# Patient Record
Sex: Female | Born: 1975
Health system: Southern US, Community
[De-identification: ages and names within clinical notes are randomized; demographics above are authoritative.]

## PROBLEM LIST (undated history)

## (undated) DIAGNOSIS — I10 Essential (primary) hypertension: Secondary | ICD-10-CM

## (undated) DIAGNOSIS — R7303 Prediabetes: Secondary | ICD-10-CM

## (undated) HISTORY — DX: Essential (primary) hypertension: I10

## (undated) HISTORY — PX: TUBAL LIGATION: SHX77

---

## 1998-02-16 ENCOUNTER — Other Ambulatory Visit: Admission: RE | Admit: 1998-02-16 | Discharge: 1998-02-16 | Payer: Self-pay | Admitting: Obstetrics and Gynecology

## 1998-04-10 ENCOUNTER — Other Ambulatory Visit: Admission: RE | Admit: 1998-04-10 | Discharge: 1998-04-10 | Payer: Self-pay | Admitting: Obstetrics and Gynecology

## 1998-10-19 ENCOUNTER — Other Ambulatory Visit: Admission: RE | Admit: 1998-10-19 | Discharge: 1998-10-19 | Payer: Self-pay | Admitting: Obstetrics and Gynecology

## 1999-03-06 ENCOUNTER — Inpatient Hospital Stay (HOSPITAL_COMMUNITY): Admission: AD | Admit: 1999-03-06 | Discharge: 1999-03-06 | Payer: Self-pay | Admitting: Obstetrics and Gynecology

## 1999-05-02 ENCOUNTER — Inpatient Hospital Stay (HOSPITAL_COMMUNITY): Admission: AD | Admit: 1999-05-02 | Discharge: 1999-05-02 | Payer: Self-pay | Admitting: Obstetrics and Gynecology

## 1999-05-17 ENCOUNTER — Encounter (INDEPENDENT_AMBULATORY_CARE_PROVIDER_SITE_OTHER): Payer: Self-pay | Admitting: Specialist

## 1999-05-17 ENCOUNTER — Inpatient Hospital Stay (HOSPITAL_COMMUNITY): Admission: AD | Admit: 1999-05-17 | Discharge: 1999-05-19 | Payer: Self-pay | Admitting: Obstetrics and Gynecology

## 1999-05-21 ENCOUNTER — Encounter (HOSPITAL_COMMUNITY): Admission: RE | Admit: 1999-05-21 | Discharge: 1999-08-19 | Payer: Self-pay | Admitting: Obstetrics & Gynecology

## 1999-06-21 ENCOUNTER — Other Ambulatory Visit: Admission: RE | Admit: 1999-06-21 | Discharge: 1999-06-21 | Payer: Self-pay | Admitting: Obstetrics and Gynecology

## 2000-07-21 ENCOUNTER — Other Ambulatory Visit: Admission: RE | Admit: 2000-07-21 | Discharge: 2000-07-21 | Payer: Self-pay | Admitting: Obstetrics and Gynecology

## 2001-03-22 ENCOUNTER — Other Ambulatory Visit: Admission: RE | Admit: 2001-03-22 | Discharge: 2001-03-22 | Payer: Self-pay | Admitting: Obstetrics and Gynecology

## 2001-07-22 ENCOUNTER — Ambulatory Visit (HOSPITAL_COMMUNITY): Admission: RE | Admit: 2001-07-22 | Discharge: 2001-07-22 | Payer: Self-pay | Admitting: Obstetrics and Gynecology

## 2001-10-05 ENCOUNTER — Inpatient Hospital Stay (HOSPITAL_COMMUNITY): Admission: AD | Admit: 2001-10-05 | Discharge: 2001-10-08 | Payer: Self-pay | Admitting: Obstetrics and Gynecology

## 2001-10-05 ENCOUNTER — Encounter (INDEPENDENT_AMBULATORY_CARE_PROVIDER_SITE_OTHER): Payer: Self-pay | Admitting: Specialist

## 2001-12-07 ENCOUNTER — Other Ambulatory Visit: Admission: RE | Admit: 2001-12-07 | Discharge: 2001-12-07 | Payer: Self-pay | Admitting: Obstetrics and Gynecology

## 2002-05-26 ENCOUNTER — Encounter: Admission: RE | Admit: 2002-05-26 | Discharge: 2002-05-26 | Payer: Self-pay | Admitting: Occupational Medicine

## 2002-05-26 ENCOUNTER — Encounter: Payer: Self-pay | Admitting: Occupational Medicine

## 2002-07-11 ENCOUNTER — Encounter: Admission: RE | Admit: 2002-07-11 | Discharge: 2002-09-05 | Payer: Self-pay | Admitting: Orthopedic Surgery

## 2002-12-20 ENCOUNTER — Other Ambulatory Visit: Admission: RE | Admit: 2002-12-20 | Discharge: 2002-12-20 | Payer: Self-pay | Admitting: Obstetrics and Gynecology

## 2004-01-10 ENCOUNTER — Other Ambulatory Visit: Admission: RE | Admit: 2004-01-10 | Discharge: 2004-01-10 | Payer: Self-pay | Admitting: Obstetrics and Gynecology

## 2004-10-16 ENCOUNTER — Other Ambulatory Visit: Admission: RE | Admit: 2004-10-16 | Discharge: 2004-10-16 | Payer: Self-pay | Admitting: Obstetrics and Gynecology

## 2004-11-12 ENCOUNTER — Ambulatory Visit: Payer: Self-pay | Admitting: Internal Medicine

## 2005-04-15 ENCOUNTER — Other Ambulatory Visit: Admission: RE | Admit: 2005-04-15 | Discharge: 2005-04-15 | Payer: Self-pay | Admitting: Obstetrics and Gynecology

## 2005-07-01 ENCOUNTER — Ambulatory Visit: Payer: Self-pay | Admitting: Internal Medicine

## 2005-09-10 ENCOUNTER — Ambulatory Visit: Payer: Self-pay | Admitting: Internal Medicine

## 2005-11-03 ENCOUNTER — Ambulatory Visit: Payer: Self-pay | Admitting: Internal Medicine

## 2005-12-10 ENCOUNTER — Other Ambulatory Visit: Admission: RE | Admit: 2005-12-10 | Discharge: 2005-12-10 | Payer: Self-pay | Admitting: Obstetrics and Gynecology

## 2006-07-26 ENCOUNTER — Emergency Department (HOSPITAL_COMMUNITY): Admission: EM | Admit: 2006-07-26 | Discharge: 2006-07-26 | Payer: Self-pay | Admitting: Emergency Medicine

## 2006-07-30 ENCOUNTER — Ambulatory Visit (HOSPITAL_COMMUNITY): Admission: RE | Admit: 2006-07-30 | Discharge: 2006-07-30 | Payer: Self-pay | Admitting: Urology

## 2006-07-30 ENCOUNTER — Emergency Department (HOSPITAL_COMMUNITY): Admission: EM | Admit: 2006-07-30 | Discharge: 2006-07-31 | Payer: Self-pay | Admitting: Emergency Medicine

## 2006-08-07 ENCOUNTER — Ambulatory Visit (HOSPITAL_COMMUNITY): Admission: RE | Admit: 2006-08-07 | Discharge: 2006-08-07 | Payer: Self-pay | Admitting: Urology

## 2007-10-30 ENCOUNTER — Emergency Department (HOSPITAL_COMMUNITY): Admission: EM | Admit: 2007-10-30 | Discharge: 2007-10-30 | Payer: Self-pay | Admitting: Emergency Medicine

## 2009-11-06 ENCOUNTER — Emergency Department (HOSPITAL_COMMUNITY): Admission: EM | Admit: 2009-11-06 | Discharge: 2009-11-06 | Payer: Self-pay | Admitting: Emergency Medicine

## 2009-12-05 ENCOUNTER — Emergency Department (HOSPITAL_COMMUNITY): Admission: EM | Admit: 2009-12-05 | Discharge: 2009-12-05 | Payer: Self-pay | Admitting: Emergency Medicine

## 2010-07-02 LAB — HM PAP SMEAR: HM Pap smear: NORMAL

## 2010-11-18 ENCOUNTER — Emergency Department (HOSPITAL_COMMUNITY): Payer: Self-pay

## 2010-11-18 ENCOUNTER — Emergency Department (HOSPITAL_COMMUNITY)
Admission: EM | Admit: 2010-11-18 | Discharge: 2010-11-18 | Disposition: A | Discharge status: Home / Self Care | Payer: Self-pay | Attending: Emergency Medicine | Admitting: Emergency Medicine

## 2010-11-18 DIAGNOSIS — S93409A Sprain of unspecified ligament of unspecified ankle, initial encounter: Secondary | ICD-10-CM | POA: Insufficient documentation

## 2010-11-18 DIAGNOSIS — X500XXA Overexertion from strenuous movement or load, initial encounter: Secondary | ICD-10-CM | POA: Insufficient documentation

## 2010-11-18 DIAGNOSIS — I10 Essential (primary) hypertension: Secondary | ICD-10-CM | POA: Insufficient documentation

## 2010-11-18 DIAGNOSIS — M25579 Pain in unspecified ankle and joints of unspecified foot: Secondary | ICD-10-CM | POA: Insufficient documentation

## 2010-11-18 DIAGNOSIS — Y92009 Unspecified place in unspecified non-institutional (private) residence as the place of occurrence of the external cause: Secondary | ICD-10-CM | POA: Insufficient documentation

## 2011-02-07 NOTE — Op Note (Signed)
Jenny Parsons, Jenny Parsons          ACCOUNT NO.:  [id number]   MEDICAL RECORD NO.:  [id number]          PATIENT TYPE:  AMB   LOCATION:  DAY                          FACILITY:  [facility]   PHYSICIAN:  Lindaann Slough, M.D.  DATE OF BIRTH:  11/29/1975   DATE OF PROCEDURE:  08/07/2006  DATE OF DISCHARGE:                               OPERATIVE REPORT   PREOPERATIVE DIAGNOSIS:  Right ureteral stones.   POSTOP DIAGNOSIS:  Right ureteral stones.   PROCEDURE:  Cystoscopy, ureteroscopy, Holmium laser right ureteral  stone, stone extraction, retrograde pyelogram, and insertion of double-J  catheter.   SURGEON:  Dr. Brunilda Payor.   ANESTHESIA:  General.   DATE OF PROCEDURE:  August 07, 2006.   INDICATIONS:  The patient is 66 year of female who had ESL of a right  UPJ stone on 26-Aug-2006.  She has passed several small stone  fragments, however, she has been complaining of right flank and right  lower quadrant pain.  She has been taking analgesics on a daily basis  and cannot go back to work.  KUB shows a stone fragment at the right UVJ  and another stone fragment at the UPJ.  KUB done this morning showed a  stone fragment at the UVJ and the stone at the UPJ has migrated down to  the level of the upper cecum.  She is scheduled for cystoscopy and stone  manipulation.   Under general anesthesia the patient was prepped and draped and placed  in the dorsal lithotomy position.  A #22 WAPPLER cystoscope was inserted  in the bladder.  There is some edema at the right UVJ.  There is no  stone or tumor in the bladder.  An open-ended catheter was passed  through the cystoscope and a glide wire was passed through the open-  ended catheter into the ureter.  The cystoscope was then removed.  The  glide wire was used as a safety wire. A number 6.5 French rigid  ureteroscope was then inserted in the bladder, but could not be passed  through the ureteral orifice.  The ureteroscope was then removed.  The  intramural ureter was then dilated with the inner sheath of the  ureteroscope access sheath.  The ureteroscope access sheath was then  removed.  The ureteroscope was then reinserted in the bladder and passed  without difficulty in the ureter.  There is a stone in the distal  ureter. The stone was removed with the nitinol stone basket.  The  ureteroscope was then passed up to the mid ureter and the proximal stone  was visualized and then fragmented in multiple stone fragments.  Several  stone fragments were removed with the nitinol basket.   RETROGRADE PYELOGRAM:  Contrast was then injected through the  ureteroscope and the calices and proximal ureter are mildly dilated.  There is no evidence of extravasation of contrast.  There are some small  filling defects in the mid ureter that represents some small stone  fragments.  With the nitinol basket several minute fragments were  removed.   The ureteroscope was then removed.  During the removal of  the  ureteroscope the glide wire came out of the ureter.  The cystoscope was  then inserted in the bladder.  The open-ended catheter was passed  through the cystoscope and into the right ureteral orifice and then the  Glidewire was passed through the open-ended catheter up into the renal  pelvis.  The open-ended catheter was advanced over the Glidewire into  the renal pelvis and the Glidewire was removed and replaced with a guide  wire.  The open-ended catheter was then removed.  A #6-French - 24  double-J catheter was then passed over the guidewire.  The proximal curl  of the double-J catheter is in the renal pelvis.  The distal curl is in  the bladder.  The bladder was then emptied and the cystoscope and guide  wire were removed.   The patient tolerated the procedure well and left the OR in satisfactory  condition to the post anesthesia care unit.      Lindaann Slough, M.D.  Electronically Signed     MN/MEDQ  D:  08/07/2006  T:   08/07/2006  Job:  [id number]

## 2011-02-07 NOTE — Discharge Summary (Signed)
[facility]  Patient:    Jenny Parsons, Jenny Parsons Visit Number: [id number] MRN: [id number]          Service Type: OBS Location: 9300 [id number] Attending Physician:  Frederich Balding Dictated by:   Danie Chandler, R.N. Admit Date:  10/05/2001 Discharge Date: 10/08/2001                             Discharge Summary  ADMISSION DIAGNOSES:          1. Intrauterine pregnancy at [redacted] weeks                                  gestation.                               2. Desires primary cesarean section.                               3. Past history of infant with Group B Strep                                  sepsis and meconium aspiration.                               4. Multiparity, desires sterility.  DISCHARGE DIAGNOSES:          1. Intrauterine pregnancy at [redacted] weeks                                  gestation.                               2. Desires primary cesarean section.                               3. Past history of infant with Group B Strep                                  sepsis and meconium aspiration.                               4. Multiparity, desires sterility.  PROCEDURE October 05, 2001:   1. Primary low transverse cesarean section.                               2. Bilateral tubal ligation.  REASON FOR ADMISSION:         Please see dictated H&P.  HOSPITAL COURSE:             The patient was taken to the operating room and underwent the above-named procedure without complications.  This was productive of a viable female infant with Apgars of 9 at one minute and 9 at five minute and an arterial cord pH of 7.34.  Postoperatively, on day #1, the patient was without complaints.  She had good pain control and good return of bowel function.  Her hemoglobin was 10.1, hematocrit 28.6 and white blood cell count 8.9.  On postoperative day #2, she was ambulating well without difficulty and tolerating a regular diet and she was discharged home  on postoperative day #3.  CONDITION ON DISCHARGE:       Good.  DIET:                         Regular as tolerated.  ACTIVITY:                     No heavy lifting, no driving, no vaginal entry.  FOLLOW-UP:                    She is to follow up in the office in 1-2 weeks for incision check and she is to call for temperature greater than 100 degrees, persistent nausea or vomiting, heavy vaginal bleeding and/or redness or drainage from incision site.  DISCHARGE MEDICATIONS:        1. Prenatal vitamin, one p.o. q.d.                               2. Pain medications as directed by M.D.Dictated by:   Danie Chandler, R.N. Attending Physician:  Frederich Balding DD:  10/08/01 TD:  10/10/01 Job: [id number]

## 2011-02-07 NOTE — H&P (Signed)
[facility]  Patient:    Jenny Parsons, Jenny Parsons Visit Number: [id number] MRN: [id number]          Service Type: Attending:  Juluis Mire, M.D. Dictated by:   Juluis Mire, M.D. Adm. Date:  10/05/01                           History and Physical  CHIEF COMPLAINT:              This is a 35 year old gravida 2 para 1 married white female, last menstrual period January 04, 2001, giving her an estimated date of confinement of October 11, 2001.  This gives her an estimated gestational age of [redacted] weeks.  This is consistent with early initial examination as well as early ultrasound.  She presents now for primary cesarean section.  HISTORY OF PRESENT ILLNESS:   In relation to the present admission, the last pregnancy was complicated by the fact that the baby had group B sepsis and meconium aspiration.  The infant was transferred to [facility] and did require ECMO.  The infant has done well since.  Because of this problem she desires primary cesarean section.  We offered the option of trial of labor, which she adamantly declines.  She is also desirous of permanent sterilization.  Alternative forms of birth control have been discussed.  The potential irreversibility of sterilization was explained.  A failure rate of 1:200 was quoted.  Failures can be in the form of ectopic pregnancy requiring further surgical management.  ALLERGIES:                    No known drug allergies.  MEDICATIONS:                  Prenatal vitamins.  PAST MEDICAL HISTORY/FAMILY HISTORY/SOCIAL HISTORY:       Please see prenatal records.  REVIEW OF SYSTEMS:            Noncontributory.  PHYSICAL EXAMINATION:  VITAL SIGNS:                  The patient is afebrile with stable vital signs.  HEENT:                        Normocephalic.  PERRLA.  EOMI.  Sclerae and conjunctivae clear.  Oropharynx clear.  NECK:                         Without  thyromegaly.  BREAST:                       No discrete masses but glandular.  LUNGS:                        Clear.  CARDIOVASCULAR:               Regular rate with grade 2/6 systolic ejection murmur.  No clicks of gallops.  ABDOMEN:                      Gravid uterus consistent with dates.  Fetal heart tones audible.  PELVIC:                       Cervix is long and closed.  EXTREMITIES:  Trace edema.  NEUROLOGIC:                   Grossly within normal limits.  IMPRESSION:                   1. Intrauterine pregnancy at 39+ weeks.  The                                  patient desires primary cesarean section.                               2. History with prior pregnancy of group B                                  streptococci sepsis and meconium aspiration.                               3. Multiparity.  Desires sterility.  PLAN:                         The patient is to undergo primary cesarean section at her request.  The risks have been discussed including the risks of anesthesia, the risks of infection, the risk of hemorrhage that could require transfusion with the risk of AIDS or hepatitis, the risk of injury to adjacent organs including bladder, bowel or ureters that could require further exploratory surgery, risk of deep vein thrombosis and pulmonary embolus.  The patient expressed understanding of the indications and risks and continues to be desirous of permanent sterilization. Dictated by:   Juluis Mire, M.D. Attending:  Juluis Mire, M.D. DD:  10/05/01 TD:  10/05/01 Job: [id number]

## 2011-02-07 NOTE — Op Note (Signed)
[facility]  Patient:    Jenny Parsons, Jenny Parsons Visit Number: [id number] MRN: [id number]          Service Type: OBS Location: 9300 [id number] Attending Physician:  Frederich Balding Dictated by:   Juluis Mire, M.D. Proc. Date: 10/05/01 Admit Date:  10/05/2001                             Operative Report  PREOPERATIVE DIAGNOSES:       1. Intrauterine pregnancy at 39 weeks.                               2. Desires primary cesarean section.                               3. Past history of infant with group B strep                                  sepsis and meconium aspiration.                               4. Multiparity, desires sterility.  POSTOPERATIVE DIAGNOSES:      1. Intrauterine pregnancy at 39 weeks.                               2. Desires primary cesarean section.                               3. Past history of infant with group B strep                                  sepsis and meconium aspiration.                               4. Multiparity, desires sterility.  OPERATIVE PROCEDURES:         1. Primary low transverse cesarean section.                               2. Bilateral tubal ligation.  SURGEON:                      Juluis Mire, M.D.  ANESTHESIA:                   Spinal.  ESTIMATED BLOOD LOSS:         800 cc.  PACKS AND DRAINS:             None.  INTRAOPERATIVE BLOOD REPLACED:               None.  COMPLICATIONS:                None.  INDICATIONS:                  Dictated in the history and physical.  DESCRIPTION OF PROCEDURE:  The patient was taken to the OR and placed in the supine position with a left lateral tilt.  After a satisfactory level of spinal anesthesia was obtained, the abdomen was prepped out with Betadine and draped as a sterile field.  A low transverse skin incision was made with a knife and carried through the subcutaneous tissue.  The anterior rectus fascia was entered sharply and the  incision in the fascia extended laterally.  The fascia was taken off of the muscle superiorly and inferiorly.  The rectus muscles were separated in the midline.  The peritoneum was entered sharply. The incision in the peritoneum was extended both superiorly and inferiorly.  A low transverse bladder flap was developed.  A low transverse uterine incision was begun with the knife and extended laterally using manual traction.  There was very light-stained meconium fluid.  It was very thin.  The infant was delivered with elevation of the had and fundal pressure.  The pediatric team was in attendance.  The infant was  viable female who weighed 6 lb 11 oz. Apgars were 9/9 and pH was 7.34.  The placenta was delivered manually and sent for pathologic review.  The uterus was exteriorized for closure.  The uterus was closed with interlocking suture of 0 chromic using a two-layer closure technique.  We had fair hemostasis.  Some bleeding was brought under control with figure-of-eight of 0 chromic.  At this point in time, we proceeded with the tubal.  Both tubes were identified.  A midsegment was elevated.  A hole was made in an avascular area of the mesosalpinx. Individual ligatures of 0 plain catgut were used to ligate off a segment of tube.  The intervening segment of tube was excised.  The cut ends of the tubes were cauterized using the Bovie.  We had good hemostasis bilaterally.  The ovaries were unremarkable.  The uterus was returned to the abdominal cavity. The pelvic cavity was thoroughly irrigated.  Hemostasis was excellent.  At this point in time, the peritoneum and muscles were closed with running suture of 3-0 Vicryl.  The fascia was closed with a running suture of 0 PDS.  The skin was closed with staples and Steri-Strips.  Sponge, needle and instrument counts were reported as correct by the circulating nurse x 2.  The Foley catheter was clear at the time of closure.  The patient tolerated  the procedure well and was returned to the recovery room in good condition. Dictated by:   Juluis Mire, M.D. Attending Physician:  Frederich Balding DD:  10/05/01 TD:  10/05/01 Job: [id number]

## 2011-05-01 DIAGNOSIS — I10 Essential (primary) hypertension: Secondary | ICD-10-CM | POA: Insufficient documentation

## 2011-11-19 ENCOUNTER — Ambulatory Visit: Payer: Self-pay | Admitting: Internal Medicine

## 2011-12-22 ENCOUNTER — Ambulatory Visit (INDEPENDENT_AMBULATORY_CARE_PROVIDER_SITE_OTHER)
Admission: RE | Admit: 2011-12-22 | Discharge: 2011-12-22 | Disposition: A | Discharge status: Home / Self Care | Payer: 59 | Source: Ambulatory Visit | Attending: Internal Medicine | Admitting: Internal Medicine

## 2011-12-22 ENCOUNTER — Ambulatory Visit (INDEPENDENT_AMBULATORY_CARE_PROVIDER_SITE_OTHER): Payer: 59 | Admitting: Internal Medicine

## 2011-12-22 ENCOUNTER — Other Ambulatory Visit (INDEPENDENT_AMBULATORY_CARE_PROVIDER_SITE_OTHER): Payer: 59

## 2011-12-22 ENCOUNTER — Encounter: Payer: Self-pay | Admitting: Internal Medicine

## 2011-12-22 VITALS — BP 132/96 | HR 74 | Temp 98.4°F | Resp 16 | Ht 60.0 in | Wt 220.0 lb

## 2011-12-22 DIAGNOSIS — M25569 Pain in unspecified knee: Secondary | ICD-10-CM

## 2011-12-22 DIAGNOSIS — Z0000 Encounter for general adult medical examination without abnormal findings: Secondary | ICD-10-CM

## 2011-12-22 DIAGNOSIS — M25561 Pain in right knee: Secondary | ICD-10-CM

## 2011-12-22 DIAGNOSIS — I10 Essential (primary) hypertension: Secondary | ICD-10-CM

## 2011-12-22 DIAGNOSIS — J019 Acute sinusitis, unspecified: Secondary | ICD-10-CM

## 2011-12-22 LAB — LIPID PANEL
Cholesterol: 184 mg/dL (ref 0–200)
HDL: 55.2 mg/dL (ref >39.00)
LDL Cholesterol: 108 mg/dL — ABNORMAL HIGH (ref 0–99)
Total CHOL/HDL Ratio: 3
Triglycerides: 104 mg/dL (ref 0.0–149.0)

## 2011-12-22 LAB — CBC WITH DIFFERENTIAL/PLATELET
Basophils Absolute: 0 10*3/uL (ref 0.0–0.1)
Basophils Relative: 0.5 % (ref 0.0–3.0)
HCT: 38.3 % (ref 36.0–46.0)
Hemoglobin: 12.9 g/dL (ref 12.0–15.0)
Lymphocytes Relative: 27.4 % (ref 12.0–46.0)
Lymphs Abs: 2.2 10*3/uL (ref 0.7–4.0)
MCHC: 33.8 g/dL (ref 30.0–36.0)
Monocytes Relative: 5.5 % (ref 3.0–12.0)
Neutro Abs: 5.2 10*3/uL (ref 1.4–7.7)
RBC: 4.37 Mil/uL (ref 3.87–5.11)
RDW: 12.9 % (ref 11.5–14.6)

## 2011-12-22 LAB — URINALYSIS, ROUTINE W REFLEX MICROSCOPIC
Bilirubin Urine: NEGATIVE
Hgb urine dipstick: NEGATIVE
Leukocytes, UA: NEGATIVE
Nitrite: NEGATIVE
Urobilinogen, UA: 0.2 (ref 0.0–1.0)

## 2011-12-22 LAB — COMPREHENSIVE METABOLIC PANEL
ALT: 16 U/L (ref 0–35)
AST: 19 U/L (ref 0–37)
BUN: 15 mg/dL (ref 6–23)
Calcium: 8.9 mg/dL (ref 8.4–10.5)
Creatinine, Ser: 0.6 mg/dL (ref 0.4–1.2)
GFR: 119.59 mL/min (ref >60.00)
Total Bilirubin: 0.3 mg/dL (ref 0.3–1.2)

## 2011-12-22 MED ORDER — AMOXICILLIN 875 MG PO TABS: 875.0000 mg | ORAL_TABLET | Freq: Two times a day (BID) | ORAL | Status: AC

## 2011-12-22 MED ORDER — OLMESARTAN MEDOXOMIL 40 MG PO TABS: 40.0000 mg | ORAL_TABLET | Freq: Every day | ORAL | Status: DC

## 2011-12-22 NOTE — Assessment & Plan Note (Signed)
She will continue to treat any discomfort with OTC meds, today I will check a plain xray to look for occult fracture, spurring, djd, etc. She will consider starting PT.

## 2011-12-22 NOTE — Patient Instructions (Signed)
Hypertension As your heart beats, it forces blood through your arteries. This force is your blood pressure. If the pressure is too high, it is called hypertension (HTN) or high blood pressure. HTN is dangerous because you may have it and not know it. High blood pressure may mean that your heart has to work harder to pump blood. Your arteries may be narrow or stiff. The extra work puts you at risk for heart disease, stroke, and other problems.  Blood pressure consists of two numbers, a higher number over a lower, 110/72, for example. It is stated as "110 over 72." The ideal is below 120 for the top number (systolic) and under 80 for the bottom (diastolic). Write down your blood pressure today. You should pay close attention to your blood pressure if you have certain conditions such as:  Heart failure.   Prior heart attack.   Diabetes   Chronic kidney disease.   Prior stroke.   Multiple risk factors for heart disease.  To see if you have HTN, your blood pressure should be measured while you are seated with your arm held at the level of the heart. It should be measured at least twice. A one-time elevated blood pressure reading (especially in the Emergency Department) does not mean that you need treatment. There may be conditions in which the blood pressure is different between your right and left arms. It is important to see your caregiver soon for a recheck. Most people have essential hypertension which means that there is not a specific cause. This type of high blood pressure may be lowered by changing lifestyle factors such as:  Stress.   Smoking.   Lack of exercise.   Excessive weight.   Drug/tobacco/alcohol use.   Eating less salt.  Most people do not have symptoms from high blood pressure until it has caused damage to the body. Effective treatment can often prevent, delay or reduce that damage. TREATMENT  When a cause has been identified, treatment for high blood pressure is  directed at the cause. There are a large number of medications to treat HTN. These fall into several categories, and your caregiver will help you select the medicines that are best for you. Medications may have side effects. You should review side effects with your caregiver. If your blood pressure stays high after you have made lifestyle changes or started on medicines,   Your medication(s) may need to be changed.   Other problems may need to be addressed.   Be certain you understand your prescriptions, and know how and when to take your medicine.   Be sure to follow up with your caregiver within the time frame advised (usually within two weeks) to have your blood pressure rechecked and to review your medications.   If you are taking more than one medicine to lower your blood pressure, make sure you know how and at what times they should be taken. Taking two medicines at the same time can result in blood pressure that is too low.  SEEK IMMEDIATE MEDICAL CARE IF:  You develop a severe headache, blurred or changing vision, or confusion.   You have unusual weakness or numbness, or a faint feeling.   You have severe chest or abdominal pain, vomiting, or breathing problems.  MAKE SURE YOU:   Understand these instructions.   Will watch your condition.   Will get help right away if you are not doing well or get worse.  Document Released: 09/08/2005 Document Revised: 08/28/2011 Document Reviewed:   04/28/2008 [employer] Patient Information 2012 [employer].Preventive Care for Adults, Female A healthy lifestyle and preventive care can promote health and wellness. Preventive health guidelines for women include the following key practices.  A routine yearly physical is a good way to check with your caregiver about your health and preventive screening. It is a chance to share any concerns and updates on your health, and to receive a thorough exam.   Visit your dentist for a routine exam and  preventive care every 6 months. Brush your teeth twice a day and floss once a day. Good oral hygiene prevents tooth decay and gum disease.   The frequency of eye exams is based on your age, health, family medical history, use of contact lenses, and other factors. Follow your caregiver's recommendations for frequency of eye exams.   Eat a healthy diet. Foods like vegetables, fruits, whole grains, low-fat dairy products, and lean protein foods contain the nutrients you need without too many calories. Decrease your intake of foods high in solid fats, added sugars, and salt. Eat the right amount of calories for you.Get information about a proper diet from your caregiver, if necessary.   Regular physical exercise is one of the most important things you can do for your health. Most adults should get at least 150 minutes of moderate-intensity exercise (any activity that increases your heart rate and causes you to sweat) each week. In addition, most adults need muscle-strengthening exercises on 2 or more days a week.   Maintain a healthy weight. The body mass index (BMI) is a screening tool to identify possible weight problems. It provides an estimate of body fat based on height and weight. Your caregiver can help determine your BMI, and can help you achieve or maintain a healthy weight.For adults 20 years and older:   A BMI below 18.5 is considered underweight.   A BMI of 18.5 to 24.9 is normal.   A BMI of 25 to 29.9 is considered overweight.   A BMI of 30 and above is considered obese.   Maintain normal blood lipids and cholesterol levels by exercising and minimizing your intake of saturated fat. Eat a balanced diet with plenty of fruit and vegetables. Blood tests for lipids and cholesterol should begin at age 20 and be repeated every 5 years. If your lipid or cholesterol levels are high, you are over 50, or you are at high risk for heart disease, you may need your cholesterol levels checked more  frequently.Ongoing high lipid and cholesterol levels should be treated with medicines if diet and exercise are not effective.   If you smoke, find out from your caregiver how to quit. If you do not use tobacco, do not start.   If you are pregnant, do not drink alcohol. If you are breastfeeding, be very cautious about drinking alcohol. If you are not pregnant and choose to drink alcohol, do not exceed 1 drink per day. One drink is considered to be 12 ounces (355 mL) of beer, 5 ounces (148 mL) of wine, or 1.5 ounces (44 mL) of liquor.   Avoid use of street drugs. Do not share needles with anyone. Ask for help if you need support or instructions about stopping the use of drugs.   High blood pressure causes heart disease and increases the risk of stroke. Your blood pressure should be checked at least every 1 to 2 years. Ongoing high blood pressure should be treated with medicines if weight loss and exercise are not effective.     If you are 55 to 34 years old, ask your caregiver if you should take aspirin to prevent strokes.   Diabetes screening involves taking a blood sample to check your fasting blood sugar level. This should be done once every 3 years, after age 45, if you are within normal weight and without risk factors for diabetes. Testing should be considered at a younger age or be carried out more frequently if you are overweight and have at least 1 risk factor for diabetes.   Breast cancer screening is essential preventive care for women. You should practice "breast self-awareness." This means understanding the normal appearance and feel of your breasts and may include breast self-examination. Any changes detected, no matter how small, should be reported to a caregiver. Women in their 20s and 30s should have a clinical breast exam (CBE) by a caregiver as part of a regular health exam every 1 to 3 years. After age 40, women should have a CBE every year. Starting at age 40, women should consider  having a mammography (breast X-ray test) every year. Women who have a family history of breast cancer should talk to their caregiver about genetic screening. Women at a high risk of breast cancer should talk to their caregivers about having magnetic resonance imaging (MRI) and a mammography every year.   The Pap test is a screening test for cervical cancer. A Pap test can show cell changes on the cervix that might become cervical cancer if left untreated. A Pap test is a procedure in which cells are obtained and examined from the lower end of the uterus (cervix).   Women should have a Pap test starting at age 21.   Between ages 21 and 29, Pap tests should be repeated every 2 years.   Beginning at age 30, you should have a Pap test every 3 years as long as the past 3 Pap tests have been normal.   Some women have medical problems that increase the chance of getting cervical cancer. Talk to your caregiver about these problems. It is especially important to talk to your caregiver if a new problem develops soon after your last Pap test. In these cases, your caregiver may recommend more frequent screening and Pap tests.   The above recommendations are the same for women who have or have not gotten the vaccine for human papillomavirus (HPV).   If you had a hysterectomy for a problem that was not cancer or a condition that could lead to cancer, then you no longer need Pap tests. Even if you no longer need a Pap test, a regular exam is a good idea to make sure no other problems are starting.   If you are between ages 65 and 70, and you have had normal Pap tests going back 10 years, you no longer need Pap tests. Even if you no longer need a Pap test, a regular exam is a good idea to make sure no other problems are starting.   If you have had past treatment for cervical cancer or a condition that could lead to cancer, you need Pap tests and screening for cancer for at least 20 years after your treatment.    If Pap tests have been discontinued, risk factors (such as a new sexual partner) need to be reassessed to determine if screening should be resumed.   The HPV test is an additional test that may be used for cervical cancer screening. The HPV test looks for the virus that can cause   the cell changes on the cervix. The cells collected during the Pap test can be tested for HPV. The HPV test could be used to screen women aged 30 years and older, and should be used in women of any age who have unclear Pap test results. After the age of 30, women should have HPV testing at the same frequency as a Pap test.   Colorectal cancer can be detected and often prevented. Most routine colorectal cancer screening begins at the age of 50 and continues through age 75. However, your caregiver may recommend screening at an earlier age if you have risk factors for colon cancer. On a yearly basis, your caregiver may provide home test kits to check for hidden blood in the stool. Use of a small camera at the end of a tube, to directly examine the colon (sigmoidoscopy or colonoscopy), can detect the earliest forms of colorectal cancer. Talk to your caregiver about this at age 50, when routine screening begins. Direct examination of the colon should be repeated every 5 to 10 years through age 75, unless early forms of pre-cancerous polyps or small growths are found.   Hepatitis C blood testing is recommended for all people born from 1945 through 1965 and any individual with known risks for hepatitis C.   Practice safe sex. Use condoms and avoid high-risk sexual practices to reduce the spread of sexually transmitted infections (STIs). STIs include gonorrhea, chlamydia, syphilis, trichomonas, herpes, HPV, and human immunodeficiency virus (HIV). Herpes, HIV, and HPV are viral illnesses that have no cure. They can result in disability, cancer, and death. Sexually active women aged 25 and younger should be checked for chlamydia. Older  women with new or multiple partners should also be tested for chlamydia. Testing for other STIs is recommended if you are sexually active and at increased risk.   Osteoporosis is a disease in which the bones lose minerals and strength with aging. This can result in serious bone fractures. The risk of osteoporosis can be identified using a bone density scan. Women ages 65 and over and women at risk for fractures or osteoporosis should discuss screening with their caregivers. Ask your caregiver whether you should take a calcium supplement or vitamin D to reduce the rate of osteoporosis.   Menopause can be associated with physical symptoms and risks. Hormone replacement therapy is available to decrease symptoms and risks. You should talk to your caregiver about whether hormone replacement therapy is right for you.   Use sunscreen with sun protection factor (SPF) of 30 or more. Apply sunscreen liberally and repeatedly throughout the day. You should seek shade when your shadow is shorter than you. Protect yourself by wearing long sleeves, pants, a wide-brimmed hat, and sunglasses year round, whenever you are outdoors.   Once a month, do a whole body skin exam, using a mirror to look at the skin on your back. Notify your caregiver of new moles, moles that have irregular borders, moles that are larger than a pencil eraser, or moles that have changed in shape or color.   Stay current with required immunizations.   Influenza. You need a dose every fall (or winter). The composition of the flu vaccine changes each year, so being vaccinated once is not enough.   Pneumococcal polysaccharide. You need 1 to 2 doses if you smoke cigarettes or if you have certain chronic medical conditions. You need 1 dose at age 65 (or older) if you have never been vaccinated.   Tetanus, diphtheria, pertussis (Tdap,   Td). Get 1 dose of Tdap vaccine if you are younger than age 65, are over 65 and have contact with an infant, are a  healthcare worker, are pregnant, or simply want to be protected from whooping cough. After that, you need a Td booster dose every 10 years. Consult your caregiver if you have not had at least 3 tetanus and diphtheria-containing shots sometime in your life or have a deep or dirty wound.   HPV. You need this vaccine if you are a woman age 26 or younger. The vaccine is given in 3 doses over 6 months.   Measles, mumps, rubella (MMR). You need at least 1 dose of MMR if you were born in 1957 or later. You may also need a second dose.   Meningococcal. If you are age 19 to 21 and a first-year college student living in a residence hall, or have one of several medical conditions, you need to get vaccinated against meningococcal disease. You may also need additional booster doses.   Zoster (shingles). If you are age 60 or older, you should get this vaccine.   Varicella (chickenpox). If you have never had chickenpox or you were vaccinated but received only 1 dose, talk to your caregiver to find out if you need this vaccine.   Hepatitis A. You need this vaccine if you have a specific risk factor for hepatitis A virus infection or you simply wish to be protected from this disease. The vaccine is usually given as 2 doses, 6 to 18 months apart.   Hepatitis B. You need this vaccine if you have a specific risk factor for hepatitis B virus infection or you simply wish to be protected from this disease. The vaccine is given in 3 doses, usually over 6 months.  Preventive Services / Frequency Ages 19 to 39  Blood pressure check.** / Every 1 to 2 years.   Lipid and cholesterol check.** / Every 5 years beginning at age 20.   Clinical breast exam.** / Every 3 years for women in their 20s and 30s.   Pap test.** / Every 2 years from ages 21 through 29. Every 3 years starting at age 30 through age 65 or 70 with a history of 3 consecutive normal Pap tests.   HPV screening.** / Every 3 years from ages 30 through ages 65  to 70 with a history of 3 consecutive normal Pap tests.   Hepatitis C blood test.** / For any individual with known risks for hepatitis C.   Skin self-exam. / Monthly.   Influenza immunization.** / Every year.   Pneumococcal polysaccharide immunization.** / 1 to 2 doses if you smoke cigarettes or if you have certain chronic medical conditions.   Tetanus, diphtheria, pertussis (Tdap, Td) immunization. / A one-time dose of Tdap vaccine. After that, you need a Td booster dose every 10 years.   HPV immunization. / 3 doses over 6 months, if you are 26 and younger.   Measles, mumps, rubella (MMR) immunization. / You need at least 1 dose of MMR if you were born in 1957 or later. You may also need a second dose.   Meningococcal immunization. / 1 dose if you are age 19 to 21 and a first-year college student living in a residence hall, or have one of several medical conditions, you need to get vaccinated against meningococcal disease. You may also need additional booster doses.   Varicella immunization.** / Consult your caregiver.   Hepatitis A immunization.** / Consult your caregiver.   2 doses, 6 to 18 months apart.   Hepatitis B immunization.** / Consult your caregiver. 3 doses usually over 6 months.  Ages 40 to 64  Blood pressure check.** / Every 1 to 2 years.   Lipid and cholesterol check.** / Every 5 years beginning at age 20.   Clinical breast exam.** / Every year after age 40.   Mammogram.** / Every year beginning at age 40 and continuing for as long as you are in good health. Consult with your caregiver.   Pap test.** / Every 3 years starting at age 30 through age 65 or 70 with a history of 3 consecutive normal Pap tests.   HPV screening.** / Every 3 years from ages 30 through ages 65 to 70 with a history of 3 consecutive normal Pap tests.   Fecal occult blood test (FOBT) of stool. / Every year beginning at age 50 and continuing until age 75. You may not need to do this test if you  get a colonoscopy every 10 years.   Flexible sigmoidoscopy or colonoscopy.** / Every 5 years for a flexible sigmoidoscopy or every 10 years for a colonoscopy beginning at age 50 and continuing until age 75.   Hepatitis C blood test.** / For all people born from 1945 through 1965 and any individual with known risks for hepatitis C.   Skin self-exam. / Monthly.   Influenza immunization.** / Every year.   Pneumococcal polysaccharide immunization.** / 1 to 2 doses if you smoke cigarettes or if you have certain chronic medical conditions.   Tetanus, diphtheria, pertussis (Tdap, Td) immunization.** / A one-time dose of Tdap vaccine. After that, you need a Td booster dose every 10 years.   Measles, mumps, rubella (MMR) immunization. / You need at least 1 dose of MMR if you were born in 1957 or later. You may also need a second dose.   Varicella immunization.** / Consult your caregiver.   Meningococcal immunization.** / Consult your caregiver.   Hepatitis A immunization.** / Consult your caregiver. 2 doses, 6 to 18 months apart.   Hepatitis B immunization.** / Consult your caregiver. 3 doses, usually over 6 months.  Ages 65 and over  Blood pressure check.** / Every 1 to 2 years.   Lipid and cholesterol check.** / Every 5 years beginning at age 20.   Clinical breast exam.** / Every year after age 40.   Mammogram.** / Every year beginning at age 40 and continuing for as long as you are in good health. Consult with your caregiver.   Pap test.** / Every 3 years starting at age 30 through age 65 or 70 with a 3 consecutive normal Pap tests. Testing can be stopped between 65 and 70 with 3 consecutive normal Pap tests and no abnormal Pap or HPV tests in the past 10 years.   HPV screening.** / Every 3 years from ages 30 through ages 65 or 70 with a history of 3 consecutive normal Pap tests. Testing can be stopped between 65 and 70 with 3 consecutive normal Pap tests and no abnormal Pap or HPV tests  in the past 10 years.   Fecal occult blood test (FOBT) of stool. / Every year beginning at age 50 and continuing until age 75. You may not need to do this test if you get a colonoscopy every 10 years.   Flexible sigmoidoscopy or colonoscopy.** / Every 5 years for a flexible sigmoidoscopy or every 10 years for a colonoscopy beginning at age 50 and continuing   until age 75.   Hepatitis C blood test.** / For all people born from 1945 through 1965 and any individual with known risks for hepatitis C.   Osteoporosis screening.** / A one-time screening for women ages 65 and over and women at risk for fractures or osteoporosis.   Skin self-exam. / Monthly.   Influenza immunization.** / Every year.   Pneumococcal polysaccharide immunization.** / 1 dose at age 65 (or older) if you have never been vaccinated.   Tetanus, diphtheria, pertussis (Tdap, Td) immunization. / A one-time dose of Tdap vaccine if you are over 65 and have contact with an infant, are a healthcare worker, or simply want to be protected from whooping cough. After that, you need a Td booster dose every 10 years.   Varicella immunization.** / Consult your caregiver.   Meningococcal immunization.** / Consult your caregiver.   Hepatitis A immunization.** / Consult your caregiver. 2 doses, 6 to 18 months apart.   Hepatitis B immunization.** / Check with your caregiver. 3 doses, usually over 6 months.  ** Family history and personal history of risk and conditions may change your caregiver's recommendations. Document Released: 11/04/2001 Document Revised: 08/28/2011 Document Reviewed: 02/03/2011 [employer] Patient Information 2012 [employer]. 

## 2011-12-22 NOTE — Progress Notes (Signed)
Subjective:    Patient ID: Jenny Parsons, female    DOB: 11-21-1975, 36 y.o.   MRN: [id number]  Hypertension This is a chronic problem. The current episode started more than 1 year ago. The problem is unchanged. The problem is uncontrolled. Pertinent negatives include no anxiety, blurred vision, chest pain, headaches, malaise/fatigue, neck pain, orthopnea, palpitations, peripheral edema, PND, shortness of breath or sweats. Past treatments include nothing. Compliance problems include exercise and diet.   Sinusitis This is a new problem. The current episode started 1 to 4 weeks ago. The problem has been gradually worsening since onset. There has been no fever. She is experiencing no pain. Associated symptoms include ear pain, sinus pressure, sneezing and a sore throat. Pertinent negatives include no chills, congestion, coughing, diaphoresis, headaches, hoarse voice, neck pain, shortness of breath or swollen glands. Past treatments include nothing.  Arthritis Presents for initial visit. The disease course has been worsening. Onset time: hit her right knee 2 years ago on a door frame. She complains of pain and stiffness. She reports no joint swelling or joint warmth. Affected locations include the right knee. Her pain is at a severity of 2/10. Pertinent negatives include no diarrhea, dry eyes, dry mouth, dysuria, fatigue, fever, pain at night, pain while resting, rash, Raynaud's syndrome, uveitis or weight loss. Past treatments include acetaminophen and NSAIDs. The treatment provided moderate relief. Factors aggravating her arthritis include activity.      Review of Systems  Constitutional: Negative for fever, chills, weight loss, malaise/fatigue, diaphoresis, activity change, appetite change, fatigue and unexpected weight change.  HENT: Positive for ear pain, sore throat, sneezing and sinus pressure. Negative for hearing loss, nosebleeds, congestion, hoarse voice, facial swelling, rhinorrhea,  drooling, mouth sores, trouble swallowing, neck pain, neck stiffness, dental problem, voice change, postnasal drip, tinnitus and ear discharge.   Eyes: Negative.  Negative for blurred vision.  Respiratory: Negative for cough, choking, chest tightness, shortness of breath and stridor.   Cardiovascular: Negative for chest pain, palpitations, orthopnea, leg swelling and PND.  Gastrointestinal: Negative for nausea, abdominal pain, diarrhea, constipation, blood in stool, abdominal distention, anal bleeding and rectal pain.  Genitourinary: Negative.  Negative for dysuria.  Musculoskeletal: Positive for arthralgias (right knee), arthritis and stiffness. Negative for myalgias, back pain, joint swelling and gait problem.  Skin: Negative for color change, pallor, rash and wound.  Neurological: Negative for dizziness, tremors, seizures, syncope, facial asymmetry, speech difficulty, weakness, light-headedness, numbness and headaches.  Hematological: Negative for adenopathy. Does not bruise/bleed easily.  Psychiatric/Behavioral: Negative.        Objective:   Physical Exam  Vitals reviewed. Constitutional: She is oriented to person, place, and time. She appears well-developed and well-nourished. No distress.  HENT:  Head: Normocephalic and atraumatic. No trismus in the jaw.  Right Ear: Hearing, tympanic membrane, external ear and ear canal normal.  Left Ear: Hearing, external ear and ear canal normal.  Nose: Mucosal edema and rhinorrhea present. No nose lacerations, sinus tenderness, nasal deformity, septal deviation or nasal septal hematoma. No epistaxis.  No foreign bodies. Right sinus exhibits maxillary sinus tenderness. Right sinus exhibits no frontal sinus tenderness. Left sinus exhibits maxillary sinus tenderness. Left sinus exhibits no frontal sinus tenderness.  Mouth/Throat: Oropharynx is clear and moist and mucous membranes are normal. Mucous membranes are not pale, not dry and not cyanotic. No  uvula swelling. No oropharyngeal exudate, posterior oropharyngeal edema, posterior oropharyngeal erythema or tonsillar abscesses.  Eyes: Conjunctivae are normal. Right eye exhibits no discharge. Left eye  exhibits no discharge. No scleral icterus.  Neck: Normal range of motion. Neck supple. No JVD present. No tracheal deviation present. No thyromegaly present.  Cardiovascular: Normal rate, regular rhythm, normal heart sounds and intact distal pulses.  Exam reveals no gallop and no friction rub.   No murmur heard. Pulmonary/Chest: Effort normal and breath sounds normal. No stridor. No respiratory distress. She has no wheezes. She has no rales. She exhibits no tenderness.  Abdominal: Soft. Bowel sounds are normal. She exhibits no distension and no mass. There is no tenderness. There is no rebound and no guarding.  Musculoskeletal: Normal range of motion. She exhibits no edema and no tenderness.       Right knee: Normal. She exhibits normal range of motion, no swelling, no effusion, no ecchymosis, no deformity, no laceration, no erythema, normal alignment, no LCL laxity, normal patellar mobility and no bony tenderness. no tenderness found.  Lymphadenopathy:    She has no cervical adenopathy.  Neurological: She is oriented to person, place, and time.  Skin: Skin is warm and dry. No rash noted. She is not diaphoretic. No erythema. No pallor.  Psychiatric: She has a normal mood and affect. Her behavior is normal. Judgment and thought content normal.          Assessment & Plan:

## 2011-12-22 NOTE — Assessment & Plan Note (Signed)
Exam done, labs ordered, pt ed material was given 

## 2011-12-22 NOTE — Assessment & Plan Note (Signed)
She will start Benicar to control the BP, today I will check her labs to look for secondary causes of HTN and to look for end organ damage

## 2011-12-22 NOTE — Assessment & Plan Note (Signed)
Start amoxil for the infection 

## 2011-12-24 ENCOUNTER — Telehealth: Payer: Self-pay

## 2011-12-24 NOTE — Telephone Encounter (Signed)
Returned call//[facility]mov[facility] adving

## 2011-12-24 NOTE — Telephone Encounter (Signed)
normal

## 2011-12-24 NOTE — Telephone Encounter (Signed)
Patient called [facility] requesting xray results, please advise Thanks

## 2011-12-25 ENCOUNTER — Ambulatory Visit: Payer: Self-pay | Admitting: Internal Medicine

## 2011-12-26 ENCOUNTER — Telehealth: Payer: Self-pay

## 2011-12-26 NOTE — Telephone Encounter (Signed)
Probably not caused by benicar

## 2011-12-26 NOTE — Telephone Encounter (Signed)
Patient called stating that she was given benicar 40mg  on Monday. She c/o bilateral hand and foot swelling everyday this week. Please advise

## 2011-12-29 NOTE — Telephone Encounter (Signed)
Patient notified/[facility] advising per MD. Also stated need to call for appt if continued swelling

## 2012-01-08 ENCOUNTER — Ambulatory Visit (INDEPENDENT_AMBULATORY_CARE_PROVIDER_SITE_OTHER): Payer: 59 | Admitting: Internal Medicine

## 2012-01-08 ENCOUNTER — Encounter: Payer: Self-pay | Admitting: Internal Medicine

## 2012-01-08 VITALS — BP 124/80 | HR 76 | Temp 97.9°F | Resp 16 | Ht 61.0 in | Wt 219.0 lb

## 2012-01-08 DIAGNOSIS — I10 Essential (primary) hypertension: Secondary | ICD-10-CM

## 2012-01-08 MED ORDER — HYDROCHLOROTHIAZIDE 12.5 MG PO CAPS: 12.5000 mg | ORAL_CAPSULE | Freq: Every day | ORAL | Status: DC

## 2012-01-08 NOTE — Patient Instructions (Signed)
Edema Edema is an abnormal build-up of fluids in tissues. Because this is partly dependent on gravity (water flows to the lowest place), it is more common in the leg sand thighs (lower extremities). It is also common in the looser tissues, like around the eyes. Painless swelling of the feet and ankles is common and increases as a person ages. It may affect both legs and may include the calves or even thighs. When squeezed, the fluid may move out of the affected area and may leave a dent for a few moments. CAUSES   Prolonged standing or sitting in one place for extended periods of time. Movement helps pump tissue fluid into the veins, and absence of movement prevents this, resulting in edema.   Varicose veins. The valves in the veins do not work as well as they should. This causes fluid to leak into the tissues.   Fluid and salt overload.   Injury, burn, or surgery to the leg, ankle, or foot, may damage veins and allow fluid to leak out.   Sunburn damages vessels. Leaky vessels allow fluid to go out into the sunburned tissues.   Allergies (from insect bites or stings, medications or chemicals) cause swelling by allowing vessels to become leaky.   Protein in the blood helps keep fluid in your vessels. Low protein, as in malnutrition, allows fluid to leak out.   Hormonal changes, including pregnancy and menstruation, cause fluid retention. This fluid may leak out of vessels and cause edema.   Medications that cause fluid retention. Examples are sex hormones, blood pressure medications, steroid treatment, or anti-depressants.   Some illnesses cause edema, especially heart failure, kidney disease, or liver disease.   Surgery that cuts veins or lymph nodes, such as surgery done for the heart or for breast cancer, may result in edema.  DIAGNOSIS  Your caregiver is usually easily able to determine what is causing your swelling (edema) by simply asking what is wrong (getting a history) and examining  you (doing a physical). Sometimes x-rays, EKG (electrocardiogram or heart tracing), and blood work may be done to evaluate for underlying medical illness. TREATMENT  General treatment includes:  Leg elevation (or elevation of the affected body part).   Restriction of fluid intake.   Prevention of fluid overload.   Compression of the affected body part. Compression with elastic bandages or support stockings squeezes the tissues, preventing fluid from entering and forcing it back into the blood vessels.   Diuretics (also called water pills or fluid pills) pull fluid out of your body in the form of increased urination. These are effective in reducing the swelling, but can have side effects and must be used only under your caregiver's supervision. Diuretics are appropriate only for some types of edema.  The specific treatment can be directed at any underlying causes discovered. Heart, liver, or kidney disease should be treated appropriately. HOME CARE INSTRUCTIONS   Elevate the legs (or affected body part) above the level of the heart, while lying down.   Avoid sitting or standing still for prolonged periods of time.   Avoid putting anything directly under the knees when lying down, and do not wear constricting clothing or garters on the upper legs.   Exercising the legs causes the fluid to work back into the veins and lymphatic channels. This may help the swelling go down.   The pressure applied by elastic bandages or support stockings can help reduce ankle swelling.   A low-salt diet may help reduce fluid   retention and decrease the ankle swelling.   Take any medications exactly as prescribed.  SEEK MEDICAL CARE IF:  Your edema is not responding to recommended treatments. SEEK IMMEDIATE MEDICAL CARE IF:   You develop shortness of breath or chest pain.   You cannot breathe when you lay down; or if, while lying down, you have to get up and go to the window to get your breath.   You  are having increasing swelling without relief from treatment.   You develop a fever over 102 F (38.9 C).   You develop pain or redness in the areas that are swollen.   Tell your caregiver right away if you have gained 3 lb/1.4 kg in 1 day or 5 lb/2.3 kg in a week.  MAKE SURE YOU:   Understand these instructions.   Will watch your condition.   Will get help right away if you are not doing well or get worse.  Document Released: 09/08/2005 Document Revised: 08/28/2011 Document Reviewed: 04/26/2008 [employer] Patient Information 2012 [employer]. 

## 2012-01-11 NOTE — Progress Notes (Signed)
  Subjective:    Patient ID: Jenny Parsons, female    DOB: 08/26/1976, 36 y.o.   MRN: [id number]  Hypertension This is a chronic problem. The current episode started more than 1 year ago. The problem is unchanged. The problem is controlled. Associated symptoms include peripheral edema. Pertinent negatives include no anxiety, blurred vision, chest pain, headaches, neck pain, orthopnea, palpitations, PND, shortness of breath or sweats. Past treatments include angiotensin blockers. The current treatment provides significant improvement. Compliance problems include exercise and diet.       Review of Systems  Constitutional: Negative.   HENT: Negative.  Negative for neck pain.   Eyes: Negative for blurred vision.  Respiratory: Negative for cough, chest tightness, shortness of breath, wheezing and stridor.   Cardiovascular: Positive for leg swelling (and swelling in her arms and hands). Negative for chest pain, palpitations, orthopnea and PND.  Gastrointestinal: Negative.   Genitourinary: Negative.   Musculoskeletal: Negative.   Skin: Negative.   Neurological: Negative.  Negative for headaches.  Hematological: Negative.   Psychiatric/Behavioral: Negative.        Objective:   Physical Exam  Vitals reviewed. Constitutional: She is oriented to person, place, and time. She appears well-developed and well-nourished. No distress.  HENT:  Head: Normocephalic and atraumatic.  Mouth/Throat: Oropharynx is clear and moist. No oropharyngeal exudate.  Eyes: Conjunctivae are normal. Right eye exhibits no discharge. Left eye exhibits no discharge. No scleral icterus.  Neck: Normal range of motion. Neck supple. No JVD present. No tracheal deviation present. No thyromegaly present.  Cardiovascular: Normal rate, regular rhythm, normal heart sounds and intact distal pulses.  Exam reveals no gallop and no friction rub.   No murmur heard. Pulmonary/Chest: Effort normal and breath sounds normal. No  stridor. No respiratory distress. She has no wheezes. She has no rales. She exhibits no tenderness.  Abdominal: Soft. Bowel sounds are normal. She exhibits no distension. There is no tenderness. There is no rebound and no guarding.  Musculoskeletal: Normal range of motion. She exhibits edema (she has mild, diffuse non-pitting edema). She exhibits no tenderness.  Lymphadenopathy:    She has no cervical adenopathy.  Neurological: She is oriented to person, place, and time.  Skin: Skin is warm and dry. No rash noted. She is not diaphoretic. No erythema. No pallor.  Psychiatric: She has a normal mood and affect. Her behavior is normal. Judgment and thought content normal.      Lab Results  Component Value Date   WBC 8.1 12/22/2011   HGB 12.9 12/22/2011   HCT 38.3 12/22/2011   PLT 201.0 12/22/2011   GLUCOSE 103* 12/22/2011   CHOL 184 12/22/2011   TRIG 104.0 12/22/2011   HDL 55.20 12/22/2011   LDLCALC 108* 12/22/2011   ALT 16 12/22/2011   AST 19 12/22/2011   NA 140 12/22/2011   K 3.8 12/22/2011   CL 106 12/22/2011   CREATININE 0.6 12/22/2011   BUN 15 12/22/2011   CO2 28 12/22/2011   TSH 2.12 12/22/2011      Assessment & Plan:

## 2012-01-11 NOTE — Assessment & Plan Note (Signed)
Will stop benicar and start HCTZ

## 2012-01-12 ENCOUNTER — Ambulatory Visit: Payer: 59 | Admitting: Internal Medicine

## 2012-02-13 ENCOUNTER — Ambulatory Visit: Payer: 59 | Admitting: Internal Medicine

## 2012-02-24 ENCOUNTER — Ambulatory Visit: Payer: 59 | Admitting: Internal Medicine

## 2012-03-10 ENCOUNTER — Ambulatory Visit: Payer: 59 | Admitting: Internal Medicine

## 2012-03-31 ENCOUNTER — Other Ambulatory Visit (HOSPITAL_COMMUNITY): Payer: Self-pay | Admitting: Orthopedic Surgery

## 2012-03-31 DIAGNOSIS — M25569 Pain in unspecified knee: Secondary | ICD-10-CM

## 2012-04-02 ENCOUNTER — Ambulatory Visit (HOSPITAL_COMMUNITY)
Admission: RE | Admit: 2012-04-02 | Discharge: 2012-04-02 | Disposition: A | Discharge status: Home / Self Care | Payer: 59 | Source: Ambulatory Visit | Attending: Orthopedic Surgery | Admitting: Orthopedic Surgery

## 2012-04-02 DIAGNOSIS — M25569 Pain in unspecified knee: Secondary | ICD-10-CM | POA: Insufficient documentation

## 2012-04-02 DIAGNOSIS — Z9181 History of falling: Secondary | ICD-10-CM | POA: Insufficient documentation

## 2012-05-11 ENCOUNTER — Ambulatory Visit (INDEPENDENT_AMBULATORY_CARE_PROVIDER_SITE_OTHER): Payer: 59 | Admitting: Endocrinology

## 2012-05-11 ENCOUNTER — Encounter: Payer: Self-pay | Admitting: Endocrinology

## 2012-05-11 VITALS — BP 120/82 | HR 71 | Temp 98.1°F

## 2012-05-11 DIAGNOSIS — IMO0002 Reserved for concepts with insufficient information to code with codable children: Secondary | ICD-10-CM

## 2012-05-11 MED ORDER — LEVOFLOXACIN 500 MG PO TABS: 500.0000 mg | ORAL_TABLET | Freq: Every day | ORAL | Status: AC

## 2012-05-11 NOTE — Progress Notes (Signed)
  Subjective:    Patient ID: Jenny Parsons, female    DOB: 04-May-1976, 36 y.o.   MRN: [id number]  HPI Pt states few days of moderate pain at the left great toe, and assoc drainage.  It started in the context of a minor injury Past Medical History  Diagnosis Date  . Hypertension     Past Surgical History  Procedure Date  . Tubal ligation     History   Social History  . Marital Status: Married    Spouse Name: N/A    Number of Children: N/A  . Years of Education: N/A   Occupational History  . Not on file.   Social History Main Topics  . Smoking status: Never Smoker   . Smokeless tobacco: Never Used  . Alcohol Use: No  . Drug Use: No  . Sexually Active: Yes    Birth Control/ Protection: Surgical   Other Topics Concern  . Not on file   Social History Narrative  . No narrative on file    Current Outpatient Prescriptions on File Prior to Visit  Medication Sig Dispense Refill  . hydrochlorothiazide (MICROZIDE) 12.5 MG capsule Take 1 capsule (12.5 mg total) by mouth daily.  90 capsule  1   No Known Allergies  Family History  Problem Relation Age of Onset  . Hypertension Mother   . Cancer Neg Hx   . Diabetes Neg Hx   . Stroke Neg Hx    BP 120/82  Pulse 71  Temp 98.1 F (36.7 C) (Oral)  SpO2 98%  Review of Systems Denies fever and numbness    Objective:   Physical Exam VITAL SIGNS:  See vs page GENERAL: no distress Left great toe: at the paronychial area, there is swelling/tenderness/erythema.  No drainage at the time of today's visit.       Assessment & Plan:  Paronychial infection, new

## 2012-05-11 NOTE — Patient Instructions (Addendum)
i have sent a prescription to your pharmacy, for an antibiotic pill. You will get better much faster if you elevate your foot above the rest of your body. I hope you feel better soon.  If you don't feel better by next week, please call back.  Please call sooner if you get worse.

## 2012-05-12 ENCOUNTER — Ambulatory Visit: Payer: 59 | Admitting: Internal Medicine

## 2012-09-01 ENCOUNTER — Ambulatory Visit (INDEPENDENT_AMBULATORY_CARE_PROVIDER_SITE_OTHER): Payer: 59 | Admitting: Internal Medicine

## 2012-09-01 ENCOUNTER — Encounter: Payer: Self-pay | Admitting: Internal Medicine

## 2012-09-01 ENCOUNTER — Ambulatory Visit: Payer: 59 | Admitting: Internal Medicine

## 2012-09-01 VITALS — BP 124/80 | HR 63 | Temp 98.2°F | Resp 16 | Ht 60.0 in | Wt 224.2 lb

## 2012-09-01 DIAGNOSIS — J209 Acute bronchitis, unspecified: Secondary | ICD-10-CM

## 2012-09-01 MED ORDER — HYDROCOD POLST-CPM POLST ER 10-8 MG PO CP12: 1.0000 | ORAL_CAPSULE | Freq: Two times a day (BID) | ORAL | Status: DC | PRN

## 2012-09-01 MED ORDER — AZITHROMYCIN 500 MG PO TABS: 500.0000 mg | ORAL_TABLET | Freq: Every day | ORAL | Status: DC

## 2012-09-01 NOTE — Progress Notes (Signed)
  Subjective:    Patient ID: Jenny Parsons, female    DOB: May 13, 1976, 36 y.o.   MRN: [id number]  URI  This is a new problem. The current episode started in the past 7 days. The problem has been unchanged. There has been no fever. The fever has been present for less than 1 day. Associated symptoms include congestion, coughing (productive of yellow phlegm), rhinorrhea, sinus pain, sneezing and a sore throat. Pertinent negatives include no abdominal pain, chest pain, diarrhea, dysuria, headaches, joint pain, joint swelling, nausea, neck pain, plugged ear sensation, rash, swollen glands, vomiting or wheezing. She has tried nothing for the symptoms.      Review of Systems  Constitutional: Positive for chills and fatigue. Negative for fever, diaphoresis, activity change, appetite change and unexpected weight change.  HENT: Positive for congestion, sore throat, rhinorrhea, sneezing and postnasal drip. Negative for facial swelling, trouble swallowing, neck pain, voice change and ear discharge.   Eyes: Negative.   Respiratory: Positive for cough (productive of yellow phlegm). Negative for wheezing.   Cardiovascular: Negative for chest pain and leg swelling.  Gastrointestinal: Negative for nausea, vomiting, abdominal pain, diarrhea and constipation.  Genitourinary: Negative.  Negative for dysuria.  Musculoskeletal: Negative.  Negative for joint pain.  Skin: Negative.  Negative for rash.  Neurological: Negative.  Negative for headaches.  Hematological: Negative for adenopathy. Does not bruise/bleed easily.  Psychiatric/Behavioral: Negative.        Objective:   Physical Exam  Vitals reviewed. Constitutional: She is oriented to person, place, and time. She appears well-developed and well-nourished.  Non-toxic appearance. She does not have a sickly appearance. She does not appear ill. No distress.  HENT:  Head: Normocephalic and atraumatic. No trismus in the jaw.  Right Ear: Hearing, tympanic  membrane, external ear and ear canal normal.  Left Ear: Hearing, external ear and ear canal normal.  Nose: Mucosal edema and rhinorrhea present. No sinus tenderness.  No foreign bodies. Right sinus exhibits maxillary sinus tenderness. Right sinus exhibits no frontal sinus tenderness. Left sinus exhibits maxillary sinus tenderness. Left sinus exhibits no frontal sinus tenderness.  Mouth/Throat: Oropharynx is clear and moist and mucous membranes are normal. Mucous membranes are not pale, not dry and not cyanotic. No oral lesions. No uvula swelling. No oropharyngeal exudate, posterior oropharyngeal edema, posterior oropharyngeal erythema or tonsillar abscesses.  Eyes: Conjunctivae normal are normal. Right eye exhibits no discharge. Left eye exhibits no discharge. No scleral icterus.  Neck: Normal range of motion. Neck supple. No JVD present. No tracheal deviation present. No thyromegaly present.  Cardiovascular: Normal rate, regular rhythm, normal heart sounds and intact distal pulses.  Exam reveals no gallop and no friction rub.   No murmur heard. Pulmonary/Chest: Effort normal and breath sounds normal. No stridor. No respiratory distress. She has no wheezes. She has no rales. She exhibits no tenderness.  Abdominal: Soft. Bowel sounds are normal. She exhibits no distension and no mass. There is no tenderness. There is no rebound and no guarding.  Musculoskeletal: Normal range of motion. She exhibits no edema and no tenderness.  Lymphadenopathy:    She has no cervical adenopathy.  Neurological: She is oriented to person, place, and time.  Skin: Skin is warm and dry. No rash noted. She is not diaphoretic. No erythema. No pallor.  Psychiatric: She has a normal mood and affect. Her behavior is normal. Judgment and thought content normal.          Assessment & Plan:

## 2012-09-01 NOTE — Patient Instructions (Signed)

## 2012-09-01 NOTE — Assessment & Plan Note (Signed)
Start zpak for the infection and a cough suppressant 

## 2012-11-09 ENCOUNTER — Telehealth: Payer: Self-pay | Admitting: Internal Medicine

## 2012-11-09 NOTE — Telephone Encounter (Signed)
zpac stays in the system x 10 d and keeps working OV if not well Thx

## 2012-11-09 NOTE — Telephone Encounter (Signed)
Patient Information:  Caller Name: Belva  Phone: [phone]  Patient: Jenny Parsons, Jenny Parsons  Gender: Female  DOB: 1976/05/13  Age: 37 Years  PCP: Sanda Linger (Adults only)  Pregnant: No  Office Follow Up:  Does the office need to follow up with this patient?: No  Instructions For The Office: N/A   Symptoms  Reason For Call & Symptoms: Sore throat, stuffy head, mild cough.,no fever.  Sore throat rated at 8 of 10.  Yellow/ green sputum reported.  Reviewed Health History In EMR: Yes  Reviewed Medications In EMR: Yes  Reviewed Allergies In EMR: Yes  Reviewed Surgeries / Procedures: Yes  Date of Onset of Symptoms: 11/02/2012  Treatments Tried: Mucinex x 4 days; drainage down throat continues.  Treatments Tried Worked: Yes OB / GYN:  LMP: Unknown  Guideline(s) Used:  Sore Throat  Disposition Per Guideline:   See Today in Office  Reason For Disposition Reached:   Severe sore throat pain  Advice Given:  For Relief of Sore Throat Pain:  Sip warm chicken broth or apple juice.  Suck on hard candy or a throat lozenge (over-the-counter).  Gargle warm salt water 3 times daily (1 teaspoon of salt in 8 oz or 240 ml of warm water).  Avoid cigarette smoke.  Pain Medicines:  For pain relief, you can take either acetaminophen, ibuprofen, or naproxen.  They are over-the-counter (OTC) pain drugs. You can buy them at the drugstore.  Acetaminophen (e.g., Tylenol):  Regular Strength Tylenol: Take 650 mg (two 325 mg pills) by mouth every 4-6 hours as needed. Each Regular Strength Tylenol pill has 325 mg of acetaminophen.  Extra Strength Tylenol: Take 1,000 mg (two 500 mg pills) every 8 hours as needed. Each Extra Strength Tylenol pill has 500 mg of acetaminophen.  The most you should take each day is 3,000 mg (10 Regular Strength or 6 Extra Strength pills a day).  Soft Diet:   Cold drinks and milk shakes are especially good (Reason: swollen tonsils can make some foods hard to  swallow).  Liquids:  Adequate liquid intake is important to prevent dehydration. Drink 6-8 glasses of water per day.  Contagiousness:   You can return to work or school after the fever is gone and you feel well enough to participate in normal activities. If your doctor determines that you have Strep throat, then you will need to take an antibiotic for 24 hours before you can return.  Call Back If:  You become worse.  Patient Refused Recommendation:  Patient Will Follow Up With Office Later  States she started new job and cannot leave; plans to follow up with office on 2/19.  She aalso requested Rx.

## 2012-11-09 NOTE — Telephone Encounter (Signed)
Please advise in Dr Jones absence 

## 2013-01-12 ENCOUNTER — Other Ambulatory Visit: Payer: Self-pay

## 2013-01-12 DIAGNOSIS — I10 Essential (primary) hypertension: Secondary | ICD-10-CM

## 2013-01-12 MED ORDER — HYDROCHLOROTHIAZIDE 12.5 MG PO CAPS: 12.5000 mg | ORAL_CAPSULE | Freq: Every day | ORAL | Status: DC

## 2013-03-07 LAB — HM PAP SMEAR

## 2013-03-10 ENCOUNTER — Ambulatory Visit (INDEPENDENT_AMBULATORY_CARE_PROVIDER_SITE_OTHER): Payer: 59 | Admitting: Internal Medicine

## 2013-03-10 ENCOUNTER — Encounter: Payer: Self-pay | Admitting: Internal Medicine

## 2013-03-10 VITALS — BP 132/92 | HR 58 | Temp 97.0°F | Ht 60.0 in | Wt 277.0 lb

## 2013-03-10 DIAGNOSIS — J069 Acute upper respiratory infection, unspecified: Secondary | ICD-10-CM

## 2013-03-10 MED ORDER — AZITHROMYCIN 250 MG PO TABS: ORAL_TABLET | ORAL | Status: DC

## 2013-03-10 NOTE — Progress Notes (Signed)
HPI  Pt presents to the clinic today with c/o cold symptoms x 2 weeks. The worst part is the sore throat and cough. She has been producing green sputum. She has taken Mucinex, Sudafed and Nyquil but her symptoms seems to be getting worse. She has not had sick contacts that she is aware of. She has no history of allergies or asthma. She is not currently smoking.  Review of Systems      Past Medical History  Diagnosis Date  . Hypertension     Family History  Problem Relation Age of Onset  . Hypertension Mother   . Cancer Neg Hx   . Diabetes Neg Hx   . Stroke Neg Hx     History   Social History  . Marital Status: Married    Spouse Name: N/A    Number of Children: N/A  . Years of Education: N/A   Occupational History  . Not on file.   Social History Main Topics  . Smoking status: Never Smoker   . Smokeless tobacco: Never Used  . Alcohol Use: No  . Drug Use: No  . Sexually Active: Yes    Birth Control/ Protection: Surgical   Other Topics Concern  . Not on file   Social History Narrative  . No narrative on file    No Known Allergies   Constitutional: Positive headache, fatigue. Denies fever, abrupt weight changes.  HEENT:  Positive sore throat. Denies eye redness, eye pain, pressure behind the eyes, facial pain, nasal congestion, ear pain, ringing in the ears, wax buildup, runny nose or bloody nose. Respiratory: Positive cough. Denies difficulty breathing or shortness of breath.  Cardiovascular: Denies chest pain, chest tightness, palpitations or swelling in the hands or feet.   No other specific complaints in a complete review of systems (except as listed in HPI above).  Objective:   BP 132/92  Pulse 58  Temp(Src) 97 F (36.1 C) (Oral)  Ht 5' (1.524 m)  Wt 277 lb (125.646 kg)  BMI 54.1 kg/m2  SpO2 97% Wt Readings from Last 3 Encounters:  03/10/13 277 lb (125.646 kg)  09/01/12 224 lb 4 oz (101.719 kg)  01/08/12 219 lb (99.338 kg)     General:  Appears her stated age, well developed, well nourished in NAD. HEENT: Head: normal shape and size; Eyes: sclera white, no icterus, conjunctiva pink, PERRLA and EOMs intact; Ears: Tm's gray and intact, normal light reflex; Nose: mucosa pink and moist, septum midline; Throat/Mouth: + PND. Teeth present, mucosa erythematous and moist, no exudate noted, no lesions or ulcerations noted.  Neck: Mild cervical lymphadenopathy. Neck supple, trachea midline. No massses, lumps or thyromegaly present.  Cardiovascular: Normal rate and rhythm. S1,S2 noted.  No murmur, rubs or gallops noted. No JVD or BLE edema. No carotid bruits noted. Pulmonary/Chest: Normal effort and positive vesicular breath sounds. No respiratory distress. No wheezes, rales or ronchi noted.      Assessment & Plan:   Upper Respiratory Infection, new onset, > 2 weeks:  Get some rest and drink plenty of water Do salt water gargles for the sore throat eRx for Azithromax x 5 days Continue Mucinex and Nyquil as needed  RTC as needed or if symptoms persist.

## 2013-03-10 NOTE — Patient Instructions (Signed)

## 2013-03-14 ENCOUNTER — Telehealth: Payer: Self-pay | Admitting: Internal Medicine

## 2013-03-14 NOTE — Telephone Encounter (Signed)
Pt informed of NP's advisement regarding antihistamine via VM and to callback office with any questions/concerns.

## 2013-03-14 NOTE — Telephone Encounter (Signed)
Pt also called and left VM on Friday requesting something for to dry u/clear up mucus and congestion-please advise.

## 2013-03-14 NOTE — Telephone Encounter (Signed)
Pt has been taking Mucinex OTC since before OV and continues to take and it's not working. Please advise alternative medication.

## 2013-03-14 NOTE — Telephone Encounter (Signed)
Pt is still on the antibiotic.  Last dose is today.  Still has sinus drainage.  Is there something to dry it up?

## 2013-03-14 NOTE — Telephone Encounter (Signed)
Try Mucinex OTC. 

## 2013-03-14 NOTE — Telephone Encounter (Signed)
Only other to take is OTC antihistamine like Zyrtec if not currently taking it already

## 2013-03-14 NOTE — Telephone Encounter (Signed)
Please send this to Jenny Parsons 

## 2013-08-31 ENCOUNTER — Encounter: Payer: Self-pay | Admitting: Internal Medicine

## 2013-08-31 ENCOUNTER — Ambulatory Visit (INDEPENDENT_AMBULATORY_CARE_PROVIDER_SITE_OTHER): Payer: 59 | Admitting: Internal Medicine

## 2013-08-31 VITALS — BP 110/82 | HR 65 | Temp 98.7°F | Wt 211.8 lb

## 2013-08-31 DIAGNOSIS — J019 Acute sinusitis, unspecified: Secondary | ICD-10-CM

## 2013-08-31 MED ORDER — LEVOFLOXACIN 500 MG PO TABS: 500.0000 mg | ORAL_TABLET | Freq: Every day | ORAL | Status: DC

## 2013-08-31 NOTE — Patient Instructions (Addendum)
It was good to see you today.  Levaquin antibiotics daily for next 7 days - Your prescription(s) have been submitted to your pharmacy. Please take as directed and contact our office if you believe you are having problem(s) with the medication(s).  Continue using Sudafed as needed for decongestion, and NyQuil as needed for nighttime symptoms and Mucinex as needed  Please call if symptoms unimproved in next 2 weeks, sooner if worse  Sinusitis Sinusitis is redness, soreness, and puffiness (inflammation) of the air pockets in the bones of your face (sinuses). The redness, soreness, and puffiness can cause air and mucus to get trapped in your sinuses. This can allow germs to grow and cause an infection.  HOME CARE   Drink enough fluids to keep your pee (urine) clear or pale yellow.  Use a humidifier in your home.  Run a hot shower to create steam in the bathroom. Sit in the bathroom with the door closed. Breathe in the steam 3 4 times a day.  Put a warm, moist washcloth on your face 3 4 times a day, or as told by your doctor.  Use salt water sprays (saline sprays) to wet the thick fluid in your nose. This can help the sinuses drain.  Only take medicine as told by your doctor. GET HELP RIGHT AWAY IF:   Your pain gets worse.  You have very bad headaches.  You are sick to your stomach (nauseous).  You throw up (vomit).  You are very sleepy (drowsy) all the time.  Your face is puffy (swollen).  Your vision changes.  You have a stiff neck.  You have trouble breathing. MAKE SURE YOU:   Understand these instructions.  Will watch your condition.  Will get help right away if you are not doing well or get worse. Document Released: 02/25/2008 Document Revised: 06/02/2012 Document Reviewed: 04/13/2012 [facility] Patient Information 2014 [place], [place].

## 2013-08-31 NOTE — Progress Notes (Signed)
Pre-visit discussion using our clinic review tool. No additional management support is needed unless otherwise documented below in the visit note.  

## 2013-08-31 NOTE — Progress Notes (Signed)
   Subjective:    Patient ID: Jenny Parsons, female    DOB: 1976-03-06, 37 y.o.   MRN: [id number]  Cough This is a recurrent problem. The current episode started 1 to 4 weeks ago. The problem has been waxing and waning. The problem occurs hourly. The cough is non-productive. Associated symptoms include chills, ear congestion, headaches, nasal congestion, postnasal drip and a sore throat. Pertinent negatives include no chest pain, ear pain, fever, heartburn, hemoptysis, myalgias, rash, shortness of breath, weight loss or wheezing. The symptoms are aggravated by lying down. Risk factors: none - no travel, smoking or pulmonary disease. She has tried OTC cough suppressant (NyQuil, Sudafed, Mucinex) for the symptoms. The treatment provided mild relief.    Past Medical History  Diagnosis Date  . Hypertension     Review of Systems  Constitutional: Positive for chills. Negative for fever and weight loss.  HENT: Positive for postnasal drip and sore throat. Negative for ear pain.   Respiratory: Positive for cough. Negative for hemoptysis, shortness of breath and wheezing.   Cardiovascular: Negative for chest pain.  Gastrointestinal: Negative for heartburn.  Musculoskeletal: Negative for myalgias.  Skin: Negative for rash.  Neurological: Positive for headaches.       Objective:   Physical Exam BP 110/82  Pulse 65  Temp(Src) 98.7 F (37.1 C) (Oral)  Wt 211 lb 12.8 oz (96.072 kg)  SpO2 98% Wt Readings from Last 3 Encounters:  08/31/13 211 lb 12.8 oz (96.072 kg)  03/10/13 277 lb (125.646 kg)  09/01/12 224 lb 4 oz (101.719 kg)   Constitutional: She appears well-developed and well-nourished. No distress. tired but non toxic HENT: Head: Normocephalic and atraumatic. Mild sinus tenderness frontal greater than maxillary bilateral. Ears: B TMs hazy but no erythema or effusion; Nose: right greater than left turbinates swelling, thick discharge evident. Mouth/Throat: Oropharynx is clear and  moist. No oropharyngeal exudate.  Eyes: Conjunctivae and EOM are normal. Pupils are equal, round, and reactive to light. No scleral icterus.  Neck: Normal range of motion. Neck supple. No JVD present. No thyromegaly present.  Cardiovascular: Normal rate, regular rhythm and normal heart sounds.  No murmur heard. No BLE edema. Pulmonary/Chest: Effort normal and breath sounds normal. No respiratory distress. She has no wheezes.   Psychiatric: She has a normal mood and affect. Her behavior is normal. Judgment and thought content normal.   Lab Results  Component Value Date   WBC 8.1 12/22/2011   HGB 12.9 12/22/2011   HCT 38.3 12/22/2011   PLT 201.0 12/22/2011   GLUCOSE 103* 12/22/2011   CHOL 184 12/22/2011   TRIG 104.0 12/22/2011   HDL 55.20 12/22/2011   LDLCALC 108* 12/22/2011   ALT 16 12/22/2011   AST 19 12/22/2011   NA 140 12/22/2011   K 3.8 12/22/2011   CL 106 12/22/2011   CREATININE 0.6 12/22/2011   BUN 15 12/22/2011   CO2 28 12/22/2011   TSH 2.12 12/22/2011        Assessment & Plan:   Acute sinusitis  Treat with Levaquin Continue over-the-counter symptomatic care with decongestants, antihistamines and cough suppressant  Consider nasal steroid if symptoms slow to improve  Patient agrees to call if symptoms unimproved in next 2 weeks, sooner if worse

## 2013-09-07 ENCOUNTER — Other Ambulatory Visit: Payer: Self-pay | Admitting: Obstetrics and Gynecology

## 2013-09-07 DIAGNOSIS — N644 Mastodynia: Secondary | ICD-10-CM

## 2013-09-14 ENCOUNTER — Ambulatory Visit
Admission: RE | Admit: 2013-09-14 | Discharge: 2013-09-14 | Disposition: A | Discharge status: Home / Self Care | Payer: 59 | Source: Ambulatory Visit | Attending: Obstetrics and Gynecology | Admitting: Obstetrics and Gynecology

## 2013-09-14 DIAGNOSIS — N644 Mastodynia: Secondary | ICD-10-CM

## 2013-11-30 ENCOUNTER — Encounter: Payer: Self-pay | Admitting: Internal Medicine

## 2013-11-30 ENCOUNTER — Ambulatory Visit (INDEPENDENT_AMBULATORY_CARE_PROVIDER_SITE_OTHER): Payer: 59 | Admitting: Internal Medicine

## 2013-11-30 VITALS — BP 128/90 | HR 74 | Temp 97.8°F | Resp 14 | Wt 215.0 lb

## 2013-11-30 DIAGNOSIS — R609 Edema, unspecified: Secondary | ICD-10-CM | POA: Insufficient documentation

## 2013-11-30 DIAGNOSIS — I10 Essential (primary) hypertension: Secondary | ICD-10-CM

## 2013-11-30 MED ORDER — RAMIPRIL 2.5 MG PO CAPS: 2.5000 mg | ORAL_CAPSULE | Freq: Every day | ORAL | Status: DC

## 2013-11-30 NOTE — Progress Notes (Signed)
Pre visit review using our clinic review tool, if applicable. No additional management support is needed unless otherwise documented below in the visit note. 

## 2013-11-30 NOTE — Patient Instructions (Signed)

## 2013-11-30 NOTE — Progress Notes (Signed)
   Subjective:    Patient ID: Jenny Parsons, female    DOB: 07/23/1976, 38 y.o.   MRN: [id number]  HPI   She was concerned about her blood pressure as she has had some headaches upon awakening X 1 week. These respond to Tylenol or NSAIDS.She's also noted swelling of her hands and feet.  She does not monitor her blood pressure at home. She has been compliant with the diuretic.  She denies excess salt intake , stress or other triggers.. Exercise encompasses 20 minutes 4  times per week as  Walking or bike without symptoms.  Family history is strongly  for HTN /CVA    Review of Systems  Significant epistaxis, chest pain, palpitations, exertional dyspnea, claudication, paroxysmal nocturnal dyspnea, or edema absent.     Objective:   Physical Exam Appears healthy and well-nourished; weight excess; in no acute distress  No carotid bruits are present.No neck pain distention present at 10 - 15 degrees. Thyroid normal to palpation  Heart rhythm and rate are normal with no gallop or murmur. S4  Chest is clear with no increased work of breathing  There is no evidence of aortic aneurysm or renal artery bruits  Abdomen soft with no organomegaly or masses. No HJR  No clubbing, cyanosis . Trace edema present.  Pedal pulses are intact   No ischemic skin changes are present . Fingernails healthy   Alert and oriented. Strength, tone, DTRs reflexes normal          Assessment & Plan:  #1 edema  #2 HTN See orders

## 2013-12-01 ENCOUNTER — Other Ambulatory Visit (INDEPENDENT_AMBULATORY_CARE_PROVIDER_SITE_OTHER): Payer: 59

## 2013-12-01 ENCOUNTER — Ambulatory Visit: Payer: 59 | Admitting: Internal Medicine

## 2013-12-01 DIAGNOSIS — R609 Edema, unspecified: Secondary | ICD-10-CM

## 2013-12-01 LAB — BASIC METABOLIC PANEL
BUN: 16 mg/dL (ref 6–23)
CALCIUM: 9.3 mg/dL (ref 8.4–10.5)
CO2: 31 mEq/L (ref 19–32)
Chloride: 103 mEq/L (ref 96–112)
Creatinine, Ser: 0.9 mg/dL (ref 0.4–1.2)
GFR: 74.52 mL/min (ref >60.00)
Glucose, Bld: 84 mg/dL (ref 70–99)
Potassium: 3.5 mEq/L (ref 3.5–5.1)
SODIUM: 140 meq/L (ref 135–145)

## 2014-01-04 ENCOUNTER — Ambulatory Visit (INDEPENDENT_AMBULATORY_CARE_PROVIDER_SITE_OTHER): Payer: 59 | Admitting: Internal Medicine

## 2014-01-04 ENCOUNTER — Telehealth: Payer: Self-pay | Admitting: Internal Medicine

## 2014-01-04 ENCOUNTER — Encounter: Payer: Self-pay | Admitting: Internal Medicine

## 2014-01-04 VITALS — HR 71 | Temp 98.4°F

## 2014-01-04 DIAGNOSIS — I10 Essential (primary) hypertension: Secondary | ICD-10-CM

## 2014-01-04 DIAGNOSIS — H6091 Unspecified otitis externa, right ear: Secondary | ICD-10-CM

## 2014-01-04 DIAGNOSIS — H60399 Other infective otitis externa, unspecified ear: Secondary | ICD-10-CM

## 2014-01-04 MED ORDER — SULFAMETHOXAZOLE-TRIMETHOPRIM 800-160 MG PO TABS: 1.0000 | ORAL_TABLET | Freq: Two times a day (BID) | ORAL | Status: DC

## 2014-01-04 MED ORDER — ANTIPYRINE-BENZOCAINE 5.4-1.4 % OT SOLN: 3.0000 [drp] | OTIC | Status: DC | PRN

## 2014-01-04 NOTE — Progress Notes (Signed)
Subjective:    Patient ID: Jenny Parsons, female    DOB: 15-Mar-1976, 38 y.o.   MRN: [id number]  Otalgia  Pertinent negatives include no coughing, ear discharge or hearing loss.    Patient is here for r ear pain Ongoing > 1 week No prior eval for same issue associated with pain on palpation/direct contact Taking OTC antihistamine for AR symptoms but not changing ear pain Not associated with swelling   Also reviewed chronic medical issues and interval medical events  Past Medical History  Diagnosis Date  . Hypertension     Review of Systems  Constitutional: Negative for fever and fatigue.  HENT: Positive for ear pain. Negative for ear discharge, facial swelling, hearing loss and tinnitus.   Respiratory: Negative for cough and shortness of breath.        Objective:   Physical Exam  Pulse 71  Temp(Src) 98.4 F (36.9 C) (Oral)  SpO2 98% Wt Readings from Last 3 Encounters:  11/30/13 215 lb (97.523 kg)  08/31/13 211 lb 12.8 oz (96.072 kg)  03/10/13 277 lb (125.646 kg)   Constitutional: She is overweight, but appears well-developed and well-nourished. No distress.  HENT: R ear/pinna appears slightly edematous, erythematous "pimple" on inner trag - mild canal erythema, TM clear  - tender to palpation over tragus - L TM/canal normal Neck: Normal range of motion. Neck supple. No JVD present. No thyromegaly present.  Cardiovascular: Normal rate, regular rhythm and normal heart sounds.  No murmur heard. No BLE edema. Pulmonary/Chest: Effort normal and breath sounds normal. No respiratory distress. She has no wheezes.  Psychiatric: She has a normal mood and affect. Her behavior is normal. Judgment and thought content normal.   Lab Results  Component Value Date   WBC 8.1 12/22/2011   HGB 12.9 12/22/2011   HCT 38.3 12/22/2011   PLT 201.0 12/22/2011   GLUCOSE 84 12/01/2013   CHOL 184 12/22/2011   TRIG 104.0 12/22/2011   HDL 55.20 12/22/2011   LDLCALC 108* 12/22/2011   ALT 16  12/22/2011   AST 19 12/22/2011   NA 140 12/01/2013   K 3.5 12/01/2013   CL 103 12/01/2013   CREATININE 0.9 12/01/2013   BUN 16 12/01/2013   CO2 31 12/01/2013   TSH 2.12 12/22/2011    Mm Digital Diagnostic Bilat  09/14/2013   CLINICAL DATA:  Diffuse left breast pain  EXAM: DIGITAL DIAGNOSTIC  BILATERAL MAMMOGRAM WITH CAD  COMPARISON:  None.  ACR Breast Density Category b: There are scattered areas of fibroglandular density.  FINDINGS: Cc and MLO views of bilateral breasts are submitted. No suspicious abnormalities identified bilaterally.  Mammographic images were processed with CAD.  IMPRESSION: Negative.  RECOMMENDATION: Routine screening mammogram at age 38.  I have discussed the findings and recommendations with the patient. Results were also provided in writing at the conclusion of the visit. If applicable, a reminder letter will be sent to the patient regarding the next appointment.  BI-RADS CATEGORY  1: Negative   Electronically Signed   By: Sherian ReinWei-Chen  Lin M.D.   On: 09/14/2013 08:10       Assessment & Plan:   R ear pain - ongoing >7 days "pimple" on exam, early OE  tx with septra bid x 1 week and topical analgesia - ex done  Problem List Items Addressed This Visit   Essential hypertension, benign      BP Readings from Last 3 Encounters:  11/30/13 128/90  08/31/13 110/82  03/10/13 132/92  Added ACEI to HCTZ 11/2013 - much improved The current medical regimen is effective;  continue present plan and medications.      Other Visit Diagnoses   Otitis externa of right ear    -  Primary    Relevant Medications       antipyrine-benzocaine (AURALGAN) otic solution

## 2014-01-04 NOTE — Telephone Encounter (Signed)
Relevant patient education assigned to patient using Emmi. ° °

## 2014-01-04 NOTE — Progress Notes (Signed)
Pre visit review using our clinic review tool, if applicable. No additional management support is needed unless otherwise documented below in the visit note. 

## 2014-01-04 NOTE — Patient Instructions (Signed)
It was good to see you today.  We have reviewed your prior records including labs and tests today  Begin antibiotics Septra twice daily for one week and topical ear drops for pain relief as needed Your prescription(s) have been submitted to your pharmacy. Please take as directed and contact our office if you believe you are having problem(s) with the medication(s).  Other Medications reviewed and updated, no new changes recommended at this time.  Please schedule followup in 3-4 months (for annual and labs with Dr Yetta BarreJones), call sooner if problems.

## 2014-01-04 NOTE — Assessment & Plan Note (Signed)
BP Readings from Last 3 Encounters:  11/30/13 128/90  08/31/13 110/82  03/10/13 132/92   Added ACEI to HCTZ 11/2013 - much improved The current medical regimen is effective;  continue present plan and medications.

## 2014-01-09 ENCOUNTER — Encounter: Payer: Self-pay | Admitting: Internal Medicine

## 2014-01-09 DIAGNOSIS — H9201 Otalgia, right ear: Secondary | ICD-10-CM

## 2014-04-06 ENCOUNTER — Encounter: Payer: 59 | Admitting: Internal Medicine

## 2014-07-07 ENCOUNTER — Other Ambulatory Visit: Payer: Self-pay

## 2014-07-17 ENCOUNTER — Encounter: Payer: Self-pay | Admitting: Internal Medicine

## 2014-07-17 ENCOUNTER — Ambulatory Visit (INDEPENDENT_AMBULATORY_CARE_PROVIDER_SITE_OTHER): Payer: 59 | Admitting: Internal Medicine

## 2014-07-17 VITALS — BP 158/102 | HR 74 | Temp 98.2°F | Resp 16 | Ht 60.0 in | Wt 216.0 lb

## 2014-07-17 DIAGNOSIS — R609 Edema, unspecified: Secondary | ICD-10-CM

## 2014-07-17 DIAGNOSIS — I10 Essential (primary) hypertension: Secondary | ICD-10-CM

## 2014-07-17 DIAGNOSIS — Z0000 Encounter for general adult medical examination without abnormal findings: Secondary | ICD-10-CM

## 2014-07-17 MED ORDER — RAMIPRIL 2.5 MG PO CAPS: 2.5000 mg | ORAL_CAPSULE | Freq: Every day | ORAL | Status: DC

## 2014-07-17 MED ORDER — HYDROCHLOROTHIAZIDE 12.5 MG PO CAPS: 12.5000 mg | ORAL_CAPSULE | Freq: Every day | ORAL | Status: DC

## 2014-07-17 NOTE — Progress Notes (Signed)
Pre visit review using our clinic review tool, if applicable. No additional management support is needed unless otherwise documented below in the visit note. 

## 2014-07-17 NOTE — Assessment & Plan Note (Signed)
Her BP is not well controlled Will restart the ACEI and HCTZ Will check her labs today to look for end organ damage and secondary causes of HNT She will work on her lifestyle complications

## 2014-07-17 NOTE — Progress Notes (Signed)
Subjective:    Patient ID: Jenny HertzKimberly W Patterson, female    DOB: Jan 08, 1976, 38 y.o.   MRN: [id number]  Hypertension This is a chronic problem. The current episode started more than 1 year ago. The problem has been gradually worsening since onset. The problem is uncontrolled. Associated symptoms include headaches. Pertinent negatives include no anxiety, blurred vision, chest pain, malaise/fatigue, neck pain, orthopnea, palpitations, peripheral edema, PND, shortness of breath or sweats. There are no associated agents to hypertension. Risk factors for coronary artery disease include obesity. Past treatments include ACE inhibitors and diuretics. The current treatment provides no improvement. Compliance problems include psychosocial issues, exercise and diet (she ran out of meds 2 weeks ago).       Review of Systems  Constitutional: Negative.  Negative for fever, chills, malaise/fatigue, diaphoresis, appetite change and fatigue.  Eyes: Negative.  Negative for blurred vision.  Respiratory: Negative.  Negative for apnea, cough, choking, chest tightness, shortness of breath, wheezing and stridor.   Cardiovascular: Negative.  Negative for chest pain, palpitations, orthopnea, leg swelling and PND.  Gastrointestinal: Negative.  Negative for nausea, vomiting, abdominal pain, diarrhea, constipation and blood in stool.  Endocrine: Negative.   Genitourinary: Negative.  Negative for urgency, frequency, hematuria, flank pain, decreased urine volume and difficulty urinating.  Musculoskeletal: Negative.  Negative for neck pain.  Skin: Negative.   Allergic/Immunologic: Negative.   Neurological: Positive for headaches. Negative for dizziness, tremors, seizures, syncope, facial asymmetry, speech difficulty, weakness, light-headedness and numbness.  Hematological: Negative.  Negative for adenopathy. Does not bruise/bleed easily.  Psychiatric/Behavioral: Negative.        Objective:   Physical Exam  Vitals  reviewed. Constitutional: She is oriented to person, place, and time. She appears well-developed and well-nourished. No distress.  HENT:  Head: Normocephalic and atraumatic.  Mouth/Throat: Oropharynx is clear and moist. No oropharyngeal exudate.  Eyes: Conjunctivae are normal. Right eye exhibits no discharge. Left eye exhibits no discharge. No scleral icterus.  Neck: Normal range of motion. Neck supple. No JVD present. No tracheal deviation present. No thyromegaly present.  Cardiovascular: Normal rate, regular rhythm, normal heart sounds and intact distal pulses.  Exam reveals no gallop and no friction rub.   No murmur heard. Pulmonary/Chest: Effort normal and breath sounds normal. No stridor. No respiratory distress. She has no wheezes. She has no rales. She exhibits no tenderness.  Abdominal: Soft. Bowel sounds are normal. She exhibits no distension and no mass. There is no tenderness. There is no rebound and no guarding.  Musculoskeletal: Normal range of motion. She exhibits no edema and no tenderness.  Lymphadenopathy:    She has no cervical adenopathy.  Neurological: She is oriented to person, place, and time.  Skin: Skin is warm and dry. No rash noted. She is not diaphoretic. No erythema. No pallor.  Psychiatric: She has a normal mood and affect. Her behavior is normal. Judgment and thought content normal.     Lab Results  Component Value Date   WBC 8.1 12/22/2011   HGB 12.9 12/22/2011   HCT 38.3 12/22/2011   PLT 201.0 12/22/2011   GLUCOSE 84 12/01/2013   CHOL 184 12/22/2011   TRIG 104.0 12/22/2011   HDL 55.20 12/22/2011   LDLCALC 108* 12/22/2011   ALT 16 12/22/2011   AST 19 12/22/2011   NA 140 12/01/2013   K 3.5 12/01/2013   CL 103 12/01/2013   CREATININE 0.9 12/01/2013   BUN 16 12/01/2013   CO2 31 12/01/2013   TSH 2.12 12/22/2011  Assessment & Plan:

## 2014-07-17 NOTE — Patient Instructions (Signed)

## 2014-08-25 ENCOUNTER — Encounter: Payer: Self-pay | Admitting: Family

## 2014-08-25 ENCOUNTER — Ambulatory Visit (INDEPENDENT_AMBULATORY_CARE_PROVIDER_SITE_OTHER): Payer: 59 | Admitting: Family

## 2014-08-25 VITALS — BP 152/88 | HR 63 | Temp 98.1°F | Resp 18 | Ht 60.0 in | Wt 216.6 lb

## 2014-08-25 DIAGNOSIS — J40 Bronchitis, not specified as acute or chronic: Secondary | ICD-10-CM | POA: Insufficient documentation

## 2014-08-25 MED ORDER — BENZONATATE 100 MG PO CAPS: 100.0000 mg | ORAL_CAPSULE | Freq: Three times a day (TID) | ORAL | Status: DC | PRN

## 2014-08-25 MED ORDER — AZITHROMYCIN 250 MG PO TABS: ORAL_TABLET | ORAL | Status: DC

## 2014-08-25 NOTE — Patient Instructions (Signed)
Thank you for choosing [facility].  Summary/Instructions:  Your prescription(s) have been submitted to your pharmacy. Please take as directed and contact our office if you believe you are having problem(s) with the medication(s).  If your symptoms worsen or fail to improve, please contact our office for further instruction, or in case of emergency go directly to the emergency room at the closest medical facility.    Acute Bronchitis Bronchitis is inflammation of the airways that extend from the windpipe into the lungs (bronchi). The inflammation often causes mucus to develop. This leads to a cough, which is the most common symptom of bronchitis.  In acute bronchitis, the condition usually develops suddenly and goes away over time, usually in a couple weeks. Smoking, allergies, and asthma can make bronchitis worse. Repeated episodes of bronchitis may cause further lung problems.  CAUSES Acute bronchitis is most often caused by the same virus that causes a cold. The virus can spread from person to person (contagious) through coughing, sneezing, and touching contaminated objects. SIGNS AND SYMPTOMS   Cough.   Fever.   Coughing up mucus.   Body aches.   Chest congestion.   Chills.   Shortness of breath.   Sore throat.  DIAGNOSIS  Acute bronchitis is usually diagnosed through a physical exam. Your health care provider will also ask you questions about your medical history. Tests, such as chest X-rays, are sometimes done to rule out other conditions.  TREATMENT  Acute bronchitis usually goes away in a couple weeks. Oftentimes, no medical treatment is necessary. Medicines are sometimes given for relief of fever or cough. Antibiotic medicines are usually not needed but may be prescribed in certain situations. In some cases, an inhaler may be recommended to help reduce shortness of breath and control the cough. A cool mist vaporizer may also be used to help thin bronchial  secretions and make it easier to clear the chest.  HOME CARE INSTRUCTIONS  Get plenty of rest.   Drink enough fluids to keep your urine clear or pale yellow (unless you have a medical condition that requires fluid restriction). Increasing fluids may help thin your respiratory secretions (sputum) and reduce chest congestion, and it will prevent dehydration.   Take medicines only as directed by your health care provider.  If you were prescribed an antibiotic medicine, finish it all even if you start to feel better.  Avoid smoking and secondhand smoke. Exposure to cigarette smoke or irritating chemicals will make bronchitis worse. If you are a smoker, consider using nicotine gum or skin patches to help control withdrawal symptoms. Quitting smoking will help your lungs heal faster.   Reduce the chances of another bout of acute bronchitis by washing your hands frequently, avoiding people with cold symptoms, and trying not to touch your hands to your mouth, nose, or eyes.   Keep all follow-up visits as directed by your health care provider.  SEEK MEDICAL CARE IF: Your symptoms do not improve after 1 week of treatment.  SEEK IMMEDIATE MEDICAL CARE IF:  You develop an increased fever or chills.   You have chest pain.   You have severe shortness of breath.  You have bloody sputum.   You develop dehydration.  You faint or repeatedly feel like you are going to pass out.  You develop repeated vomiting.  You develop a severe headache. MAKE SURE YOU:   Understand these instructions.  Will watch your condition.  Will get help right away if you are not doing well or   get worse. Document Released: 10/16/2004 Document Revised: 01/23/2014 Document Reviewed: 03/01/2013 [employer] Patient Information 2015 [employer]. This information is not intended to replace advice given to you by your health care provider. Make sure you discuss any questions you have with your health care  provider.  

## 2014-08-25 NOTE — Progress Notes (Signed)
Pre visit review using our clinic review tool, if applicable. No additional management support is needed unless otherwise documented below in the visit note. 

## 2014-08-25 NOTE — Assessment & Plan Note (Signed)
Symptoms and exam consistent with acute bronchitis. Given worsening symptoms start azithromycin. Start Tessalon as needed for cough. Continue over-the-counter medications as needed for symptom relief. Follow up if symptoms worsen or fail to improve.

## 2014-08-25 NOTE — Progress Notes (Signed)
   Subjective:    Patient ID: Jenny Parsons, female    DOB: [birth year], 38 y.o.   MRN: [id number]  Chief Complaint  Patient presents with  . Cough    x2 weeks, productive with headache    HPI:  Jenny Parsons is a 38 y.o. female who presents today for an acute visit.   Acute symptoms of cough started about 2 weeks and within the last couple of days has developed a headache. Has had sporadic coughing fits. Every once in a while she has the chills - unaware if she has a fever. Denies any congestion, shortness of breath, body aches or sinus pressure. The first week she felt like a cough and now feels worse. Has tried Mucinex-DM and Nyquil with some relief.   Daughter was most recently was sick with a cold.  No Known Allergies  Current Outpatient Prescriptions on File Prior to Visit  Medication Sig Dispense Refill  . hydrochlorothiazide (MICROZIDE) 12.5 MG capsule Take 1 capsule (12.5 mg total) by mouth daily. 90 capsule 1  . ramipril (ALTACE) 2.5 MG capsule Take 1 capsule (2.5 mg total) by mouth daily. 90 capsule 2   No current facility-administered medications on file prior to visit.    Review of Systems    See HPI Objective:    BP 152/88 mmHg  Pulse 63  Temp(Src) 98.1 F (36.7 C) (Oral)  Resp 18  Ht 5' (1.524 m)  Wt 216 lb 9.6 oz (98.249 kg)  BMI 42.30 kg/m2  SpO2 99% Nursing note and vital signs reviewed.  Physical Exam  Constitutional: She is oriented to person, place, and time. She appears well-developed and well-nourished. No distress.  HENT:  Right Ear: Hearing, tympanic membrane, external ear and ear canal normal.  Left Ear: Hearing, tympanic membrane, external ear and ear canal normal.  Nose: Right sinus exhibits no maxillary sinus tenderness and no frontal sinus tenderness. Left sinus exhibits no maxillary sinus tenderness and no frontal sinus tenderness.  Mouth/Throat: Uvula is midline, oropharynx is clear and moist and mucous membranes are  normal.  Cardiovascular: Normal rate, regular rhythm, normal heart sounds and intact distal pulses.   Pulmonary/Chest: Effort normal and breath sounds normal.  Neurological: She is alert and oriented to person, place, and time.  Skin: Skin is warm and dry.  Psychiatric: She has a normal mood and affect. Her behavior is normal. Judgment and thought content normal.       Assessment & Plan:

## 2014-08-31 ENCOUNTER — Telehealth: Payer: Self-pay | Admitting: Internal Medicine

## 2014-08-31 MED ORDER — DOXYCYCLINE HYCLATE 100 MG PO TABS: 100.0000 mg | ORAL_TABLET | Freq: Two times a day (BID) | ORAL | Status: DC

## 2014-08-31 NOTE — Telephone Encounter (Signed)
Patient called back in.  I gave her response.

## 2014-08-31 NOTE — Telephone Encounter (Signed)
New prescription for doxycycline sent to [facility].

## 2014-08-31 NOTE — Telephone Encounter (Signed)
Pt seen last week, given abx - finished. Sinus drainage down throat, still having mucus down throat, asking for refill.

## 2014-09-01 ENCOUNTER — Ambulatory Visit: Payer: 59 | Admitting: Internal Medicine

## 2014-09-06 ENCOUNTER — Ambulatory Visit: Payer: 59 | Admitting: Internal Medicine

## 2014-09-11 ENCOUNTER — Ambulatory Visit: Payer: 59 | Admitting: Internal Medicine

## 2015-03-13 ENCOUNTER — Other Ambulatory Visit (HOSPITAL_COMMUNITY): Payer: Self-pay | Admitting: Sports Medicine

## 2015-03-13 DIAGNOSIS — S83206S Unspecified tear of unspecified meniscus, current injury, right knee, sequela: Secondary | ICD-10-CM

## 2015-03-20 ENCOUNTER — Ambulatory Visit (HOSPITAL_COMMUNITY): Payer: 59

## 2015-03-21 ENCOUNTER — Ambulatory Visit (HOSPITAL_COMMUNITY)
Admission: RE | Admit: 2015-03-21 | Discharge: 2015-03-21 | Disposition: A | Discharge status: Home / Self Care | Payer: 59 | Source: Ambulatory Visit | Attending: Sports Medicine | Admitting: Sports Medicine

## 2015-03-21 DIAGNOSIS — M25461 Effusion, right knee: Secondary | ICD-10-CM | POA: Insufficient documentation

## 2015-03-21 DIAGNOSIS — M25561 Pain in right knee: Secondary | ICD-10-CM | POA: Diagnosis present

## 2015-03-21 DIAGNOSIS — S83206S Unspecified tear of unspecified meniscus, current injury, right knee, sequela: Secondary | ICD-10-CM

## 2015-04-16 ENCOUNTER — Ambulatory Visit (INDEPENDENT_AMBULATORY_CARE_PROVIDER_SITE_OTHER): Payer: 59 | Admitting: Internal Medicine

## 2015-04-16 ENCOUNTER — Encounter: Payer: Self-pay | Admitting: Internal Medicine

## 2015-04-16 VITALS — BP 118/76 | HR 60 | Temp 97.8°F | Resp 16 | Wt 201.0 lb

## 2015-04-16 DIAGNOSIS — J029 Acute pharyngitis, unspecified: Secondary | ICD-10-CM | POA: Diagnosis not present

## 2015-04-16 MED ORDER — AMOXICILLIN 500 MG PO CAPS: 500.0000 mg | ORAL_CAPSULE | Freq: Three times a day (TID) | ORAL | Status: DC

## 2015-04-16 NOTE — Progress Notes (Signed)
   Subjective:    Patient ID: Jenny Parsons, female    DOB: [birth year], 39 y.o.   MRN: [id number]  HPI  She began to have some symptoms late 04/13/15. She awoke 7/23 with a sore throat. She's also had some discomfort in the anterior temples bilaterally. She describes some maxillary sinus area pressure. Cough has been essentially nonproductive. Intermittently she'll have some clear-white secretions. She's been using Mucinex. She describes feeling sweaty.  She has no other signs of upper respiratory tract infection or extrinsic symptoms.  Review of Systems Frontal headache, facial pain , nasal purulence, dental pain, sore throat , otic pain or otic discharge denied. No fever or chills . Extrinsic symptoms of itchy, watery eyes, sneezing, or angioedema are denied. There is no significant sputum production, wheezing,or  paroxysmal nocturnal dyspnea.    Objective:   Physical Exam   General appearance:Adequately nourished; no acute distress or increased work of breathing is present.    Lymphatic: No  lymphadenopathy about the head, neck, or axilla .  Eyes: No conjunctival inflammation or lid edema is present. There is no scleral icterus.  Ears:  External ear exam shows no significant lesions or deformities.  Otoscopic examination reveals clear canals, tympanic membranes are intact bilaterally without bulging, retraction, inflammation or discharge.  Nose:  External nasal examination shows no deformity or inflammation. Nasal mucosa are markedly erythematous without lesions or exudates No septal dislocation or deviation.No obstruction to airflow.   Oral exam: Dental hygiene is good; lips and gums are healthy appearing.There is minimal oropharyngeal erythema without exudate .  Neck:  No deformities, thyromegaly, masses, or tenderness noted.   Supple with full range of motion without pain.   Heart:  Normal rate and regular rhythm. S1 and S2 normal without gallop, murmur, click, rub or  other extra sounds.   Lungs:Chest clear to auscultation; no wheezes, rhonchi,rales ,or rubs present.  Extremities:  No cyanosis, edema, or clubbing  noted    Skin: Warm & dry w/o tenting or jaundice. No significant lesions or rash.        Assessment & Plan:  #1 pharyngitis with 0 of Centor criteria for strep throat.  #2 nasal inflammation, probably viral.  Plan: See orders and after visit summary

## 2015-04-16 NOTE — Progress Notes (Signed)
Pre visit review using our clinic review tool, if applicable. No additional management support is needed unless otherwise documented below in the visit note. 

## 2015-04-16 NOTE — Patient Instructions (Signed)
Plain Mucinex (NOT D) for thick secretions ;force NON dairy fluids .   Nasal cleansing in the shower as discussed with lather of mild shampoo.After 10 seconds wash off lather while  exhaling through nostrils. Make sure that all residual soap is removed to prevent irritation.  Flonase OR Nasacort AQ 1 spray in each nostril twice a day as needed. Use the "crossover" technique into opposite nostril spraying toward opposite ear @ 45 degree angle, not straight up into nostril.  Plain Allegra (NOT D )  160 daily , Loratidine 10 mg , OR Zyrtec 10 mg @ bedtime  as needed for itchy eyes & sneezing.   Zicam Melts or Zinc lozenges as per package label for sore throat .  Complementary options to boost immunity include  vitamin C 2000 mg daily; & Echinacea for 4-7 days.  Fill the  prescription for antibiotic if fever; discolored nasal or chest secretions; or frontal headache or facial pain  appear in the next 72 hours  

## 2015-04-24 ENCOUNTER — Telehealth: Payer: Self-pay | Admitting: Internal Medicine

## 2015-04-24 ENCOUNTER — Other Ambulatory Visit: Payer: Self-pay | Admitting: Emergency Medicine

## 2015-04-24 MED ORDER — MONTELUKAST SODIUM 10 MG PO TABS: 10.0000 mg | ORAL_TABLET | Freq: Every day | ORAL | Status: DC

## 2015-04-24 NOTE — Telephone Encounter (Signed)
Plain Mucinex (NOT D) for thick secretions ;force NON dairy fluids .   Nasal cleansing in the shower  with lather of mild shampoo.After 10 seconds wash off lather while  exhaling through nostrils. Make sure that all residual soap is removed to prevent irritation.  Fluticasone 1 spray in each nostril twice a day as needed. Use the "crossover" technique into opposite nostril spraying toward opposite ear @ 45 degree angle, not straight up into nostril.  Use a Neti pot daily only  as needed for significant sinus congestion; going from open side to congested side . Plain Allegra (NOT D )  160 daily , Loratidine 10 mg , OR Zyrtec 10 mg @ bedtime  as needed for itchy eyes & sneezing.

## 2015-04-24 NOTE — Telephone Encounter (Signed)
Patient states she is doing all of this and she is still having drainage.  Please advise.

## 2015-04-24 NOTE — Telephone Encounter (Signed)
Please advise 

## 2015-04-24 NOTE — Telephone Encounter (Signed)
Generic Singulair10 mg #30 1 qd

## 2015-04-24 NOTE — Telephone Encounter (Signed)
RX for Singular has been sent to [facility]. Tried to call pt but unable to LVM

## 2015-04-24 NOTE — Telephone Encounter (Signed)
Patient seen Dr. Alwyn Ren last Monday and she has taken all her antibiotics and her sore throat is better but still there and she still has drainage. She is wondering if there is anything else she should do.

## 2015-04-24 NOTE — Telephone Encounter (Signed)
Please advise if you would like pt to come back in.

## 2015-04-25 ENCOUNTER — Encounter: Payer: Self-pay | Admitting: Internal Medicine

## 2015-04-25 ENCOUNTER — Ambulatory Visit (INDEPENDENT_AMBULATORY_CARE_PROVIDER_SITE_OTHER)
Admission: RE | Admit: 2015-04-25 | Discharge: 2015-04-25 | Disposition: A | Discharge status: Home / Self Care | Payer: 59 | Source: Ambulatory Visit | Attending: Internal Medicine | Admitting: Internal Medicine

## 2015-04-25 ENCOUNTER — Ambulatory Visit (INDEPENDENT_AMBULATORY_CARE_PROVIDER_SITE_OTHER): Payer: 59 | Admitting: Internal Medicine

## 2015-04-25 VITALS — BP 142/92 | HR 74 | Temp 98.4°F | Resp 12 | Ht 60.0 in | Wt 204.0 lb

## 2015-04-25 DIAGNOSIS — R05 Cough: Secondary | ICD-10-CM

## 2015-04-25 DIAGNOSIS — R059 Cough, unspecified: Secondary | ICD-10-CM

## 2015-04-25 DIAGNOSIS — J40 Bronchitis, not specified as acute or chronic: Secondary | ICD-10-CM

## 2015-04-25 MED ORDER — HYDROCOD POLST-CPM POLST ER 10-8 MG/5ML PO SUER: 5.0000 mL | Freq: Two times a day (BID) | ORAL | Status: DC | PRN

## 2015-04-25 NOTE — Patient Instructions (Signed)
Cough, Adult  A cough is a reflex that helps clear your throat and airways. It can help heal the body or may be a reaction to an irritated airway. A cough may only last 2 or 3 weeks (acute) or may last more than 8 weeks (chronic).  CAUSES Acute cough:  Viral or bacterial infections. Chronic cough:  Infections.  Allergies.  Asthma.  Post-nasal drip.  Smoking.  Heartburn or acid reflux.  Some medicines.  Chronic lung problems (COPD).  Cancer. SYMPTOMS   Cough.  Fever.  Chest pain.  Increased breathing rate.  High-pitched whistling sound when breathing (wheezing).  Colored mucus that you cough up (sputum). TREATMENT   A bacterial cough may be treated with antibiotic medicine.  A viral cough must run its course and will not respond to antibiotics.  Your caregiver may recommend other treatments if you have a chronic cough. HOME CARE INSTRUCTIONS   Only take over-the-counter or prescription medicines for pain, discomfort, or fever as directed by your caregiver. Use cough suppressants only as directed by your caregiver.  Use a cold steam vaporizer or humidifier in your bedroom or home to help loosen secretions.  Sleep in a semi-upright position if your cough is worse at night.  Rest as needed.  Stop smoking if you smoke. SEEK IMMEDIATE MEDICAL CARE IF:   You have pus in your sputum.  Your cough starts to worsen.  You cannot control your cough with suppressants and are losing sleep.  You begin coughing up blood.  You have difficulty breathing.  You develop pain which is getting worse or is uncontrolled with medicine.  You have a fever. MAKE SURE YOU:   Understand these instructions.  Will watch your condition.  Will get help right away if you are not doing well or get worse. Document Released: 03/07/2011 Document Revised: 12/01/2011 Document Reviewed: 03/07/2011 ExitCare Patient Information 2015 [employer]. This information is not intended  to replace advice given to you by your health care provider. Make sure you discuss any questions you have with your health care provider.  

## 2015-04-25 NOTE — Progress Notes (Signed)
Pre visit review using our clinic review tool, if applicable. No additional management support is needed unless otherwise documented below in the visit note. 

## 2015-04-25 NOTE — Progress Notes (Signed)
Subjective:  Patient ID: Jenny Parsons, female    DOB: 01/30/1976  Age: 39 y.o. MRN: [id number]  CC: Cough   HPI Jenny Parsons presents for for evaluation of cough. It was initially productive of yellow/green phlegm but now it is productive of clear phlegm. She has had a sore throat as well. She complains of nasal congestion and postnasal drip.  Outpatient Prescriptions Prior to Visit  Medication Sig Dispense Refill  . hydrochlorothiazide (MICROZIDE) 12.5 MG capsule Take 1 capsule (12.5 mg total) by mouth daily. 90 capsule 1  . montelukast (SINGULAIR) 10 MG tablet Take 1 tablet (10 mg total) by mouth at bedtime. 30 tablet 0  . ramipril (ALTACE) 2.5 MG capsule Take 1 capsule (2.5 mg total) by mouth daily. 90 capsule 2  . amoxicillin (AMOXIL) 500 MG capsule Take 1 capsule (500 mg total) by mouth 3 (three) times daily. 21 capsule 0  . benzonatate (TESSALON) 100 MG capsule Take 1 capsule (100 mg total) by mouth 3 (three) times daily as needed for cough. 20 capsule 0  . doxycycline (VIBRA-TABS) 100 MG tablet Take 1 tablet (100 mg total) by mouth 2 (two) times daily. 20 tablet 0   No facility-administered medications prior to visit.    ROS Review of Systems  Constitutional: Negative.  Negative for fever, chills, diaphoresis, appetite change and fatigue.  HENT: Positive for congestion, postnasal drip, rhinorrhea and sore throat. Negative for facial swelling, sinus pressure, trouble swallowing and voice change.   Eyes: Negative.   Respiratory: Positive for cough. Negative for choking, chest tightness, shortness of breath, wheezing and stridor.   Cardiovascular: Negative.  Negative for chest pain.  Gastrointestinal: Negative.  Negative for abdominal pain.  Endocrine: Negative.   Genitourinary: Negative.   Musculoskeletal: Negative.   Skin: Negative.   Allergic/Immunologic: Negative.   Neurological: Negative.   Hematological: Negative.  Negative for adenopathy. Does not  bruise/bleed easily.  Psychiatric/Behavioral: Negative.     Objective:  BP 142/92 mmHg  Pulse 74  Temp(Src) 98.4 F (36.9 C) (Oral)  Resp 12  Ht 5' (1.524 m)  Wt 204 lb (92.534 kg)  BMI 39.84 kg/m2  SpO2 98%  BP Readings from Last 3 Encounters:  04/25/15 142/92  04/16/15 118/76  08/25/14 152/88    Wt Readings from Last 3 Encounters:  04/25/15 204 lb (92.534 kg)  04/16/15 201 lb (91.173 kg)  03/21/15 216 lb (97.977 kg)    Physical Exam  Constitutional: She is oriented to person, place, and time.  Non-toxic appearance. She does not have a sickly appearance. She does not appear ill. No distress.  HENT:  Right Ear: Tympanic membrane and external ear normal.  Left Ear: Tympanic membrane and external ear normal.  Nose: Nose normal. No mucosal edema or rhinorrhea. Right sinus exhibits no maxillary sinus tenderness and no frontal sinus tenderness. Left sinus exhibits no maxillary sinus tenderness and no frontal sinus tenderness.  Mouth/Throat: Oropharynx is clear and moist and mucous membranes are normal. Mucous membranes are not pale, not dry and not cyanotic. No oral lesions. No trismus in the jaw. No uvula swelling. No oropharyngeal exudate, posterior oropharyngeal edema, posterior oropharyngeal erythema or tonsillar abscesses.  Eyes: Conjunctivae are normal. Right eye exhibits no discharge. Left eye exhibits no discharge. No scleral icterus.  Neck: Normal range of motion. Neck supple. No JVD present. No tracheal deviation present. No thyromegaly present.  Cardiovascular: Normal rate, regular rhythm, normal heart sounds and intact distal pulses.  Exam reveals no gallop and  no friction rub.   No murmur heard. Pulmonary/Chest: Effort normal and breath sounds normal. No stridor. No respiratory distress. She has no wheezes. She has no rales. She exhibits no tenderness.  Abdominal: Soft. Bowel sounds are normal. She exhibits no distension and no mass. There is no tenderness. There is no  rebound and no guarding.  Musculoskeletal: Normal range of motion. She exhibits no edema or tenderness.  Lymphadenopathy:    She has no cervical adenopathy.  Neurological: She is oriented to person, place, and time.  Skin: Skin is warm and dry. No rash noted. She is not diaphoretic. No erythema. No pallor.    Lab Results  Component Value Date   WBC 8.1 12/22/2011   HGB 12.9 12/22/2011   HCT 38.3 12/22/2011   PLT 201.0 12/22/2011   GLUCOSE 84 12/01/2013   CHOL 184 12/22/2011   TRIG 104.0 12/22/2011   HDL 55.20 12/22/2011   LDLCALC 108* 12/22/2011   ALT 16 12/22/2011   AST 19 12/22/2011   NA 140 12/01/2013   K 3.5 12/01/2013   CL 103 12/01/2013   CREATININE 0.9 12/01/2013   BUN 16 12/01/2013   CO2 31 12/01/2013   TSH 2.12 12/22/2011    Dg Chest 2 View  04/25/2015   CLINICAL DATA:  Five-day history of cough and congestion, history of bronchitis and previous tobacco use  EXAM: CHEST  2 VIEW  COMPARISON:  None in PACs  FINDINGS: The lungs are adequately inflated and clear. The heart and pulmonary vascularity are normal. The mediastinum is normal in width. There is no pleural effusion. The bony thorax is unremarkable.  IMPRESSION: There is no active cardiopulmonary disease.   Electronically Signed   By: David  Swaziland M.D.   On: 04/25/2015 09:04    Assessment & Plan:   Jenny Parsons was seen today for cough.  Diagnoses and all orders for this visit:  Bronchitis- she is already being treated with amoxicillin, she appears to have a viral infection, will control the symptoms with Tussionex suspension. Orders: -     chlorpheniramine-HYDROcodone (TUSSIONEX PENNKINETIC ER) 10-8 MG/5ML SUER; Take 5 mLs by mouth every 12 (twelve) hours as needed for cough.  Cough- her chest x-ray is normal, she has a viral upper respiratory infection. Antibiotics are not indicated. Orders: -     chlorpheniramine-HYDROcodone (TUSSIONEX PENNKINETIC ER) 10-8 MG/5ML SUER; Take 5 mLs by mouth every 12 (twelve)  hours as needed for cough. -     DG Chest 2 View; Future   I have discontinued Jenny Parsons's benzonatate, doxycycline, and amoxicillin. I am also having her start on chlorpheniramine-HYDROcodone. Additionally, I am having her maintain her hydrochlorothiazide, ramipril, and montelukast.  Meds ordered this encounter  Medications  . chlorpheniramine-HYDROcodone (TUSSIONEX PENNKINETIC ER) 10-8 MG/5ML SUER    Sig: Take 5 mLs by mouth every 12 (twelve) hours as needed for cough.    Dispense:  140 mL    Refill:  0     Follow-up: Return in about 3 weeks (around 05/16/2015).  Sanda Linger, MD

## 2015-04-27 ENCOUNTER — Encounter: Payer: Self-pay | Admitting: Internal Medicine

## 2015-04-30 ENCOUNTER — Telehealth: Payer: Self-pay | Admitting: Internal Medicine

## 2015-04-30 MED ORDER — AZITHROMYCIN 500 MG PO TABS: 500.0000 mg | ORAL_TABLET | Freq: Every day | ORAL | Status: DC

## 2015-04-30 NOTE — Telephone Encounter (Signed)
Patient was in on 7/25 and 8/3 and she is still not feeling better.  She did leave a message for Dr. Yetta Barre on Mychart. She is hoping to maybe call something else in  Pharmacy is [facility] She can be reached at [phone]

## 2015-04-30 NOTE — Telephone Encounter (Signed)
Try Zithromax

## 2015-04-30 NOTE — Telephone Encounter (Signed)
Patient notified via my chart message.

## 2015-05-17 ENCOUNTER — Encounter: Payer: Self-pay | Admitting: Internal Medicine

## 2015-05-17 ENCOUNTER — Ambulatory Visit (INDEPENDENT_AMBULATORY_CARE_PROVIDER_SITE_OTHER): Payer: 59 | Admitting: Internal Medicine

## 2015-05-17 VITALS — BP 132/88 | HR 100 | Temp 98.5°F | Ht 60.0 in | Wt 198.0 lb

## 2015-05-17 DIAGNOSIS — R05 Cough: Secondary | ICD-10-CM | POA: Diagnosis not present

## 2015-05-17 DIAGNOSIS — I10 Essential (primary) hypertension: Secondary | ICD-10-CM

## 2015-05-17 DIAGNOSIS — R059 Cough, unspecified: Secondary | ICD-10-CM

## 2015-05-17 MED ORDER — METHYLPREDNISOLONE ACETATE 80 MG/ML IJ SUSP
80.0000 mg | Freq: Once | INTRAMUSCULAR | Status: AC
  Administered 2015-05-17: 80 mg via INTRAMUSCULAR

## 2015-05-17 MED ORDER — PREDNISONE 10 MG PO TABS: ORAL_TABLET | ORAL | Status: DC

## 2015-05-17 NOTE — Addendum Note (Signed)
Addended by: Anselm Jungling on: 05/17/2015 05:17 PM   Modules accepted: Orders

## 2015-05-17 NOTE — Assessment & Plan Note (Signed)
stable overall by history and exam, recent data reviewed with pt, and pt to continue medical treatment as before,  to f/u any worsening symptoms or concerns, to consider change ACE if cough persists

## 2015-05-17 NOTE — Progress Notes (Signed)
Pre visit review using our clinic review tool, if applicable. No additional management support is needed unless otherwise documented below in the visit note. 

## 2015-05-17 NOTE — Assessment & Plan Note (Addendum)
Recent cxr neg, afeb, exam with minimal findings, and seems fairly obvious to pt she has post nasal gtt, I suspect likely related to suboptimal tx allergies; for depomedrol IM, predpac asd, delsym otc prn,  to f/u any worsening symptoms or concerns, consider allergy referral and/or change ACE to other antiHTN

## 2015-05-17 NOTE — Patient Instructions (Signed)
You had the steroid shot today  Please take all new medication as prescribed - the prednisone  Please continue all other medications as before, including the flonase as well  If your cough persists, we could consider referral to allergist, and even consider changing the Altace, as this type of medication has been associated with cough as well  Please have the pharmacy call with any other refills you may need..  Please keep your appointments with your specialists as you may have planned

## 2015-05-17 NOTE — Progress Notes (Signed)
Subjective:    Patient ID: Jenny Parsons, female    DOB: July 27, 1976, 39 y.o.   MRN: [id number]  HPI  Here with ongoing cough x 4 wks, seen twice s/p antibx x 2, with some improvement, but unfortuantely still with cough, and now worsening post nasal gtt with congestion mostly clear/yellow, without ST, fever, and Pt denies chest pain, increased sob or doe, wheezing, orthopnea, PND, increased LE swelling, palpitations, dizziness or syncope.  Taking singulari and zyrtec daily, does not like to take the flonase regualrly, so has not been doing so. Taking mucinex the past wk also not overly helpful, trying to stay away from doncgestants due to HTN.   No significant reflux or asthma like symptoms. Non smoker.  Not pregnant and no plans, is taking ACE. Recent cxr clear Past Medical History  Diagnosis Date  . Hypertension    Past Surgical History  Procedure Laterality Date  . Tubal ligation      reports that she has never smoked. She has never used smokeless tobacco. She reports that she does not drink alcohol or use illicit drugs. family history includes Hypertension in her mother. There is no history of Cancer, Diabetes, or Stroke. No Known Allergies Current Outpatient Prescriptions on File Prior to Visit  Medication Sig Dispense Refill  . chlorpheniramine-HYDROcodone (TUSSIONEX PENNKINETIC ER) 10-8 MG/5ML SUER Take 5 mLs by mouth every 12 (twelve) hours as needed for cough. 140 mL 0  . hydrochlorothiazide (MICROZIDE) 12.5 MG capsule Take 1 capsule (12.5 mg total) by mouth daily. 90 capsule 1  . montelukast (SINGULAIR) 10 MG tablet Take 1 tablet (10 mg total) by mouth at bedtime. 30 tablet 0  . ramipril (ALTACE) 2.5 MG capsule Take 1 capsule (2.5 mg total) by mouth daily. 90 capsule 2  . azithromycin (ZITHROMAX) 500 MG tablet Take 1 tablet (500 mg total) by mouth daily. (Patient not taking: Reported on 05/17/2015) 3 tablet 0   No current facility-administered medications on file prior to  visit.   Review of Systems  Constitutional: Negative for unusual diaphoresis or night sweats HENT: Negative for ringing in ear or discharge Eyes: Negative for double vision or worsening visual disturbance.  Respiratory: Negative for choking and stridor.   Gastrointestinal: Negative for vomiting or other signifcant bowel change Genitourinary: Negative for hematuria or change in urine volume.  Musculoskeletal: Negative for other MSK pain or swelling Skin: Negative for color change and worsening wound.  Neurological: Negative for tremors and numbness other than noted  Psychiatric/Behavioral: Negative for decreased concentration or agitation other than above       Objective:   Physical Exam BP 132/88 mmHg  Pulse 100  Temp(Src) 98.5 F (36.9 C) (Oral)  Ht 5' (1.524 m)  Wt 198 lb (89.812 kg)  BMI 38.67 kg/m2  SpO2 98% VS noted, not ill apeparing Constitutional: Pt appears in no significant distress HENT: Head: NCAT.  Right Ear: External ear normal.  Left Ear: External ear normal.  Eyes: . Pupils are equal, round, and reactive to light. Conjunctivae and EOM are normal Bilat tm's with mild erythema.  Max sinus areas non tender.  Pharynx with mild erythema, no exudate Neck: Normal range of motion. Neck supple.  Cardiovascular: Normal rate and regular rhythm.   Pulmonary/Chest: Effort normal and breath sounds without rales or wheezing.  Abd: soft + BS, NT Neurological: Pt is alert. Not confused , motor grossly intact Skin: Skin is warm. No rash, no LE edema Psychiatric: Pt behavior is normal. No agitation.  CLINICAL DATA: Five-day history of cough and congestion, history of bronchitis and previous tobacco use  EXAM: CHEST 2 VIEW  COMPARISON: None in PACs  FINDINGS: The lungs are adequately inflated and clear. The heart and pulmonary vascularity are normal. The mediastinum is normal in width. There is no pleural effusion. The bony thorax is  unremarkable.  IMPRESSION: There is no active cardiopulmonary disease.   Electronically Signed  By: David Sw[place]iland M.D.  On: 04/25/2015 09:04    Assessment & Plan:

## 2015-05-22 ENCOUNTER — Telehealth: Payer: Self-pay | Admitting: Internal Medicine

## 2015-05-22 NOTE — Telephone Encounter (Signed)
Pt has been in the office 3 times this past month regarding her respiratory issues Last week she was given a shot of prednisone and also given a round of oral prednisone. She is also taking Musinex DM and Singulair and is still having a lot of mucus and is not feeling well She wants to confirm that she is taking the right kind of medication for this. Please call her at   [phone]

## 2015-05-22 NOTE — Telephone Encounter (Signed)
If she has a persistent cough then she needs to stop taking her blood pressure medicine called Altace

## 2015-05-22 NOTE — Telephone Encounter (Signed)
Pt states she has a mild cough and has more of a mucus issues. Pt is going to finish prednisone Dr. Jonny Ruiz prescribed and will follow up if symptoms do not improve

## 2015-05-22 NOTE — Telephone Encounter (Signed)
Please advise 

## 2015-09-20 ENCOUNTER — Ambulatory Visit (INDEPENDENT_AMBULATORY_CARE_PROVIDER_SITE_OTHER): Payer: 59 | Admitting: Family

## 2015-09-20 ENCOUNTER — Encounter: Payer: Self-pay | Admitting: Family

## 2015-09-20 VITALS — BP 130/98 | HR 66 | Temp 98.1°F | Resp 18 | Ht 60.0 in | Wt 207.4 lb

## 2015-09-20 DIAGNOSIS — J329 Chronic sinusitis, unspecified: Secondary | ICD-10-CM | POA: Insufficient documentation

## 2015-09-20 DIAGNOSIS — I10 Essential (primary) hypertension: Secondary | ICD-10-CM | POA: Diagnosis not present

## 2015-09-20 DIAGNOSIS — J320 Chronic maxillary sinusitis: Secondary | ICD-10-CM

## 2015-09-20 MED ORDER — BENZONATATE 100 MG PO CAPS: 100.0000 mg | ORAL_CAPSULE | Freq: Two times a day (BID) | ORAL | Status: DC | PRN

## 2015-09-20 MED ORDER — HYDROCHLOROTHIAZIDE 12.5 MG PO CAPS: 12.5000 mg | ORAL_CAPSULE | Freq: Every day | ORAL | Status: DC

## 2015-09-20 MED ORDER — FLUCONAZOLE 150 MG PO TABS: 150.0000 mg | ORAL_TABLET | Freq: Once | ORAL | Status: DC

## 2015-09-20 MED ORDER — AMOXICILLIN-POT CLAVULANATE 875-125 MG PO TABS: 1.0000 | ORAL_TABLET | Freq: Two times a day (BID) | ORAL | Status: DC

## 2015-09-20 NOTE — Progress Notes (Signed)
   Subjective:    Patient ID: Jenny Parsons, female    DOB: [birth year], 39 y.o.   MRN: [id number]  Chief Complaint  Patient presents with  . Cough    x2 weeks, congestion, cough, sore throat    HPI:  Jenny Parsons is a 39 y.o. female who  has a past medical history of Hypertension. and presents today  For acute office visit.  This is a new problem. Associated symptoms of congestion, cough, and sore throat that has been going on for 2 weeks. Modifying factors include Mucinex and cough medicine which has not helped. Denies fevers. Course of the symptoms originally improved, however has worsened within the past few days. Denies any recent antibiotic use.   No Known Allergies   No current outpatient prescriptions on file prior to visit.   No current facility-administered medications on file prior to visit.    Review of Systems  Constitutional: Negative for fever and chills.  HENT: Positive for congestion, sinus pressure and sore throat. Negative for ear pain.   Respiratory: Positive for cough. Negative for chest tightness and shortness of breath.   Neurological: Positive for headaches.      Objective:    BP 130/98 mmHg  Pulse 66  Temp(Src) 98.1 F (36.7 C) (Oral)  Resp 18  Ht 5' (1.524 m)  Wt 207 lb 6.4 oz (94.076 kg)  BMI 40.51 kg/m2  SpO2 98% Nursing note and vital signs reviewed.  Physical Exam  Constitutional: She is oriented to person, place, and time. She appears well-developed and well-nourished. No distress.  HENT:  Right Ear: Hearing, tympanic membrane, external ear and ear canal normal.  Left Ear: Hearing, tympanic membrane, external ear and ear canal normal.  Nose: Right sinus exhibits maxillary sinus tenderness and frontal sinus tenderness. Left sinus exhibits maxillary sinus tenderness and frontal sinus tenderness.  Mouth/Throat: Uvula is midline, oropharynx is clear and moist and mucous membranes are normal.  Neck: Neck supple.    Cardiovascular: Normal rate, regular rhythm, normal heart sounds and intact distal pulses.   Pulmonary/Chest: Effort normal and breath sounds normal.  Neurological: She is alert and oriented to person, place, and time.  Skin: Skin is warm and dry.  Psychiatric: She has a normal mood and affect. Her behavior is normal. Judgment and thought content normal.       Assessment & Plan:   Problem List Items Addressed This Visit      Cardiovascular and Mediastinum   Essential hypertension, benign - Primary   Relevant Medications   hydrochlorothiazide (MICROZIDE) 12.5 MG capsule     Respiratory   Sinusitis     Symptoms and exam consistent with maxillary sinusitis. Start Augmentin. Start Tessalon as needed for cough. Start Diflucan as needed for post antibiotic candidiasis. Continue over-the-counter medications as needed for symptom relief and supportive care. Advised patient to avoid Sudafed with history of high blood pressure. Follow-up if symptoms worsen or fail to improve.      Relevant Medications   amoxicillin-clavulanate (AUGMENTIN) 875-125 MG tablet   fluconazole (DIFLUCAN) 150 MG tablet   benzonatate (TESSALON) 100 MG capsule

## 2015-09-20 NOTE — Assessment & Plan Note (Signed)
Symptoms and exam consistent with maxillary sinusitis. Start Augmentin. Start Tessalon as needed for cough. Start Diflucan as needed for post antibiotic candidiasis. Continue over-the-counter medications as needed for symptom relief and supportive care. Advised patient to avoid Sudafed with history of high blood pressure. Follow-up if symptoms worsen or fail to improve.

## 2015-09-20 NOTE — Progress Notes (Signed)
Pre visit review using our clinic review tool, if applicable. No additional management support is needed unless otherwise documented below in the visit note. 

## 2015-09-20 NOTE — Patient Instructions (Signed)
Thank you for choosing  HealthCare.  Summary/Instructions:  Your prescription(s) have been submitted to your pharmacy or been printed and provided for you. Please take as directed and contact our office if you believe you are having problem(s) with the medication(s) or have any questions.  If your symptoms worsen or fail to improve, please contact our office for further instruction, or in case of emergency go directly to the emergency room at the closest medical facility.   General Recommendations:    Please drink plenty of fluids.  Get plenty of rest   Sleep in humidified air  Use saline nasal sprays  Netti pot   OTC Medications:  Decongestants - helps relieve congestion   Flonase (generic fluticasone) or Nasacort (generic triamcinolone) - please make sure to use the "cross-over" technique at a 45 degree angle towards the opposite eye as opposed to straight up the nasal passageway.   Sudafed (generic pseudoephedrine - Note this is the one that is available behind the pharmacy counter); Products with phenylephrine (-PE) may also be used but is often not as effective as pseudoephedrine.   If you have HIGH BLOOD PRESSURE - Coricidin HBP; AVOID any product that is -D as this contains pseudoephedrine which may increase your blood pressure.  Afrin (oxymetazoline) every 6-8 hours for up to 3 days.   Allergies - helps relieve runny nose, itchy eyes and sneezing   Claritin (generic loratidine), Allegra (fexofenidine), or Zyrtec (generic cyrterizine) for runny nose. These medications should not cause drowsiness.  Note - Benadryl (generic diphenhydramine) may be used however may cause drowsiness  Cough -   Delsym or Robitussin (generic dextromethorphan)  Expectorants - helps loosen mucus to ease removal   Mucinex (generic guaifenesin) as directed on the package.  Headaches / General Aches   Tylenol (generic acetaminophen) - DO NOT EXCEED 3 grams (3,000 mg) in a 24  hour time period  Advil/Motrin (generic ibuprofen)   Sore Throat -   Salt water gargle   Chloraseptic (generic benzocaine) spray or lozenges / Sucrets (generic dyclonine)    Sinusitis Sinusitis is redness, soreness, and inflammation of the paranasal sinuses. Paranasal sinuses are air pockets within the bones of your face (beneath the eyes, the middle of the forehead, or above the eyes). In healthy paranasal sinuses, mucus is able to drain out, and air is able to circulate through them by way of your nose. However, when your paranasal sinuses are inflamed, mucus and air can become trapped. This can allow bacteria and other germs to grow and cause infection. Sinusitis can develop quickly and last only a short time (acute) or continue over a long period (chronic). Sinusitis that lasts for more than 12 weeks is considered chronic.  CAUSES  Causes of sinusitis include:  Allergies.  Structural abnormalities, such as displacement of the cartilage that separates your nostrils (deviated septum), which can decrease the air flow through your nose and sinuses and affect sinus drainage.  Functional abnormalities, such as when the small hairs (cilia) that line your sinuses and help remove mucus do not work properly or are not present. SIGNS AND SYMPTOMS  Symptoms of acute and chronic sinusitis are the same. The primary symptoms are pain and pressure around the affected sinuses. Other symptoms include:  Upper toothache.  Earache.  Headache.  Bad breath.  Decreased sense of smell and taste.  A cough, which worsens when you are lying flat.  Fatigue.  Fever.  Thick drainage from your nose, which often is green and may   contain pus (purulent).  Swelling and warmth over the affected sinuses. DIAGNOSIS  Your health care provider will perform a physical exam. During the exam, your health care provider may:  Look in your nose for signs of abnormal growths in your nostrils (nasal  polyps).  Tap over the affected sinus to check for signs of infection.  View the inside of your sinuses (endoscopy) using an imaging device that has a light attached (endoscope). If your health care provider suspects that you have chronic sinusitis, one or more of the following tests may be recommended:  Allergy tests.  Nasal culture. A sample of mucus is taken from your nose, sent to a lab, and screened for bacteria.  Nasal cytology. A sample of mucus is taken from your nose and examined by your health care provider to determine if your sinusitis is related to an allergy. TREATMENT  Most cases of acute sinusitis are related to a viral infection and will resolve on their own within 10 days. Sometimes medicines are prescribed to help relieve symptoms (pain medicine, decongestants, nasal steroid sprays, or saline sprays).  However, for sinusitis related to a bacterial infection, your health care provider will prescribe antibiotic medicines. These are medicines that will help kill the bacteria causing the infection.  Rarely, sinusitis is caused by a fungal infection. In theses cases, your health care provider will prescribe antifungal medicine. For some cases of chronic sinusitis, surgery is needed. Generally, these are cases in which sinusitis recurs more than 3 times per year, despite other treatments. HOME CARE INSTRUCTIONS   Drink plenty of water. Water helps thin the mucus so your sinuses can drain more easily.  Use a humidifier.  Inhale steam 3 to 4 times a day (for example, sit in the bathroom with the shower running).  Apply a warm, moist washcloth to your face 3 to 4 times a day, or as directed by your health care provider.  Use saline nasal sprays to help moisten and clean your sinuses.  Take medicines only as directed by your health care provider.  If you were prescribed either an antibiotic or antifungal medicine, finish it all even if you start to feel better. SEEK IMMEDIATE  MEDICAL CARE IF:  You have increasing pain or severe headaches.  You have nausea, vomiting, or drowsiness.  You have swelling around your face.  You have vision problems.  You have a stiff neck.  You have difficulty breathing. MAKE SURE YOU:   Understand these instructions.  Will watch your condition.  Will get help right away if you are not doing well or get worse. Document Released: 09/08/2005 Document Revised: 01/23/2014 Document Reviewed: 09/23/2011 [employer] Patient Information 2015 [employer]. This information is not intended to replace advice given to you by your health care provider. Make sure you discuss any questions you have with your health care provider.   

## 2015-11-07 ENCOUNTER — Ambulatory Visit (INDEPENDENT_AMBULATORY_CARE_PROVIDER_SITE_OTHER): Payer: 59 | Admitting: Nurse Practitioner

## 2015-11-07 ENCOUNTER — Encounter: Payer: Self-pay | Admitting: Nurse Practitioner

## 2015-11-07 VITALS — BP 140/90 | HR 56 | Temp 97.8°F | Ht 60.0 in | Wt 206.5 lb

## 2015-11-07 DIAGNOSIS — J320 Chronic maxillary sinusitis: Secondary | ICD-10-CM

## 2015-11-07 MED ORDER — FLUCONAZOLE 150 MG PO TABS: 150.0000 mg | ORAL_TABLET | Freq: Once | ORAL | Status: DC

## 2015-11-07 MED ORDER — AMOXICILLIN-POT CLAVULANATE 875-125 MG PO TABS: 1.0000 | ORAL_TABLET | Freq: Two times a day (BID) | ORAL | Status: DC

## 2015-11-07 NOTE — Patient Instructions (Addendum)
Augmentin was sent to your pharmacy.   Diflucan if needed.   Rest, fluids, benadryl at night or a claritin/allegra/ or zyrtec during the day to help dry things up.   Take Care

## 2015-11-07 NOTE — Progress Notes (Signed)
Patient ID: Jenny Parsons, female    DOB: 1975/10/11  Age: 40 y.o. MRN: [id number]  CC: Sinusitis and Fever   HPI Jenny Parsons presents for CC of sinusitis x 2 weeks.   1) Maxillary pressure equal to Frontal pressure Teeth feel painful due to pressure Bad taste in mouth  Rhinorrhea- green/yellow  Coughing- green/yellow Denies fever, but has had chills and sweats 3 x in last 2 weeks.   Treatment to date: Mucinex  Sudafed Coricidin HBP   Sick contacts- Entire family   History Jenny Parsons has a past medical history of Hypertension.   She has past surgical history that includes Tubal ligation.   Her family history includes Hypertension in her mother. There is no history of Cancer, Diabetes, or Stroke.She reports that she has never smoked. She has never used smokeless tobacco. She reports that she does not drink alcohol or use illicit drugs.  Outpatient Prescriptions Prior to Visit  Medication Sig Dispense Refill  . hydrochlorothiazide (MICROZIDE) 12.5 MG capsule Take 1 capsule (12.5 mg total) by mouth daily. 90 capsule 1  . amoxicillin-clavulanate (AUGMENTIN) 875-125 MG tablet Take 1 tablet by mouth 2 (two) times daily. 20 tablet 0  . benzonatate (TESSALON) 100 MG capsule Take 1 capsule (100 mg total) by mouth 2 (two) times daily as needed for cough. 20 capsule 0  . fluconazole (DIFLUCAN) 150 MG tablet Take 1 tablet (150 mg total) by mouth once. 1 tablet 0   No facility-administered medications prior to visit.    ROS Review of Systems  Constitutional: Positive for chills and diaphoresis. Negative for fever and fatigue.  HENT: Positive for congestion, ear pain, postnasal drip, rhinorrhea, sinus pressure and sore throat. Negative for sneezing, trouble swallowing and voice change.   Respiratory: Positive for cough. Negative for chest tightness, shortness of breath and wheezing.   Cardiovascular: Negative for chest pain, palpitations and leg swelling.  Gastrointestinal:  Negative for nausea, vomiting and diarrhea.  Skin: Negative for rash.  Neurological: Positive for headaches. Negative for dizziness.   Objective:  BP 140/90 mmHg  Pulse 56  Temp(Src) 97.8 F (36.6 C) (Oral)  Ht 5' (1.524 m)  Wt 206 lb 8 oz (93.668 kg)  BMI 40.33 kg/m2  SpO2 97%  Physical Exam  Constitutional: She is oriented to person, place, and time. She appears well-developed and well-nourished. No distress.  HENT:  Head: Normocephalic and atraumatic.  Right Ear: External ear normal.  Left Ear: External ear normal.  Mouth/Throat: Oropharynx is clear and moist. No oropharyngeal exudate.  Tm's clear bilaterally Nares boggy Exquisite tenderness of maxilla and frontal sinuses  Eyes: EOM are normal. Pupils are equal, round, and reactive to light. Right eye exhibits no discharge. Left eye exhibits no discharge. No scleral icterus.  Neck: Normal range of motion. Neck supple.  Cardiovascular: Normal rate, regular rhythm and normal heart sounds.  Exam reveals no gallop and no friction rub.   No murmur heard. Pulmonary/Chest: Effort normal and breath sounds normal. No respiratory distress. She has no wheezes. She has no rales. She exhibits no tenderness.  Lymphadenopathy:    She has no cervical adenopathy.  Neurological: She is alert and oriented to person, place, and time. No cranial nerve deficit. She exhibits normal muscle tone. Coordination normal.  Skin: Skin is warm and dry. No rash noted. She is not diaphoretic.  Psychiatric: She has a normal mood and affect. Her behavior is normal. Judgment and thought content normal.   Assessment & Plan:   Jenny Parsons  was seen today for sinusitis and fever.  Diagnoses and all orders for this visit:  Maxillary sinusitis, unspecified chronicity -     amoxicillin-clavulanate (AUGMENTIN) 875-125 MG tablet; Take 1 tablet by mouth 2 (two) times daily. -     fluconazole (DIFLUCAN) 150 MG tablet; Take 1 tablet (150 mg total) by mouth once.   I  have discontinued [name]'s benzonatate. I am also having her maintain her hydrochlorothiazide, amoxicillin-clavulanate, and fluconazole.  Meds ordered this encounter  Medications  . amoxicillin-clavulanate (AUGMENTIN) 875-125 MG tablet    Sig: Take 1 tablet by mouth 2 (two) times daily.    Dispense:  14 tablet    Refill:  0    Order Specific Question:  Supervising Provider    Answer:  Duncan Dull L [[id number]]  . fluconazole (DIFLUCAN) 150 MG tablet    Sig: Take 1 tablet (150 mg total) by mouth once.    Dispense:  1 tablet    Refill:  0    Order Specific Question:  Supervising Provider    Answer:  Sherlene Shams [[id number]]     Follow-up: Return if symptoms worsen or fail to improve.

## 2015-11-07 NOTE — Progress Notes (Signed)
Pre visit review using our clinic review tool, if applicable. No additional management support is needed unless otherwise documented below in the visit note. 

## 2015-11-07 NOTE — Assessment & Plan Note (Addendum)
New Onset Due to length of symptoms with worsening will treat empirically  Augmentin was sent to the pharmacy [facility]- sent in a diflucan just in case Continue OTC measures  FU prn worsening/failure to improve.

## 2015-11-12 ENCOUNTER — Telehealth: Payer: Self-pay | Admitting: Internal Medicine

## 2015-11-12 ENCOUNTER — Other Ambulatory Visit: Payer: Self-pay | Admitting: Internal Medicine

## 2015-11-12 DIAGNOSIS — J0110 Acute frontal sinusitis, unspecified: Secondary | ICD-10-CM | POA: Insufficient documentation

## 2015-11-12 DIAGNOSIS — R51 Headache: Secondary | ICD-10-CM

## 2015-11-12 DIAGNOSIS — R519 Headache, unspecified: Secondary | ICD-10-CM | POA: Insufficient documentation

## 2015-11-12 NOTE — Telephone Encounter (Signed)
I will order a scan to see if the infection has spread outside of her sinuses

## 2015-11-12 NOTE — Telephone Encounter (Signed)
Patient called to advise that she has aboutt 2 days of amoxicillin-clavulanate (AUGMENTIN) 875-125 MG tablet [[id number]] left, and she is still experiencing a lot of pressure; above her right eye. She was wondering if there is something else that could be called in.

## 2015-11-13 ENCOUNTER — Ambulatory Visit (INDEPENDENT_AMBULATORY_CARE_PROVIDER_SITE_OTHER)
Admission: RE | Admit: 2015-11-13 | Discharge: 2015-11-13 | Disposition: A | Discharge status: Home / Self Care | Payer: 59 | Source: Ambulatory Visit | Attending: Internal Medicine | Admitting: Internal Medicine

## 2015-11-13 DIAGNOSIS — R51 Headache: Secondary | ICD-10-CM

## 2015-11-13 DIAGNOSIS — J0110 Acute frontal sinusitis, unspecified: Secondary | ICD-10-CM | POA: Diagnosis not present

## 2015-11-13 DIAGNOSIS — R519 Headache, unspecified: Secondary | ICD-10-CM

## 2015-11-13 DIAGNOSIS — J329 Chronic sinusitis, unspecified: Secondary | ICD-10-CM | POA: Diagnosis not present

## 2015-11-14 ENCOUNTER — Encounter: Payer: Self-pay | Admitting: Internal Medicine

## 2015-11-14 ENCOUNTER — Ambulatory Visit (INDEPENDENT_AMBULATORY_CARE_PROVIDER_SITE_OTHER): Payer: 59 | Admitting: Internal Medicine

## 2015-11-14 ENCOUNTER — Other Ambulatory Visit (INDEPENDENT_AMBULATORY_CARE_PROVIDER_SITE_OTHER): Payer: 59

## 2015-11-14 ENCOUNTER — Telehealth: Payer: Self-pay | Admitting: Internal Medicine

## 2015-11-14 VITALS — BP 160/100 | HR 71 | Temp 98.0°F | Resp 16 | Ht 60.0 in | Wt 207.0 lb

## 2015-11-14 DIAGNOSIS — J0101 Acute recurrent maxillary sinusitis: Secondary | ICD-10-CM | POA: Diagnosis not present

## 2015-11-14 DIAGNOSIS — J0100 Acute maxillary sinusitis, unspecified: Secondary | ICD-10-CM | POA: Insufficient documentation

## 2015-11-14 DIAGNOSIS — I10 Essential (primary) hypertension: Secondary | ICD-10-CM

## 2015-11-14 LAB — CBC WITH DIFFERENTIAL/PLATELET
Basophils Absolute: 0 10*3/uL (ref 0.0–0.1)
Basophils Relative: 0.2 % (ref 0.0–3.0)
EOS PCT: 0.9 % (ref 0.0–5.0)
Eosinophils Absolute: 0.1 10*3/uL (ref 0.0–0.7)
HCT: 36.9 % (ref 36.0–46.0)
Hemoglobin: 12.7 g/dL (ref 12.0–15.0)
LYMPHS ABS: 2.1 10*3/uL (ref 0.7–4.0)
Lymphocytes Relative: 21.9 % (ref 12.0–46.0)
MCHC: 34.3 g/dL (ref 30.0–36.0)
MCV: 84.6 fl (ref 78.0–100.0)
MONO ABS: 0.5 10*3/uL (ref 0.1–1.0)
MONOS PCT: 4.7 % (ref 3.0–12.0)
NEUTROS ABS: 7.1 10*3/uL (ref 1.4–7.7)
NEUTROS PCT: 72.3 % (ref 43.0–77.0)
PLATELETS: 209 10*3/uL (ref 150.0–400.0)
RBC: 4.37 Mil/uL (ref 3.87–5.11)
RDW: 12.5 % (ref 11.5–15.5)
WBC: 9.8 10*3/uL (ref 4.0–10.5)

## 2015-11-14 LAB — URINALYSIS, ROUTINE W REFLEX MICROSCOPIC
BILIRUBIN URINE: NEGATIVE
Hgb urine dipstick: NEGATIVE
KETONES UR: NEGATIVE
LEUKOCYTES UA: NEGATIVE
NITRITE: NEGATIVE
PH: 6 (ref 5.0–8.0)
URINE GLUCOSE: NEGATIVE
UROBILINOGEN UA: 1 (ref 0.0–1.0)

## 2015-11-14 LAB — BASIC METABOLIC PANEL
BUN: 17 mg/dL (ref 6–23)
CHLORIDE: 107 meq/L (ref 96–112)
CO2: 27 mEq/L (ref 19–32)
Calcium: 9.3 mg/dL (ref 8.4–10.5)
Creatinine, Ser: 0.65 mg/dL (ref 0.40–1.20)
GFR: 108.7 mL/min (ref >60.00)
GLUCOSE: 94 mg/dL (ref 70–99)
POTASSIUM: 3.7 meq/L (ref 3.5–5.1)
SODIUM: 139 meq/L (ref 135–145)

## 2015-11-14 LAB — T4, FREE: FREE T4: 1.05 ng/dL (ref 0.60–1.60)

## 2015-11-14 LAB — SEDIMENTATION RATE: SED RATE: 16 mm/h (ref 0–22)

## 2015-11-14 MED ORDER — CLINDAMYCIN HCL 300 MG PO CAPS
300.0000 mg | ORAL_CAPSULE | Freq: Three times a day (TID) | ORAL | Status: DC
Start: 2015-11-14 — End: 2015-12-13

## 2015-11-14 MED ORDER — AZILSARTAN-CHLORTHALIDONE 40-25 MG PO TABS: 1.0000 | ORAL_TABLET | Freq: Every day | ORAL | Status: DC

## 2015-11-14 NOTE — Progress Notes (Signed)
Subjective:  Patient ID: Jenny Parsons, female    DOB: 1975/12/26  Age: 40 y.o. MRN: [id number]  CC: Hypertension and Sinusitis   HPI [name] presents for follow-up on hypertension and recent sinus infection. She was seen here by another provider about 10 days ago for acute sinusitis and took a course of Augmentin. She did not improve on that in fact complains of worsening right-sided facial pain that radiates up to her eye. She also complains of thick nasal phlegm from her nose with postnasal drip and congestion. She has also been taking Sudafed and Allegra. She had a CT scan done a day prior to this visit that showed an air-fluid level in her right maxillary sinus.   In addition, her blood pressure is not well controlled. She was seen a few months ago for chronic cough and the ACE inhibitor was discontinued. She denies headache, blurred vision, chest pain, shortness of breath, diaphoresis, or edema. Outpatient Prescriptions Prior to Visit  Medication Sig Dispense Refill  . hydrochlorothiazide (MICROZIDE) 12.5 MG capsule Take 1 capsule (12.5 mg total) by mouth daily. 90 capsule 1  . amoxicillin-clavulanate (AUGMENTIN) 875-125 MG tablet Take 1 tablet by mouth 2 (two) times daily. 14 tablet 0  . fluconazole (DIFLUCAN) 150 MG tablet Take 1 tablet (150 mg total) by mouth once. 1 tablet 0   No facility-administered medications prior to visit.    ROS Review of Systems  Constitutional: Positive for chills. Negative for fever, diaphoresis, activity change, appetite change, fatigue and unexpected weight change.  HENT: Positive for congestion, postnasal drip, rhinorrhea, sinus pressure and sore throat. Negative for ear pain, facial swelling, nosebleeds, trouble swallowing and voice change.   Eyes: Negative.   Respiratory: Negative.  Negative for cough, choking, chest tightness, shortness of breath and stridor.   Cardiovascular: Negative.  Negative for chest pain, palpitations  and leg swelling.  Gastrointestinal: Negative.  Negative for nausea, vomiting, abdominal pain and diarrhea.  Endocrine: Negative.   Genitourinary: Negative.   Musculoskeletal: Negative.  Negative for neck pain.  Skin: Negative.   Neurological: Negative.  Negative for dizziness, tremors, weakness, light-headedness, numbness and headaches.  Hematological: Negative.  Negative for adenopathy. Does not bruise/bleed easily.  Psychiatric/Behavioral: Negative.     Objective:  BP 160/100 mmHg  Pulse 71  Temp(Src) 98 F (36.7 C) (Oral)  Resp 16  Ht 5' (1.524 m)  Wt 207 lb (93.895 kg)  BMI 40.43 kg/m2  SpO2 99%  BP Readings from Last 3 Encounters:  11/14/15 160/100  11/07/15 140/90  09/20/15 130/98    Wt Readings from Last 3 Encounters:  11/14/15 207 lb (93.895 kg)  11/07/15 206 lb 8 oz (93.668 kg)  09/20/15 207 lb 6.4 oz (94.076 kg)    Physical Exam  Constitutional: She is oriented to person, place, and time. She appears well-developed and well-nourished.  Non-toxic appearance. She does not have a sickly appearance. She does not appear ill. No distress.  HENT:  Right Ear: Hearing, tympanic membrane, external ear and ear canal normal.  Left Ear: Hearing, tympanic membrane, external ear and ear canal normal.  Nose: No mucosal edema or rhinorrhea. Right sinus exhibits maxillary sinus tenderness. Right sinus exhibits no frontal sinus tenderness. Left sinus exhibits no maxillary sinus tenderness and no frontal sinus tenderness.  Mouth/Throat: Oropharynx is clear and moist and mucous membranes are normal. Mucous membranes are not pale, not dry and not cyanotic. No oral lesions. No trismus in the jaw. No uvula swelling. No oropharyngeal  exudate, posterior oropharyngeal edema, posterior oropharyngeal erythema or tonsillar abscesses.  Eyes: Conjunctivae are normal. Right eye exhibits no discharge. Left eye exhibits no discharge. No scleral icterus.  Neck: Normal range of motion. Neck supple.  No JVD present. No tracheal deviation present. No thyromegaly present.  Cardiovascular: Normal rate, regular rhythm, normal heart sounds and intact distal pulses.  Exam reveals no gallop and no friction rub.   No murmur heard. Pulmonary/Chest: Effort normal and breath sounds normal. No stridor. No respiratory distress. She has no wheezes. She has no rales. She exhibits no tenderness.  Abdominal: Soft. Bowel sounds are normal. She exhibits no distension and no mass. There is no tenderness. There is no rebound and no guarding.  Musculoskeletal: Normal range of motion. She exhibits no edema or tenderness.  Lymphadenopathy:    She has no cervical adenopathy.  Neurological: She is alert and oriented to person, place, and time. She has normal reflexes. She displays normal reflexes. No cranial nerve deficit. She exhibits normal muscle tone. Coordination normal.  Skin: Skin is warm and dry. No rash noted. She is not diaphoretic. No erythema. No pallor.  Psychiatric: She has a normal mood and affect. Her behavior is normal. Judgment and thought content normal.  Vitals reviewed.   Lab Results  Component Value Date   WBC 8.1 12/22/2011   HGB 12.9 12/22/2011   HCT 38.3 12/22/2011   PLT 201.0 12/22/2011   GLUCOSE 84 12/01/2013   CHOL 184 12/22/2011   TRIG 104.0 12/22/2011   HDL 55.20 12/22/2011   LDLCALC 108* 12/22/2011   ALT 16 12/22/2011   AST 19 12/22/2011   NA 140 12/01/2013   K 3.5 12/01/2013   CL 103 12/01/2013   CREATININE 0.9 12/01/2013   BUN 16 12/01/2013   CO2 31 12/01/2013   TSH 2.12 12/22/2011    Ct Maxillofacial Ltd Wo Cm  11/13/2015  CLINICAL DATA:  Right maxillary and frontal sinus pressure for 3 weeks, with drainage and productive cough EXAM: CT PARANASAL SINUS LIMITED WITHOUT CONTRAST TECHNIQUE: Non-contiguous Multidetector CT images of the paranasal sinuses were obtained in a single plane without contrast. COMPARISON:  None. FINDINGS: Extensive inflammatory change right  frontal sinus which is largely opacified. Anterior and posterior ethmoid air cells on the right largely opacified. Left frontal sinus clear. Left ethmoid air cells clear. Minimal inflammatory change in both sphenoid sinuses. Moderate mucosal periosteal thickening and opacification right maxillary sinus with fluid level. Small fluid level with mild inflammatory change left maxillary sinus. IMPRESSION: Moderate paranasal sinusitis as described above right more so than left. Fluid level suggests acute sinusitis in the maxillary regions. Electronically Signed   By: Esperanza Heir M.D.   On: 11/13/2015 16:42    Assessment & Plan:   Sabria was seen today for hypertension and sinusitis.  Diagnoses and all orders for this visit:  Essential hypertension, benign- her blood pressure is not well controlled, I have asked her to discontinue the decongestants, will change her antihypertensive regimen from hydrochlorothiazide to [employer]. Will also check her labs today to screen for secondary metabolic causes of poorly controlled blood pressure. -     Basic metabolic panel; Future -     TSH; Future -     Urinalysis, Routine w reflex microscopic (not at [facility]); Future -     T4, free; Future -     CBC with Differential/Platelet; Future -     Azilsartan-Chlorthalidone (EDARBYCLOR) 40-25 MG TABS; Take 1 tablet by mouth daily.  Acute recurrent maxillary  sinusitis- will get a CBC and a sedimentation rate to be certain there isn't a systemic infection, she has failed a course of Augmentin so will treat with a course of clindamycin, due to her elevated blood pressure she will have to discontinue the Sudafed, if she does experience nasal congestion she can use Afrin nasal spray sparingly. -     Sedimentation rate; Future -     CBC with Differential/Platelet; Future -     clindamycin (CLEOCIN) 300 MG capsule; Take 1 capsule (300 mg total) by mouth 3 (three) times daily.  I have discontinued [name]'s  hydrochlorothiazide, amoxicillin-clavulanate, and fluconazole. I am also having her start on Azilsartan-Chlorthalidone and clindamycin.  Meds ordered this encounter  Medications  . Azilsartan-Chlorthalidone (EDARBYCLOR) 40-25 MG TABS    Sig: Take 1 tablet by mouth daily.    Dispense:  28 tablet    Refill:  0  . clindamycin (CLEOCIN) 300 MG capsule    Sig: Take 1 capsule (300 mg total) by mouth 3 (three) times daily.    Dispense:  30 capsule    Refill:  0     Follow-up: Return in about 4 weeks (around 12/12/2015).  Sanda Linger, MD

## 2015-11-14 NOTE — Telephone Encounter (Signed)
Patient states she spoke with you this morning.  States you were going to get with Dr. Yetta Barre.  She is requesting a call back in regards.

## 2015-11-14 NOTE — Telephone Encounter (Signed)
If you can call back before 4pm you can reach patient at [phone].  After 4pm you can reach patient at [phone]

## 2015-11-14 NOTE — Patient Instructions (Signed)
Hypertension Hypertension, commonly called high blood pressure, is when the force of blood pumping through your arteries is too strong. Your arteries are the blood vessels that carry blood from your heart throughout your body. A blood pressure reading consists of a higher number over a lower number, such as 110/72. The higher number (systolic) is the pressure inside your arteries when your heart pumps. The lower number (diastolic) is the pressure inside your arteries when your heart relaxes. Ideally you want your blood pressure below 120/80. Hypertension forces your heart to work harder to pump blood. Your arteries may become narrow or stiff. Having untreated or uncontrolled hypertension can cause heart attack, stroke, kidney disease, and other problems. RISK FACTORS Some risk factors for high blood pressure are controllable. Others are not.  Risk factors you cannot control include:   Race. You may be at higher risk if you are African American.  Age. Risk increases with age.  Gender. Men are at higher risk than women before age 45 years. After age 65, women are at higher risk than men. Risk factors you can control include:  Not getting enough exercise or physical activity.  Being overweight.  Getting too much fat, sugar, calories, or salt in your diet.  Drinking too much alcohol. SIGNS AND SYMPTOMS Hypertension does not usually cause signs or symptoms. Extremely high blood pressure (hypertensive crisis) may cause headache, anxiety, shortness of breath, and nosebleed. DIAGNOSIS To check if you have hypertension, your health care provider will measure your blood pressure while you are seated, with your arm held at the level of your heart. It should be measured at least twice using the same arm. Certain conditions can cause a difference in blood pressure between your right and left arms. A blood pressure reading that is higher than normal on one occasion does not mean that you need treatment. If  it is not clear whether you have high blood pressure, you may be asked to return on a different day to have your blood pressure checked again. Or, you may be asked to monitor your blood pressure at home for 1 or more weeks. TREATMENT Treating high blood pressure includes making lifestyle changes and possibly taking medicine. Living a healthy lifestyle can help lower high blood pressure. You may need to change some of your habits. Lifestyle changes may include:  Following the DASH diet. This diet is high in fruits, vegetables, and whole grains. It is low in salt, red meat, and added sugars.  Keep your sodium intake below 2,300 mg per day.  Getting at least 30-45 minutes of aerobic exercise at least 4 times per week.  Losing weight if necessary.  Not smoking.  Limiting alcoholic beverages.  Learning ways to reduce stress. Your health care provider may prescribe medicine if lifestyle changes are not enough to get your blood pressure under control, and if one of the following is true:  You are 18-59 years of age and your systolic blood pressure is above 140.  You are 60 years of age or older, and your systolic blood pressure is above 150.  Your diastolic blood pressure is above 90.  You have diabetes, and your systolic blood pressure is over 140 or your diastolic blood pressure is over 90.  You have kidney disease and your blood pressure is above 140/90.  You have heart disease and your blood pressure is above 140/90. Your personal target blood pressure may vary depending on your medical conditions, your age, and other factors. HOME CARE INSTRUCTIONS    Have your blood pressure rechecked as directed by your health care provider.   Take medicines only as directed by your health care provider. Follow the directions carefully. Blood pressure medicines must be taken as prescribed. The medicine does not work as well when you skip doses. Skipping doses also puts you at risk for  problems.  Do not smoke.   Monitor your blood pressure at home as directed by your health care provider. SEEK MEDICAL CARE IF:   You think you are having a reaction to medicines taken.  You have recurrent headaches or feel dizzy.  You have swelling in your ankles.  You have trouble with your vision. SEEK IMMEDIATE MEDICAL CARE IF:  You develop a severe headache or confusion.  You have unusual weakness, numbness, or feel faint.  You have severe chest or abdominal pain.  You vomit repeatedly.  You have trouble breathing. MAKE SURE YOU:   Understand these instructions.  Will watch your condition.  Will get help right away if you are not doing well or get worse.   This information is not intended to replace advice given to you by your health care provider. Make sure you discuss any questions you have with your health care provider.   Document Released: 09/08/2005 Document Revised: 01/23/2015 Document Reviewed: 07/01/2013 Elsevier Interactive Patient Education 2016 Elsevier Inc.  

## 2015-11-14 NOTE — Telephone Encounter (Signed)
Done. Noted in result notes. Closing note.

## 2015-11-14 NOTE — Progress Notes (Signed)
Pre visit review using our clinic review tool, if applicable. No additional management support is needed unless otherwise documented below in the visit note. 

## 2015-11-15 ENCOUNTER — Encounter: Payer: Self-pay | Admitting: Internal Medicine

## 2015-11-15 LAB — TSH: TSH: 1.39 u[IU]/mL (ref 0.35–4.50)

## 2015-12-06 DIAGNOSIS — Z01419 Encounter for gynecological examination (general) (routine) without abnormal findings: Secondary | ICD-10-CM | POA: Diagnosis not present

## 2015-12-06 DIAGNOSIS — Z6838 Body mass index (BMI) 38.0-38.9, adult: Secondary | ICD-10-CM | POA: Diagnosis not present

## 2015-12-12 ENCOUNTER — Ambulatory Visit: Payer: 59 | Admitting: Internal Medicine

## 2015-12-13 ENCOUNTER — Other Ambulatory Visit (INDEPENDENT_AMBULATORY_CARE_PROVIDER_SITE_OTHER): Payer: 59

## 2015-12-13 ENCOUNTER — Ambulatory Visit (INDEPENDENT_AMBULATORY_CARE_PROVIDER_SITE_OTHER): Payer: 59 | Admitting: Internal Medicine

## 2015-12-13 ENCOUNTER — Telehealth: Payer: Self-pay | Admitting: Internal Medicine

## 2015-12-13 ENCOUNTER — Other Ambulatory Visit: Payer: Self-pay | Admitting: Internal Medicine

## 2015-12-13 ENCOUNTER — Encounter: Payer: Self-pay | Admitting: Internal Medicine

## 2015-12-13 ENCOUNTER — Telehealth: Payer: Self-pay | Admitting: *Deleted

## 2015-12-13 VITALS — BP 118/74 | HR 65 | Temp 97.8°F | Resp 16 | Ht 60.0 in | Wt 203.0 lb

## 2015-12-13 DIAGNOSIS — I10 Essential (primary) hypertension: Secondary | ICD-10-CM | POA: Diagnosis not present

## 2015-12-13 DIAGNOSIS — R002 Palpitations: Secondary | ICD-10-CM

## 2015-12-13 LAB — MAGNESIUM: MAGNESIUM: 2.2 mg/dL (ref 1.5–2.5)

## 2015-12-13 LAB — BASIC METABOLIC PANEL
BUN: 16 mg/dL (ref 6–23)
CHLORIDE: 100 meq/L (ref 96–112)
CO2: 33 meq/L — AB (ref 19–32)
CREATININE: 0.74 mg/dL (ref 0.40–1.20)
Calcium: 9.8 mg/dL (ref 8.4–10.5)
GFR: 93.55 mL/min (ref >60.00)
Glucose, Bld: 97 mg/dL (ref 70–99)
POTASSIUM: 3.6 meq/L (ref 3.5–5.1)
Sodium: 138 mEq/L (ref 135–145)

## 2015-12-13 MED ORDER — POTASSIUM CHLORIDE CRYS ER 20 MEQ PO TBCR: 20.0000 meq | EXTENDED_RELEASE_TABLET | Freq: Once | ORAL | Status: DC

## 2015-12-13 MED ORDER — AZILSARTAN-CHLORTHALIDONE 40-25 MG PO TABS: 1.0000 | ORAL_TABLET | Freq: Every day | ORAL | Status: DC

## 2015-12-13 NOTE — Telephone Encounter (Signed)
Received call pt states MD since another medication in wanting to know reason. Inform pt per labs MD stated potassium was on the low normal side and want her to start taking potassium supplement...Raechel Chute/lmb

## 2015-12-13 NOTE — Telephone Encounter (Signed)
Done

## 2015-12-13 NOTE — Progress Notes (Signed)
Pre visit review using our clinic review tool, if applicable. No additional management support is needed unless otherwise documented below in the visit note. (sarah self, [employer] student  documented this record)  

## 2015-12-13 NOTE — Telephone Encounter (Signed)
Pt called stated that she did not get any sample or anything for Azilsartan-Chlorthalidone (EDARBYCLOR) 40-25 MG TABS. Pt was wondering if Dr. Yet[facility]reJones [facility].

## 2015-12-13 NOTE — Progress Notes (Signed)
Subjective:  Patient ID: Jenny Parsons, female    DOB: 03-Aug-1976  Age: 40 y.o. MRN: [id number]  CC: Hypertension and Palpitations   HPI Jenny Parsons presents for a blood pressure check. She tells me that her blood pressure has been well controlled. Her only complaint today is palpitations. Over the last week or so she has noticed her heart rate has been elevated while she is sitting and watching TV. She has not actually checked her pulse. She's had a few episodes of feeling dizzy. She has no symptoms with activity or exertion. This happens about 3-4 times a week. She denies headache, blurred vision, chest pain, shortness of breath, edema, syncope.  Outpatient Prescriptions Prior to Visit  Medication Sig Dispense Refill  . Azilsartan-Chlorthalidone (EDARBYCLOR) 40-25 MG TABS Take 1 tablet by mouth daily. 28 tablet 0  . clindamycin (CLEOCIN) 300 MG capsule Take 1 capsule (300 mg total) by mouth 3 (three) times daily. 30 capsule 0   No facility-administered medications prior to visit.    ROS Review of Systems  Constitutional: Negative.  Negative for fever, chills, diaphoresis and fatigue.  HENT: Negative.  Negative for voice change.   Eyes: Negative.  Negative for visual disturbance.  Respiratory: Negative.  Negative for cough, choking, chest tightness, shortness of breath and stridor.   Cardiovascular: Positive for palpitations. Negative for chest pain and leg swelling.  Gastrointestinal: Negative.  Negative for vomiting, abdominal pain and diarrhea.  Endocrine: Negative.   Genitourinary: Negative.   Musculoskeletal: Negative.   Skin: Negative.   Allergic/Immunologic: Negative.   Neurological: Negative.  Negative for dizziness, tremors, syncope, weakness, light-headedness and numbness.  Hematological: Negative.  Negative for adenopathy. Does not bruise/bleed easily.  Psychiatric/Behavioral: Negative.     Objective:  BP 118/74 mmHg  Pulse 65  Temp(Src) 97.8 F  (36.6 C) (Oral)  Resp 16  Ht 5' (1.524 m)  Wt 203 lb (92.08 kg)  BMI 39.65 kg/m2  SpO2 98%  BP Readings from Last 3 Encounters:  12/13/15 118/74  11/14/15 160/100  11/07/15 140/90    Wt Readings from Last 3 Encounters:  12/13/15 203 lb (92.08 kg)  11/14/15 207 lb (93.895 kg)  11/07/15 206 lb 8 oz (93.668 kg)    Physical Exam  Constitutional: She is oriented to person, place, and time. No distress.  HENT:  Mouth/Throat: Oropharynx is clear and moist. No oropharyngeal exudate.  Eyes: Conjunctivae are normal. Right eye exhibits no discharge. Left eye exhibits no discharge. No scleral icterus.  Neck: Normal range of motion. Neck supple. No JVD present. No tracheal deviation present. No thyromegaly present.  Cardiovascular: Normal rate, regular rhythm, normal heart sounds and intact distal pulses.  Exam reveals no gallop and no friction rub.   No murmur heard. EKG ---  Sinus  Rhythm  WITHIN NORMAL LIMITS  Pulmonary/Chest: Effort normal and breath sounds normal. No stridor. No respiratory distress. She has no wheezes. She has no rales. She exhibits no tenderness.  Abdominal: Soft. Bowel sounds are normal. She exhibits no distension and no mass. There is no tenderness. There is no rebound and no guarding.  Musculoskeletal: Normal range of motion. She exhibits no edema.  Lymphadenopathy:    She has no cervical adenopathy.  Neurological: She is oriented to person, place, and time.  Skin: Skin is warm and dry. No rash noted. She is not diaphoretic. No erythema. No pallor.  Vitals reviewed.   Lab Results  Component Value Date   WBC 9.8 11/14/2015  HGB 12.7 11/14/2015   HCT 36.9 11/14/2015   PLT 209.0 11/14/2015   GLUCOSE 97 12/13/2015   CHOL 184 12/22/2011   TRIG 104.0 12/22/2011   HDL 55.20 12/22/2011   LDLCALC 108* 12/22/2011   ALT 16 12/22/2011   AST 19 12/22/2011   NA 138 12/13/2015   K 3.6 12/13/2015   CL 100 12/13/2015   CREATININE 0.74 12/13/2015   BUN 16  12/13/2015   CO2 33* 12/13/2015   TSH 1.39 11/14/2015    Ct Maxillofacial Ltd Wo Cm  11/13/2015  CLINICAL DATA:  Right maxillary and frontal sinus pressure for 3 weeks, with drainage and productive cough EXAM: CT PARANASAL SINUS LIMITED WITHOUT CONTRAST TECHNIQUE: Non-contiguous Multidetector CT images of the paranasal sinuses were obtained in a single plane without contrast. COMPARISON:  None. FINDINGS: Extensive inflammatory change right frontal sinus which is largely opacified. Anterior and posterior ethmoid air cells on the right largely opacified. Left frontal sinus clear. Left ethmoid air cells clear. Minimal inflammatory change in both sphenoid sinuses. Moderate mucosal periosteal thickening and opacification right maxillary sinus with fluid level. Small fluid level with mild inflammatory change left maxillary sinus. IMPRESSION: Moderate paranasal sinusitis as described above right more so than left. Fluid level suggests acute sinusitis in the maxillary regions. Electronically Signed   By: Esperanza Heiraymond  Rubner M.D.   On: 11/13/2015 16:42    Assessment & Plan:   Jenny Parsons was seen today for hypertension and palpitations.  Diagnoses and all orders for this visit:  Palpitations- her EKG is normal today, the palpitations sound benign but she does have dizziness, will treat the relatively low potassium level and I have asked her to undergo a 48-hour Holter monitor to her to identify her heart rate when she experiences the palpitations. -     Basic metabolic panel; Future -     Magnesium; Future -     EKG 12-Lead -     Holter monitor - 48 hour; Future  Essential hypertension, benign- Her potassium level is only 3.6 so I have asked her to start a potassium supplement as I think she would feel better if the potassium level was in the 4.0-4.5 range, otherwise her blood pressure is well-controlled and her electrolytes are stable. -     Basic metabolic panel; Future -     Magnesium; Future -      Discontinue: Azilsartan-Chlorthalidone (EDARBYCLOR) 40-25 MG TABS; Take 1 tablet by mouth daily. -     potassium chloride SA (K-DUR,KLOR-CON) 20 MEQ tablet; Take 1 tablet (20 mEq total) by mouth once.   I have discontinued Jenny Parsons's Azilsartan-Chlorthalidone and clindamycin. I am also having her start on potassium chloride SA.  Meds ordered this encounter  Medications  . DISCONTD: Azilsartan-Chlorthalidone (EDARBYCLOR) 40-25 MG TABS    Sig: Take 1 tablet by mouth daily.    Dispense:  90 tablet    Refill:  1  . potassium chloride SA (K-DUR,KLOR-CON) 20 MEQ tablet    Sig: Take 1 tablet (20 mEq total) by mouth once.    Dispense:  90 tablet    Refill:  1     Follow-up: Return in about 6 weeks (around 01/24/2016).  Sanda Lingerhomas Natalie Leclaire, MD

## 2015-12-13 NOTE — Patient Instructions (Signed)

## 2015-12-19 ENCOUNTER — Telehealth: Payer: Self-pay

## 2015-12-19 NOTE — Telephone Encounter (Signed)
Patient has called [facility] this morning, and i recd call from julie/[facility]---patient has stopped taking bp med that was ordered 3/23, doesn't like the way it makes her feel, patient is continuing potassium med, but [facility] will cancel heart monitor patient was going to start, ok to cancel heart monitor per dr Yetta Barrejones, but dr Yetta Barrejones wants to see patient in order to change bp med---patient has made appt to see dr Yetta Barrejones tomorrow at 1:45

## 2015-12-20 ENCOUNTER — Ambulatory Visit (INDEPENDENT_AMBULATORY_CARE_PROVIDER_SITE_OTHER): Payer: 59 | Admitting: Internal Medicine

## 2015-12-20 ENCOUNTER — Encounter: Payer: Self-pay | Admitting: Internal Medicine

## 2015-12-20 VITALS — BP 108/78 | HR 75 | Temp 98.6°F | Resp 16 | Ht 60.0 in | Wt 207.0 lb

## 2015-12-20 DIAGNOSIS — R002 Palpitations: Secondary | ICD-10-CM

## 2015-12-20 DIAGNOSIS — I10 Essential (primary) hypertension: Secondary | ICD-10-CM | POA: Diagnosis not present

## 2015-12-20 NOTE — Progress Notes (Signed)
Pre visit review using our clinic review tool, if applicable. No additional management support is needed unless otherwise documented below in the visit note. 

## 2015-12-20 NOTE — Patient Instructions (Signed)
Hypertension Hypertension, commonly called high blood pressure, is when the force of blood pumping through your arteries is too strong. Your arteries are the blood vessels that carry blood from your heart throughout your body. A blood pressure reading consists of a higher number over a lower number, such as 110/72. The higher number (systolic) is the pressure inside your arteries when your heart pumps. The lower number (diastolic) is the pressure inside your arteries when your heart relaxes. Ideally you want your blood pressure below 120/80. Hypertension forces your heart to work harder to pump blood. Your arteries may become narrow or stiff. Having untreated or uncontrolled hypertension can cause heart attack, stroke, kidney disease, and other problems. RISK FACTORS Some risk factors for high blood pressure are controllable. Others are not.  Risk factors you cannot control include:   Race. You may be at higher risk if you are African American.  Age. Risk increases with age.  Gender. Men are at higher risk than women before age 45 years. After age 65, women are at higher risk than men. Risk factors you can control include:  Not getting enough exercise or physical activity.  Being overweight.  Getting too much fat, sugar, calories, or salt in your diet.  Drinking too much alcohol. SIGNS AND SYMPTOMS Hypertension does not usually cause signs or symptoms. Extremely high blood pressure (hypertensive crisis) may cause headache, anxiety, shortness of breath, and nosebleed. DIAGNOSIS To check if you have hypertension, your health care provider will measure your blood pressure while you are seated, with your arm held at the level of your heart. It should be measured at least twice using the same arm. Certain conditions can cause a difference in blood pressure between your right and left arms. A blood pressure reading that is higher than normal on one occasion does not mean that you need treatment. If  it is not clear whether you have high blood pressure, you may be asked to return on a different day to have your blood pressure checked again. Or, you may be asked to monitor your blood pressure at home for 1 or more weeks. TREATMENT Treating high blood pressure includes making lifestyle changes and possibly taking medicine. Living a healthy lifestyle can help lower high blood pressure. You may need to change some of your habits. Lifestyle changes may include:  Following the DASH diet. This diet is high in fruits, vegetables, and whole grains. It is low in salt, red meat, and added sugars.  Keep your sodium intake below 2,300 mg per day.  Getting at least 30-45 minutes of aerobic exercise at least 4 times per week.  Losing weight if necessary.  Not smoking.  Limiting alcoholic beverages.  Learning ways to reduce stress. Your health care provider may prescribe medicine if lifestyle changes are not enough to get your blood pressure under control, and if one of the following is true:  You are 18-59 years of age and your systolic blood pressure is above 140.  You are 60 years of age or older, and your systolic blood pressure is above 150.  Your diastolic blood pressure is above 90.  You have diabetes, and your systolic blood pressure is over 140 or your diastolic blood pressure is over 90.  You have kidney disease and your blood pressure is above 140/90.  You have heart disease and your blood pressure is above 140/90. Your personal target blood pressure may vary depending on your medical conditions, your age, and other factors. HOME CARE INSTRUCTIONS    Have your blood pressure rechecked as directed by your health care provider.   Take medicines only as directed by your health care provider. Follow the directions carefully. Blood pressure medicines must be taken as prescribed. The medicine does not work as well when you skip doses. Skipping doses also puts you at risk for  problems.  Do not smoke.   Monitor your blood pressure at home as directed by your health care provider. SEEK MEDICAL CARE IF:   You think you are having a reaction to medicines taken.  You have recurrent headaches or feel dizzy.  You have swelling in your ankles.  You have trouble with your vision. SEEK IMMEDIATE MEDICAL CARE IF:  You develop a severe headache or confusion.  You have unusual weakness, numbness, or feel faint.  You have severe chest or abdominal pain.  You vomit repeatedly.  You have trouble breathing. MAKE SURE YOU:   Understand these instructions.  Will watch your condition.  Will get help right away if you are not doing well or get worse.   This information is not intended to replace advice given to you by your health care provider. Make sure you discuss any questions you have with your health care provider.   Document Released: 09/08/2005 Document Revised: 01/23/2015 Document Reviewed: 07/01/2013 Elsevier Interactive Patient Education 2016 Elsevier Inc.  

## 2015-12-21 NOTE — Progress Notes (Signed)
Subjective:  Patient ID: Jenny Parsons, female    DOB: Jan 05, 1976  Age: 40 y.o. MRN: [id number]  CC: Hypertension   HPI Jenny Parsons presents for a BP check - when I last saw her I started her on an ARB and thiazide. She subsequently started an exercise program and complains that she developed fatigue and weakness. She found that her blood pressure was low so she stopped taking the medication. She has recovered nicely and offers no symptoms today.  Outpatient Prescriptions Prior to Visit  Medication Sig Dispense Refill  . potassium chloride SA (K-DUR,KLOR-CON) 20 MEQ tablet Take 1 tablet (20 mEq total) by mouth once. 90 tablet 1  . Azilsartan-Chlorthalidone (EDARBYCLOR) 40-25 MG TABS Take 1 tablet by mouth daily. 90 tablet 1   No facility-administered medications prior to visit.    ROS Review of Systems  Constitutional: Negative.  Negative for chills and fatigue.  HENT: Negative.   Eyes: Negative.   Respiratory: Negative.  Negative for cough, choking, chest tightness, shortness of breath and stridor.   Cardiovascular: Negative.  Negative for chest pain, palpitations and leg swelling.  Gastrointestinal: Negative.  Negative for abdominal pain.  Endocrine: Negative.   Genitourinary: Negative.   Musculoskeletal: Negative.   Allergic/Immunologic: Negative.   Neurological: Negative.  Negative for dizziness, syncope, weakness and light-headedness.  Hematological: Negative.  Negative for adenopathy. Does not bruise/bleed easily.  Psychiatric/Behavioral: Negative.   All other systems reviewed and are negative.   Objective:  BP 108/78 mmHg  Pulse 75  Temp(Src) 98.6 F (37 C) (Oral)  Resp 16  Ht 5' (1.524 m)  Wt 207 lb (93.895 kg)  BMI 40.43 kg/m2  SpO2 98%  BP Readings from Last 3 Encounters:  12/20/15 108/78  12/13/15 118/74  11/14/15 160/100    Wt Readings from Last 3 Encounters:  12/20/15 207 lb (93.895 kg)  12/13/15 203 lb (92.08 kg)  11/14/15 207 lb  (93.895 kg)    Physical Exam  Constitutional: She is oriented to person, place, and time. No distress.  HENT:  Mouth/Throat: Oropharynx is clear and moist. No oropharyngeal exudate.  Eyes: Conjunctivae are normal. Right eye exhibits no discharge. Left eye exhibits no discharge. No scleral icterus.  Neck: Normal range of motion. Neck supple. No JVD present. No tracheal deviation present. No thyromegaly present.  Cardiovascular: Normal rate, regular rhythm, normal heart sounds and intact distal pulses.  Exam reveals no gallop and no friction rub.   No murmur heard. Pulmonary/Chest: Effort normal and breath sounds normal. No stridor. No respiratory distress. She has no wheezes. She has no rales. She exhibits no tenderness.  Abdominal: Soft. Bowel sounds are normal. She exhibits no distension and no mass. There is no tenderness. There is no rebound and no guarding.  Musculoskeletal: Normal range of motion. She exhibits no edema or tenderness.  Lymphadenopathy:    She has no cervical adenopathy.  Neurological: She is oriented to person, place, and time.  Skin: Skin is warm and dry. No rash noted. She is not diaphoretic. No erythema. No pallor.  Vitals reviewed.   Lab Results  Component Value Date   WBC 9.8 11/14/2015   HGB 12.7 11/14/2015   HCT 36.9 11/14/2015   PLT 209.0 11/14/2015   GLUCOSE 97 12/13/2015   CHOL 184 12/22/2011   TRIG 104.0 12/22/2011   HDL 55.20 12/22/2011   LDLCALC 108* 12/22/2011   ALT 16 12/22/2011   AST 19 12/22/2011   NA 138 12/13/2015   K 3.6 12/13/2015  CL 100 12/13/2015   CREATININE 0.74 12/13/2015   BUN 16 12/13/2015   CO2 33* 12/13/2015   TSH 1.39 11/14/2015    Ct Maxillofacial Ltd Wo Cm  11/13/2015  CLINICAL DATA:  Right maxillary and frontal sinus pressure for 3 weeks, with drainage and productive cough EXAM: CT PARANASAL SINUS LIMITED WITHOUT CONTRAST TECHNIQUE: Non-contiguous Multidetector CT images of the paranasal sinuses were obtained in a  single plane without contrast. COMPARISON:  None. FINDINGS: Extensive inflammatory change right frontal sinus which is largely opacified. Anterior and posterior ethmoid air cells on the right largely opacified. Left frontal sinus clear. Left ethmoid air cells clear. Minimal inflammatory change in both sphenoid sinuses. Moderate mucosal periosteal thickening and opacification right maxillary sinus with fluid level. Small fluid level with mild inflammatory change left maxillary sinus. IMPRESSION: Moderate paranasal sinusitis as described above right more so than left. Fluid level suggests acute sinusitis in the maxillary regions. Electronically Signed   By: Esperanza Heir M.D.   On: 11/13/2015 16:42    Assessment & Plan:   [place] was seen today for hypertension.  Diagnoses and all orders for this visit:  Essential hypertension, benign - her blood pressure is well-controlled with no medications, she will continue to monitor her blood pressure off of medications and will let me know if she develops any concerns.  Palpitations- palpitations have resolved, she is scheduled for a cardiac event monitor.  I have discontinued Ms. Parsons's Azilsartan-Chlorthalidone. I am also having her maintain her potassium chloride SA.  No orders of the defined types were placed in this encounter.     Follow-up: Return in about 4 months (around 04/20/2016).  Sanda Linger, MD

## 2016-01-11 DIAGNOSIS — H52223 Regular astigmatism, bilateral: Secondary | ICD-10-CM | POA: Diagnosis not present

## 2016-01-11 DIAGNOSIS — H5213 Myopia, bilateral: Secondary | ICD-10-CM | POA: Diagnosis not present

## 2016-01-17 DIAGNOSIS — M25561 Pain in right knee: Secondary | ICD-10-CM | POA: Diagnosis not present

## 2016-01-17 DIAGNOSIS — M2241 Chondromalacia patellae, right knee: Secondary | ICD-10-CM | POA: Diagnosis not present

## 2016-01-17 DIAGNOSIS — M1711 Unilateral primary osteoarthritis, right knee: Secondary | ICD-10-CM | POA: Diagnosis not present

## 2016-01-24 ENCOUNTER — Ambulatory Visit: Payer: 59 | Admitting: Internal Medicine

## 2016-01-24 DIAGNOSIS — Z0289 Encounter for other administrative examinations: Secondary | ICD-10-CM

## 2016-04-07 DIAGNOSIS — N6019 Diffuse cystic mastopathy of unspecified breast: Secondary | ICD-10-CM | POA: Diagnosis not present

## 2016-06-06 ENCOUNTER — Ambulatory Visit (INDEPENDENT_AMBULATORY_CARE_PROVIDER_SITE_OTHER): Payer: 59 | Admitting: Nurse Practitioner

## 2016-06-06 ENCOUNTER — Encounter: Payer: Self-pay | Admitting: Nurse Practitioner

## 2016-06-06 VITALS — BP 126/84 | HR 84 | Temp 97.7°F | Ht 60.0 in | Wt 211.0 lb

## 2016-06-06 DIAGNOSIS — J019 Acute sinusitis, unspecified: Secondary | ICD-10-CM

## 2016-06-06 MED ORDER — AMOXICILLIN-POT CLAVULANATE 875-125 MG PO TABS: 1.0000 | ORAL_TABLET | Freq: Two times a day (BID) | ORAL | 0 refills | Status: DC

## 2016-06-06 MED ORDER — GUAIFENESIN ER 600 MG PO TB12: 600.0000 mg | ORAL_TABLET | Freq: Two times a day (BID) | ORAL | 0 refills | Status: DC | PRN

## 2016-06-06 MED ORDER — FLUTICASONE PROPIONATE 50 MCG/ACT NA SUSP: 2.0000 | Freq: Every day | NASAL | 0 refills | Status: DC

## 2016-06-06 NOTE — Patient Instructions (Signed)
Sinusitis, Adult  Sinusitis is redness, soreness, and puffiness (inflammation) of the air pockets in the bones of your face (sinuses). The redness, soreness, and puffiness can cause air and mucus to get trapped in your sinuses. This can allow germs to grow and cause an infection.   HOME CARE    Drink enough fluids to keep your pee (urine) clear or pale yellow.   Use a humidifier in your home.   Run a hot shower to create steam in the bathroom. Sit in the bathroom with the door closed. Breathe in the steam 3-4 times a day.   Put a warm, moist washcloth on your face 3-4 times a day, or as told by your doctor.   Use salt water sprays (saline sprays) to wet the thick fluid in your nose. This can help the sinuses drain.   Only take medicine as told by your doctor.  GET HELP RIGHT AWAY IF:    Your pain gets worse.   You have very bad headaches.   You are sick to your stomach (nauseous).   You throw up (vomit).   You are very sleepy (drowsy) all the time.   Your face is puffy (swollen).   Your vision changes.   You have a stiff neck.   You have trouble breathing.  MAKE SURE YOU:    Understand these instructions.   Will watch your condition.   Will get help right away if you are not doing well or get worse.     This information is not intended to replace advice given to you by your health care provider. Make sure you discuss any questions you have with your health care provider.     Document Released: 02/25/2008 Document Revised: 09/29/2014 Document Reviewed: 04/13/2012  Elsevier Interactive Patient Education 2016 [employer] Inc[employer]

## 2016-06-06 NOTE — Progress Notes (Signed)
Subjective:  Patient ID: Jenny Parsons, female    DOB: [birth year]  Age: 40 y.o. MRN: [id number]  CC: Sinusitis (congestion for about 2 weeks)   Sinusitis  This is a new problem. The current episode started 1 to 4 weeks ago. The problem has been waxing and waning since onset. There has been no fever. Jenny Parsons pain is at a severity of 7/10. The pain is severe. Associated symptoms include congestion, coughing, headaches, a hoarse voice, sinus pressure, a sore throat and swollen glands. Pertinent negatives include no chills, ear pain, neck pain, shortness of breath or sneezing. Past treatments include oral decongestants (and antihistamine). The treatment provided mild relief.    Outpatient Medications Prior to Visit  Medication Sig Dispense Refill  . potassium chloride SA (K-DUR,KLOR-CON) 20 MEQ tablet Take 1 tablet (20 mEq total) by mouth once. 90 tablet 1   No facility-administered medications prior to visit.     ROS See HPI  Objective:  BP 126/84 (BP Location: Left Arm, Patient Position: Sitting, Cuff Size: Large)   Pulse 84   Temp 97.7 F (36.5 C)   Ht 5' (1.524 m)   Wt 211 lb (95.7 kg)   SpO2 98%   BMI 41.21 kg/m   BP Readings from Last 3 Encounters:  06/06/16 126/84  12/20/15 108/78  12/13/15 118/74    Wt Readings from Last 3 Encounters:  06/06/16 211 lb (95.7 kg)  12/20/15 207 lb (93.9 kg)  12/13/15 203 lb (92.1 kg)    Physical Exam  Constitutional: She is oriented to person, place, and time.  HENT:  Right Ear: External ear normal. No mastoid tenderness. Tympanic membrane is not erythematous. A middle ear effusion is present.  Left Ear: External ear normal. No mastoid tenderness. Tympanic membrane is not erythematous. A middle ear effusion is present.  Nose: Mucosal edema and rhinorrhea present. Right sinus exhibits no maxillary sinus tenderness and no frontal sinus tenderness. Left sinus exhibits no maxillary sinus tenderness and no frontal sinus tenderness.    Mouth/Throat: Uvula is midline. Posterior oropharyngeal erythema present. No oropharyngeal exudate.  Eyes: No scleral icterus.  Neck: Normal range of motion. Neck supple. No thyromegaly present.  Cardiovascular: Normal rate, regular rhythm and normal heart sounds.   Pulmonary/Chest: Effort normal and breath sounds normal.  Musculoskeletal: She exhibits no edema.  Lymphadenopathy:    She has cervical adenopathy.  Neurological: She is alert and oriented to person, place, and time.  Skin: Skin is warm and dry.  Psychiatric: She has a normal mood and affect. Jenny Parsons behavior is normal.  Vitals reviewed.   Lab Results  Component Value Date   WBC 9.8 11/14/2015   HGB 12.7 11/14/2015   HCT 36.9 11/14/2015   PLT 209.0 11/14/2015   GLUCOSE 97 12/13/2015   CHOL 184 12/22/2011   TRIG 104.0 12/22/2011   HDL 55.20 12/22/2011   LDLCALC 108 (H) 12/22/2011   ALT 16 12/22/2011   AST 19 12/22/2011   NA 138 12/13/2015   K 3.6 12/13/2015   CL 100 12/13/2015   CREATININE 0.74 12/13/2015   BUN 16 12/13/2015   CO2 33 (H) 12/13/2015   TSH 1.39 11/14/2015    Ct Maxillofacial Ltd Wo Cm  Result Date: 11/13/2015 CLINICAL DATA:  Right maxillary and frontal sinus pressure for 3 weeks, with drainage and productive cough EXAM: CT PARANASAL SINUS LIMITED WITHOUT CONTRAST TECHNIQUE: Non-contiguous Multidetector CT images of the paranasal sinuses were obtained in a single plane without contrast. COMPARISON:  None. FINDINGS:  Extensive inflammatory change right frontal sinus which is largely opacified. Anterior and posterior ethmoid air cells on the right largely opacified. Left frontal sinus clear. Left ethmoid air cells clear. Minimal inflammatory change in both sphenoid sinuses. Moderate mucosal periosteal thickening and opacification right maxillary sinus with fluid level. Small fluid level with mild inflammatory change left maxillary sinus. IMPRESSION: Moderate paranasal sinusitis as described above right more  so than left. Fluid level suggests acute sinusitis in the maxillary regions. Electronically Signed   By: Esperanza Heir M.D.   On: 11/13/2015 16:42    Assessment & Plan:   Teagan was seen today for sinusitis.  Diagnoses and all orders for this visit:  Acute sinusitis, recurrence not specified, unspecified location -     fluticasone (FLONASE) 50 MCG/ACT nasal spray; Place 2 sprays into both nostrils daily. -     guaiFENesin (MUCINEX) 600 MG 12 hr tablet; Take 1 tablet (600 mg total) by mouth 2 (two) times daily as needed for cough or to loosen phlegm. -     amoxicillin-clavulanate (AUGMENTIN) 875-125 MG tablet; Take 1 tablet by mouth 2 (two) times daily.   I am having Jenny Parsons start on fluticasone, guaiFENesin, and amoxicillin-clavulanate. I am also having Jenny Parsons maintain Jenny Parsons potassium chloride SA and cetirizine.  Meds ordered this encounter  Medications  . cetirizine (ZYRTEC) 10 MG tablet    Sig: Take 10 mg by mouth daily.  . fluticasone (FLONASE) 50 MCG/ACT nasal spray    Sig: Place 2 sprays into both nostrils daily.    Dispense:  16 g    Refill:  0    Order Specific Question:   Supervising Provider    Answer:   Tresa Garter [[id number]]  . guaiFENesin (MUCINEX) 600 MG 12 hr tablet    Sig: Take 1 tablet (600 mg total) by mouth 2 (two) times daily as needed for cough or to loosen phlegm.    Dispense:  14 tablet    Refill:  0    Order Specific Question:   Supervising Provider    Answer:   Tresa Garter [[id number]]  . amoxicillin-clavulanate (AUGMENTIN) 875-125 MG tablet    Sig: Take 1 tablet by mouth 2 (two) times daily.    Dispense:  14 tablet    Refill:  0    Order Specific Question:   Supervising Provider    Answer:   Tresa Garter [[id number]]    Follow-up: Return if symptoms worsen or fail to improve.  Alysia Penna, NP

## 2016-06-06 NOTE — Progress Notes (Signed)
Pre visit review using our clinic review tool, if applicable. No additional management support is needed unless otherwise documented below in the visit note. 

## 2016-06-13 ENCOUNTER — Telehealth: Payer: Self-pay | Admitting: Internal Medicine

## 2016-06-13 NOTE — Telephone Encounter (Signed)
Routing to [place]---please advise, I will call patient back, thanks 

## 2016-06-13 NOTE — Telephone Encounter (Signed)
Patient states she Jenny Parsons put her on an antibiotic that she has completed but is still not feeling better.  She states that she was told to use mucinex for congestion.  States this is not helping.  Would like to know what else she can try.

## 2016-06-16 ENCOUNTER — Other Ambulatory Visit: Payer: Self-pay | Admitting: Nurse Practitioner

## 2016-06-16 DIAGNOSIS — J019 Acute sinusitis, unspecified: Secondary | ICD-10-CM

## 2016-06-16 MED ORDER — METHYLPREDNISOLONE 4 MG PO TBPK: ORAL_TABLET | ORAL | 0 refills | Status: DC

## 2016-06-16 NOTE — Telephone Encounter (Signed)
Left message advising patient that new rx has been sent to pharm---prednisone---can call back if questions or if this med does not work---can talk with tamara

## 2016-06-16 NOTE — Telephone Encounter (Signed)
Sent prescription for oral prednisone. Thank you

## 2016-06-18 DIAGNOSIS — M170 Bilateral primary osteoarthritis of knee: Secondary | ICD-10-CM | POA: Diagnosis not present

## 2016-06-18 DIAGNOSIS — S83241A Other tear of medial meniscus, current injury, right knee, initial encounter: Secondary | ICD-10-CM | POA: Diagnosis not present

## 2016-06-20 DIAGNOSIS — M25561 Pain in right knee: Secondary | ICD-10-CM | POA: Diagnosis not present

## 2016-06-23 DIAGNOSIS — M170 Bilateral primary osteoarthritis of knee: Secondary | ICD-10-CM | POA: Diagnosis not present

## 2016-07-02 DIAGNOSIS — M170 Bilateral primary osteoarthritis of knee: Secondary | ICD-10-CM | POA: Diagnosis not present

## 2016-07-11 DIAGNOSIS — M170 Bilateral primary osteoarthritis of knee: Secondary | ICD-10-CM | POA: Diagnosis not present

## 2016-08-08 DIAGNOSIS — M170 Bilateral primary osteoarthritis of knee: Secondary | ICD-10-CM | POA: Diagnosis not present

## 2016-10-15 DIAGNOSIS — M1711 Unilateral primary osteoarthritis, right knee: Secondary | ICD-10-CM | POA: Diagnosis not present

## 2016-10-21 ENCOUNTER — Ambulatory Visit: Payer: 59 | Attending: Sports Medicine

## 2016-10-21 DIAGNOSIS — G8929 Other chronic pain: Secondary | ICD-10-CM | POA: Insufficient documentation

## 2016-10-21 DIAGNOSIS — M25561 Pain in right knee: Secondary | ICD-10-CM | POA: Insufficient documentation

## 2016-10-21 DIAGNOSIS — M222X2 Patellofemoral disorders, left knee: Secondary | ICD-10-CM | POA: Diagnosis not present

## 2016-10-21 DIAGNOSIS — M1712 Unilateral primary osteoarthritis, left knee: Secondary | ICD-10-CM | POA: Diagnosis not present

## 2016-10-21 DIAGNOSIS — R262 Difficulty in walking, not elsewhere classified: Secondary | ICD-10-CM | POA: Diagnosis not present

## 2016-10-21 NOTE — Patient Instructions (Signed)
Instructed she could shower with McConnel tape but to remove if skin irritated and/or she has more pain.

## 2016-10-21 NOTE — Therapy (Signed)
[facility] [address] [place], [place], [address] Phone: [phone]   Fax:  [phone]  Physical Therapy Evaluation  Patient Details  Name: Jenny Parsons MRN: [id number] Date of Birth: 09/06/1976 Referring Provider: Davy Pique, MD  Encounter Date: 10/21/2016      PT End of Session - 10/21/16 1752    Visit Number 1   Number of Visits 12   Date for PT Re-Evaluation 11/28/16   Authorization Type UMR   PT Start Time 0346   PT Stop Time 0430   PT Time Calculation (min) 44 min   Activity Tolerance Patient tolerated treatment well;No increased pain   Behavior During Therapy WFL for tasks assessed/performed      Past Medical History:  Diagnosis Date  . Hypertension     Past Surgical History:  Procedure Laterality Date  . TUBAL LIGATION      There were no vitals filed for this visit.       Subjective Assessment - 10/21/16 1553    Subjective She reports onset of LT knee pain with gel injections to both knees with no improvement inRT knee . Cortisone injection RT knee with 40% improvement. She reports fall on knees years ago. .. PT for 3 weeks and may have surgery..     Limitations --  squatting,   sit with legs felxed under body, stairs painful.   decreased balance after sitting or lying for long period   How long can you sit comfortably? As needed   How long can you stand comfortably? As needed   How long can you walk comfortably? As needed   Diagnostic tests MRI negative, Xrays  : patlla tilt  or lateral position   Patient Stated Goals She wants to have decreased pian and more mobility in Knee   Currently in Pain? Yes   Pain Score 6    Pain Location Knee   Pain Orientation Right   Pain Descriptors / Indicators Aching;Constant;Tightness   Pain Type Chronic pain   Pain Onset More than a month ago   Pain Frequency Constant   Aggravating Factors  See above   Pain Relieving Factors Elevation and ice ,  meds   Multiple Pain Sites No            [facility] PT Assessment - 10/21/16 0001      Assessment   Medical Diagnosis Rt knee OA , PFSS   Referring Provider Davy Pique, MD   Onset Date/Surgical Date --  2 years   Next MD Visit 11/12/16   Prior Therapy no     Precautions   Precautions None     Restrictions   Weight Bearing Restrictions No     Balance Screen   Has the patient fallen in the past 6 months No   Has the patient had a decrease in activity level because of a fear of falling?  No   Is the patient reluctant to leave their home because of a fear of falling?  No     Home Environment   Living Environment Private residence   Type of Home House   Home Access Stairs to enter   Entrance Stairs-Number of Steps 2   Home Layout Two level;Laundry or work area in basement   Alternate Teacher, music of Steps 12   Alternate Level Stairs-Rails None     Prior Function   Level of Independence Independent     Cognition   Overall Cognitive Status Within Functional Limits  for tasks assessed     ROM / Strength   AROM / PROM / Strength AROM;Strength     AROM   AROM Assessment Site Knee   Right/Left Knee Right;Left   Right Knee Extension 0   Right Knee Flexion 120   Left Knee Extension 0   Left Knee Flexion 120     Strength   Strength Assessment Site Knee   Right/Left Knee Right;Left   Right Knee Flexion 4+/5   Right Knee Extension 4/5   Left Knee Flexion 5/5   Left Knee Extension 5/5     Flexibility   Soft Tissue Assessment /Muscle Length yes   Hamstrings 60 degrees bilaterally                           PT Education - 10/21/16 1752    Education provided Yes   Education Details POC, manaagement of tape   Person(s) Educated Patient   Methods Explanation   Comprehension Verbalized understanding          PT Short Term Goals - 10/21/16 1755      PT SHORT TERM GOAL #1   Title She will be independnet with inital HEP   Time 3    Period Weeks   Status New     PT SHORT TERM GOAL #2   Title she will repor 30% decr pain with normal activity during day   Time 3   Period Weeks   Status New           PT Long Term Goals - 10/21/16 1756      PT LONG TERM GOAL #1   Title She will be independnet with all HEP issued   Time 6   Period Weeks   Status New     PT LONG TERM GOAL #2   Title She will report pain improved 60% or more with all daily activity   Time 6   Period Weeks   Status New     PT LONG TERM GOAL #3   Title She will report improved balance after standing from prolonged sitting or lying down   Time 6   Period Weeks   Status New     PT LONG TERM GOAL #4   Title She will report 50% improvement with walking stairs at home   Time 6   Period Weeks   Status New               Plan - 10/21/16 1753    Clinical Impression Statement MS Jarold Mottoatterson presents for low complexity eval for chronic knee  pain and weakness. bilaterally but in RT knee now after gel injections and cortisone injection   Rehab Potential Good   PT Frequency 2x / week   PT Duration 6 weeks   PT Treatment/Interventions Cryotherapy;Iontophoresis 4mg /ml Dexamethasone;Moist Heat;Ultrasound;Passive range of motion;Patient/family education;Taping;Manual techniques;Therapeutic exercise   PT Next Visit Plan REview taping and modify as needed, strengthening , modalities as needed   Consulted and Agree with Plan of Care Patient      Patient will benefit from skilled therapeutic intervention in order to improve the following deficits and impairments:  Decreased strength, Pain, Decreased activity tolerance, Difficulty walking  Visit Diagnosis: Osteoarthritis of left knee, unspecified osteoarthritis type - Plan: PT plan of care cert/re-cert  Patellofemoral pain syndrome of left knee - Plan: PT plan of care cert/re-cert  Chronic pain of right knee - Plan: PT plan of care cert/re-cert  Difficulty walking down stairs - Plan: PT plan  of care cert/re-cert  Difficulty walking up stairs - Plan: PT plan of care cert/re-cert     Problem List Patient Active Problem List   Diagnosis Date Noted  . Palpitations 12/13/2015  . Essential hypertension, benign 12/22/2011  . Routine general medical examination at a health care facility 12/22/2011    Caprice Red  PT 10/21/2016, 6:02 PM  [facility] [address] [place], [place], [address] Phone: [phone]   Fax:  [phone]  Name: CAMDYN LADEN MRN: [id number] Date of Birth: [birth year]

## 2016-10-29 ENCOUNTER — Ambulatory Visit: Payer: 59 | Attending: Sports Medicine | Admitting: Physical Therapy

## 2016-10-29 DIAGNOSIS — R262 Difficulty in walking, not elsewhere classified: Secondary | ICD-10-CM | POA: Insufficient documentation

## 2016-10-29 DIAGNOSIS — M25561 Pain in right knee: Secondary | ICD-10-CM | POA: Insufficient documentation

## 2016-10-29 DIAGNOSIS — G8929 Other chronic pain: Secondary | ICD-10-CM | POA: Insufficient documentation

## 2016-10-29 DIAGNOSIS — M1712 Unilateral primary osteoarthritis, left knee: Secondary | ICD-10-CM | POA: Diagnosis not present

## 2016-10-29 DIAGNOSIS — M222X2 Patellofemoral disorders, left knee: Secondary | ICD-10-CM | POA: Insufficient documentation

## 2016-10-29 NOTE — Therapy (Signed)
[facility] [address] [place], [place], [address] Phone: [phone]   Fax:  [phone]  Physical Therapy Treatment  Patient Details  Name: Jenny Parsons MRN: [id number] Date of Birth: 1975-09-29 Referring Provider: Davy Piqueebbeca Bassett, MD  Encounter Date: 10/29/2016      PT End of Session - 10/29/16 1632    Visit Number 2   Number of Visits 12   Date for PT Re-Evaluation 11/28/16   PT Start Time 1547   PT Stop Time 1631   PT Time Calculation (min) 44 min   Activity Tolerance Patient tolerated treatment well   Behavior During Therapy [facility] for tasks assessed/performed      Past Medical History:  Diagnosis Date  . Hypertension     Past Surgical History:  Procedure Laterality Date  . TUBAL LIGATION      There were no vitals filed for this visit.      Subjective Assessment - 10/29/16 1550    Subjective "I feel like the tape helped with support, I think it helped with the pain but tough to say"    Currently in Pain? Yes   Pain Score 5   took tylenol 2 x   Pain Location Knee   Pain Orientation Right   Pain Descriptors / Indicators Aching;Sore   Pain Type Chronic pain   Pain Onset More than a month ago   Pain Frequency Constant   Aggravating Factors  stairs, walking/ standing, working   Pain Relieving Factors elevation, ice, meds                         [facility] Adult PT Treatment/Exercise - 10/29/16 1608      Knee/Hip Exercises: Standing   Stairs walking up/down 3 inch steps x 6   verbal cues for proper form     Knee/Hip Exercises: Seated   Long Arc Quad Strengthening;Right;1 set;10 reps  with ball squeeze and controlled eccentrics     Knee/Hip Exercises: Supine   Short Arc Quad Sets Strengthening;Right;2 sets;15 reps  3#   Short Arc Quad Sets Limitations with  ball squeeze to facilitate quad VMO activation     Knee/Hip Exercises: Sidelying   Hip ABduction AROM;Strengthening;2  sets;15 reps     Manual Therapy   Manual Therapy Taping;Myofascial release   Manual therapy comments manual trigger point release x 3 along the vastus lateralis   McConnell R knee, Lateral > Medial pull with added superior pull medial and inferior pull laterally  reported better support                PT Education - 10/29/16 1632    Education provided Yes   Education Details benefits of mcConnel taping and biomechanics of using stairs   Person(s) Educated Patient   Methods Explanation;Verbal cues   Comprehension Verbalized understanding;Verbal cues required          PT Short Term Goals - 10/21/16 1755      PT SHORT TERM GOAL #1   Title She will be independnet with inital HEP   Time 3   Period Weeks   Status New     PT SHORT TERM GOAL #2   Title she will repor 30% decr pain with normal activity during day   Time 3   Period Weeks   Status New           PT Long Term Goals - 10/21/16 1756      PT  LONG TERM GOAL #1   Title She will be independnet with all HEP issued   Time 6   Period Weeks   Status New     PT LONG TERM GOAL #2   Title She will report pain improved 60% or more with all daily activity   Time 6   Period Weeks   Status New     PT LONG TERM GOAL #3   Title She will report improved balance after standing from prolonged sitting or lying down   Time 6   Period Weeks   Status New     PT LONG TERM GOAL #4   Title She will report 50% improvement with walking stairs at home   Time 6   Period Weeks   Status New               Plan - 10/29/16 1632    Clinical Impression Statement Ms Jarold Motto reports pain at 5/10 today. Following manual trigger point release over the vastus lateralis and SAQ and LAQ with ball squeeze she reported decreased pain. continued McConnel taping adding rotational taping rotating apex medially. post session she reproted pain dropped to 3/10 and denied modalities.    PT Next Visit Plan assess taping, and modify  as needed, strengthening , modalities as needed, VMO strengthening, manual over vastus lateralis, hip strengthening, gait/stair training.    Consulted and Agree with Plan of Care Patient      Patient will benefit from skilled therapeutic intervention in order to improve the following deficits and impairments:  Decreased strength, Pain, Decreased activity tolerance, Difficulty walking  Visit Diagnosis: Osteoarthritis of left knee, unspecified osteoarthritis type  Patellofemoral pain syndrome of left knee  Chronic pain of right knee  Difficulty walking down stairs  Difficulty walking up stairs     Problem List Patient Active Problem List   Diagnosis Date Noted  . Palpitations 12/13/2015  . Essential hypertension, benign 12/22/2011  . Routine general medical examination at a health care facility 12/22/2011   Jenny Parsons PT, DPT, LAT, ATC  10/29/16  4:37 PM      [facility] [address] [place], [place], [address] Phone: [phone]   Fax:  [phone]  Name: Jenny Parsons MRN: [id number] Date of Birth: 1975/10/26

## 2016-10-30 ENCOUNTER — Encounter: Payer: 59 | Admitting: Physical Therapy

## 2016-10-30 ENCOUNTER — Ambulatory Visit: Payer: 59 | Admitting: Physical Therapy

## 2016-10-30 DIAGNOSIS — M1712 Unilateral primary osteoarthritis, left knee: Secondary | ICD-10-CM | POA: Diagnosis not present

## 2016-10-30 DIAGNOSIS — G8929 Other chronic pain: Secondary | ICD-10-CM

## 2016-10-30 DIAGNOSIS — M222X2 Patellofemoral disorders, left knee: Secondary | ICD-10-CM | POA: Diagnosis not present

## 2016-10-30 DIAGNOSIS — M25561 Pain in right knee: Secondary | ICD-10-CM | POA: Diagnosis not present

## 2016-10-30 DIAGNOSIS — R262 Difficulty in walking, not elsewhere classified: Secondary | ICD-10-CM

## 2016-10-30 NOTE — Therapy (Addendum)
[place], [place], [address] Phone: [phone]   Fax:  [phone]  Physical Therapy Treatment/Discharge  Patient Details  Name: Jenny Parsons MRN: [id number] Date of Birth: 06-01-1976 Referring Provider: Lucila Maine, MD  Encounter Date: 10/30/2016      PT End of Session - 10/30/16 1325    Visit Number 3   Number of Visits 12   Date for PT Re-Evaluation 11/28/16   PT Start Time 0733   PT Stop Time 0815   PT Time Calculation (min) 42 min   Activity Tolerance Patient tolerated treatment well   Behavior During Therapy [facility] for tasks assessed/performed      Past Medical History:  Diagnosis Date  . Hypertension     Past Surgical History:  Procedure Laterality Date  . TUBAL LIGATION      There were no vitals filed for this visit.      Subjective Assessment - 10/30/16 1318    Subjective Tape seems to support knee.  It is still on.    Currently in Pain? Yes   Pain Score --  mild,  it gets worse as day goes on   Pain Location Knee   Pain Orientation Right   Pain Descriptors / Indicators Sore   Pain Type Chronic pain   Pain Frequency Constant   Aggravating Factors  stairs, standing, sitting too long,  working   Pain Relieving Factors tape, ice meds   Multiple Pain Sites --  mild back pain now                         [facility] Adult PT Treatment/Exercise - 10/30/16 0001      Self-Care   Self-Care --  ADL handout for home reading     Knee/Hip Exercises: Stretches   Passive Hamstring Stretch 1 rep;20 seconds   Piriformis Stretch 1 rep;10 seconds  right, stiff   Other Knee/Hip Stretches knee to chest 3 x 10 seconds right, 1 X left,  stiff  HEP     Cryotherapy   Number Minutes Cryotherapy 10 Minutes   Cryotherapy Location Knee   Type of Cryotherapy --  cold pack, elevated     Manual Therapy   Manual therapy comments retrograde soft tissue work to [employer]  edema , tennis ball issued for manual to thigh                PT Education - 10/30/16 1324    Education provided Yes   Education Details HEP,  ADL   Person(s) Educated Patient   Methods Explanation;Demonstration;Tactile cues;Verbal cues;Handout   Comprehension Verbalized understanding;Returned demonstration          PT Short Term Goals - 10/30/16 1327      PT SHORT TERM GOAL #1   Title She will be independnet with inital HEP   Time 3   Period Weeks   Status On-going     PT SHORT TERM GOAL #2   Title she will repor 30% decr pain with normal activity during day   Baseline improving no % given   Time 3   Period Weeks   Status On-going           PT Long Term Goals - 10/21/16 1756      PT LONG TERM GOAL #1   Title She will be independnet with all HEP issued   Time 6   Period Weeks   Status New  PT LONG TERM GOAL #2   Title She will report pain improved 60% or more with all daily activity   Time 6   Period Weeks   Status New     PT LONG TERM GOAL #3   Title She will report improved balance after standing from prolonged sitting or lying down   Time 6   Period Weeks   Status New     PT LONG TERM GOAL #4   Title She will report 50% improvement with walking stairs at home   Time 6   Period Weeks   Status New               Plan - 10/30/16 1325    Clinical Impression Statement Patient is a littla sore post session due to manual.  Tape is helpful.  Girth patella 45.5 cm. Able to progress stretching,  her hip is tight.     PT Next Visit Plan continue taping, and modify as needed, strengthening , modalities as needed, VMO strengthening, manual over vastus lateralis, hip strengthening, gait/stair training.    PT Home Exercise Plan piriformis , single knee to chest stretch   Consulted and Agree with Plan of Care Patient      Patient will benefit from skilled therapeutic intervention in order to improve the following deficits and impairments:   Decreased strength, Pain, Decreased activity tolerance, Difficulty walking  Visit Diagnosis: Osteoarthritis of left knee, unspecified osteoarthritis type  Patellofemoral pain syndrome of left knee  Chronic pain of right knee  Difficulty walking down stairs  Difficulty walking up stairs     Problem List Patient Active Problem List   Diagnosis Date Noted  . Palpitations 12/13/2015  . Essential hypertension, benign 12/22/2011  . Routine general medical examination at a health care facility 12/22/2011    Yana Schorr,KARENPTA 10/30/2016, 1:29 PM  [facility] [address] [place], [place], [address] Phone: [phone]   Fax:  [phone]  Name: Jenny Parsons MRN: [id number] Date of Birth: 08-Apr-1976  PHYSICAL THERAPY DISCHARGE SUMMARY  Visits from Start of Care: 3  Current functional level related to goals / functional outcomes: Unknown as she did not return after this visit   Remaining deficits: Unknown   Education / Equipment: HEP  Plan:                                                    Patient goals were not met. Patient is being discharged due to not returning since the last visit.  ?????    Noralee Stain PT 01/26/17         2:55 PM

## 2016-10-30 NOTE — Patient Instructions (Addendum)

## 2016-10-31 ENCOUNTER — Ambulatory Visit (INDEPENDENT_AMBULATORY_CARE_PROVIDER_SITE_OTHER): Payer: 59 | Admitting: Family Medicine

## 2016-10-31 ENCOUNTER — Encounter: Payer: Self-pay | Admitting: Family Medicine

## 2016-10-31 VITALS — BP 180/110 | HR 82 | Temp 98.0°F | Ht 60.0 in | Wt 215.4 lb

## 2016-10-31 DIAGNOSIS — L089 Local infection of the skin and subcutaneous tissue, unspecified: Secondary | ICD-10-CM

## 2016-10-31 DIAGNOSIS — L723 Sebaceous cyst: Secondary | ICD-10-CM | POA: Diagnosis not present

## 2016-10-31 DIAGNOSIS — I10 Essential (primary) hypertension: Secondary | ICD-10-CM

## 2016-10-31 MED ORDER — DOXYCYCLINE HYCLATE 100 MG PO CAPS
100.0000 mg | ORAL_CAPSULE | Freq: Two times a day (BID) | ORAL | 0 refills | Status: DC
Start: 2016-10-31 — End: 2016-12-03

## 2016-10-31 MED ORDER — HYDROCHLOROTHIAZIDE 25 MG PO TABS: 25.0000 mg | ORAL_TABLET | Freq: Every day | ORAL | 3 refills | Status: DC

## 2016-10-31 NOTE — Patient Instructions (Signed)
Warm compresses several times daily to back I think you likely have infected epidermal cyst This may come to a head and need to be lanced- if becoming more fluctuance. Try to hold Diclofenac as much as possible. Get back on HCTZ and set up follow up with Dr Yetta BarreJones in 1-2 weeks.

## 2016-10-31 NOTE — Progress Notes (Signed)
Subjective:     Patient ID: Jenny Parsons, female   DOB: 09/08/1976, 41 y.o.   MRN: [id number]  HPI Patient's seen with slightly tender "knot "left lateral thoracic region which she first noted last night. She was changing clothes and happened to note some swelling in that region. Never noted previously. No fevers or chills. No drainage. Minimal pain.  She has history of hypertension and has been off medication now for over a year. At one point was on angiotensin receptor blocker combined with HCTZ. Denies any recent headaches, dizziness, chest pains. She thinks she may have some increased fluid retention recently. She is taking diclofenac for some knee difficulties  Past Medical History:  Diagnosis Date  . Hypertension    Past Surgical History:  Procedure Laterality Date  . TUBAL LIGATION      reports that she has never smoked. She has never used smokeless tobacco. She reports that she does not drink alcohol or use drugs. family history includes Hypertension in her mother. Allergies  Allergen Reactions  . Ramipril Cough     Review of Systems  Constitutional: Negative for chills, fatigue and fever.  Eyes: Negative for visual disturbance.  Respiratory: Negative for cough, chest tightness, shortness of breath and wheezing.   Cardiovascular: Negative for chest pain, palpitations and leg swelling.  Neurological: Negative for dizziness, seizures, syncope, weakness, light-headedness and headaches.       Objective:   Physical Exam  Constitutional: She appears well-developed and well-nourished.  Eyes: Pupils are equal, round, and reactive to light.  Neck: Neck supple. No thyromegaly present.  Cardiovascular: Normal rate and regular rhythm.  Exam reveals no gallop.   Pulmonary/Chest: Effort normal and breath sounds normal. No respiratory distress. She has no wheezes. She has no rales.  Musculoskeletal: She exhibits no edema.  No pitting edema of the legs  Neurological: She is  alert.  Skin:  Left upper lateral thoracic area she has about a 1 x 1 cm area of erythema on the skin. Underneath this she has slightly mobile, minimally tender, nonfluctuant cystic lesion       Assessment:     #1 probable infected sebaceous cyst left upper back. This is non-fluctuant  #2 hypertension with fairly severe elevated reading today. Initial reading 168/102 and repeat left arm seated after rest 180/110    Plan:     -Hold diclofenac- if possible -Start back HCTZ 25 mg daily -Doxycycline 100 mg twice daily for 10 days -Warm compresses to left upper back several times daily -We explained that she likely has infected sebaceous cyst but this is very indurated. This may become more fluctuant over the next couple days and need incision and drainage -Schedule follow-up with her primary within 1-2 weeks to reassess blood pressure  Kristian CoveyBruce W Jannell Franta MD [facility]

## 2016-11-03 ENCOUNTER — Ambulatory Visit: Payer: 59

## 2016-11-03 DIAGNOSIS — R262 Difficulty in walking, not elsewhere classified: Secondary | ICD-10-CM

## 2016-11-03 DIAGNOSIS — M25561 Pain in right knee: Secondary | ICD-10-CM

## 2016-11-03 DIAGNOSIS — M1712 Unilateral primary osteoarthritis, left knee: Secondary | ICD-10-CM | POA: Diagnosis not present

## 2016-11-03 DIAGNOSIS — G8929 Other chronic pain: Secondary | ICD-10-CM

## 2016-11-03 DIAGNOSIS — M222X2 Patellofemoral disorders, left knee: Secondary | ICD-10-CM | POA: Diagnosis not present

## 2016-11-03 NOTE — Patient Instructions (Signed)
From cabinet issued band SLR standing and knee flexion with band 1-2x/day  10-20 reps . Cautioned to stop if hip becomes sore

## 2016-11-03 NOTE — Therapy (Signed)
[facility] [address] [place], [place], [address] Phone: [phone]   Fax:  [phone]  Physical Therapy Treatment  Patient Details  Name: Jenny HertzKimberly W Patterson MRN: [id number] Date of Birth: 1976-09-10 Referring Provider: Davy Piqueebbeca Bassett, MD  Encounter Date: 11/03/2016      PT End of Session - 11/03/16 1542    Visit Number 4   Number of Visits 12   Date for PT Re-Evaluation 11/28/16   Authorization Type UMR   PT Start Time 0345   PT Stop Time 0430   PT Time Calculation (min) 45 min   Activity Tolerance Patient tolerated treatment well   Behavior During Therapy [facility] for tasks assessed/performed      Past Medical History:  Diagnosis Date  . Hypertension     Past Surgical History:  Procedure Laterality Date  . TUBAL LIGATION      There were no vitals filed for this visit.      Subjective Assessment - 11/03/16 1547    Subjective 20% better. Tape is helpful   Currently in Pain? Yes   Pain Score 3    Pain Location Knee   Pain Orientation Right;Anterior   Pain Descriptors / Indicators Sore   Pain Type Chronic pain   Pain Onset More than a month ago   Pain Frequency Constant   Aggravating Factors  stairs , standing ,sitting flexed   Pain Relieving Factors tape meds                         [facility] Adult PT Treatment/Exercise - 11/03/16 0001      Knee/Hip Exercises: Aerobic   Stationary Bike L1 5 min     Knee/Hip Exercises: Standing   Knee Flexion Strengthening;20 reps;Right   Knee Flexion Limitations blue band seated   Hip Flexion Right;15 reps;Knee straight   Hip Flexion Limitations blue band   Hip ADduction Right;15 reps   Hip ADduction Limitations blue band   Hip Abduction Right;15 reps;Knee straight   Abduction Limitations blue band   Hip Extension Right;15 reps;Knee straight   Extension Limitations blue band   Forward Step Up Right;Hand Hold: 1;20 reps;Step Height: 6"   Other  Standing Knee Exercises with slight bend knee RT step to touch 6 inch step  lightly x 12  2 sets     Manual Therapy   McConnell R knee, Lateral > Medial pull with added superior pull medial and inferior pull laterally                PT Education - 11/03/16 1642    Education provided Yes   Education Details band exercises   Person(s) Educated Patient   Methods Explanation;Tactile cues;Verbal cues;Handout   Comprehension Returned demonstration;Verbalized understanding          PT Short Term Goals - 11/03/16 1643      PT SHORT TERM GOAL #1   Title She will be independent with inital HEP   Status Achieved     PT SHORT TERM GOAL #2   Title she will repor 30% decr pain with normal activity during day   Baseline 20%   Status On-going           PT Long Term Goals - 10/21/16 1756      PT LONG TERM GOAL #1   Title She will be independnet with all HEP issued   Time 6   Period Weeks   Status New  PT LONG TERM GOAL #2   Title She will report pain improved 60% or more with all daily activity   Time 6   Period Weeks   Status New     PT LONG TERM GOAL #3   Title She will report improved balance after standing from prolonged sitting or lying down   Time 6   Period Weeks   Status New     PT LONG TERM GOAL #4   Title She will report 50% improvement with walking stairs at home   Time 6   Period Weeks   Status New               Plan - 11/03/16 1542    Clinical Impression Statement Improving  slowly , tape beneficial . tolerated band exercises without increase pain . Felt good at end.    PT Treatment/Interventions Cryotherapy;Iontophoresis 4mg /ml Dexamethasone;Moist Heat;Ultrasound;Passive range of motion;Patient/family education;Taping;Manual techniques;Therapeutic exercise   PT Next Visit Plan continue taping, and modify as needed, strengthening , modalities as needed, VMO strengthening, manual over vastus lateralis, hip strengthening, gait/stair  training.    PT Home Exercise Plan piriformis , single knee to chest stretch   Consulted and Agree with Plan of Care Patient      Patient will benefit from skilled therapeutic intervention in order to improve the following deficits and impairments:  Decreased strength, Pain, Decreased activity tolerance, Difficulty walking  Visit Diagnosis: Osteoarthritis of left knee, unspecified osteoarthritis type  Patellofemoral pain syndrome of left knee  Chronic pain of right knee  Difficulty walking down stairs  Difficulty walking up stairs     Problem List Patient Active Problem List   Diagnosis Date Noted  . Palpitations 12/13/2015  . Essential hypertension, benign 12/22/2011  . Routine general medical examination at a health care facility 12/22/2011    Caprice Red  PT 11/03/2016, 4:49 PM  [facility] [address] [place], [place], [address] Phone: [phone]   Fax:  [phone]  Name: Jenny Parsons MRN: [id number] Date of Birth: 09-07-1976

## 2016-11-05 ENCOUNTER — Other Ambulatory Visit (INDEPENDENT_AMBULATORY_CARE_PROVIDER_SITE_OTHER): Payer: 59

## 2016-11-05 ENCOUNTER — Ambulatory Visit (INDEPENDENT_AMBULATORY_CARE_PROVIDER_SITE_OTHER): Payer: 59 | Admitting: Internal Medicine

## 2016-11-05 ENCOUNTER — Encounter: Payer: Self-pay | Admitting: Internal Medicine

## 2016-11-05 VITALS — BP 164/118 | HR 87 | Temp 97.8°F | Resp 16 | Ht 60.0 in | Wt 211.0 lb

## 2016-11-05 DIAGNOSIS — I10 Essential (primary) hypertension: Secondary | ICD-10-CM

## 2016-11-05 DIAGNOSIS — R0683 Snoring: Secondary | ICD-10-CM | POA: Insufficient documentation

## 2016-11-05 DIAGNOSIS — F418 Other specified anxiety disorders: Secondary | ICD-10-CM

## 2016-11-05 DIAGNOSIS — R7989 Other specified abnormal findings of blood chemistry: Secondary | ICD-10-CM

## 2016-11-05 DIAGNOSIS — R945 Abnormal results of liver function studies: Secondary | ICD-10-CM

## 2016-11-05 DIAGNOSIS — E876 Hypokalemia: Secondary | ICD-10-CM | POA: Diagnosis not present

## 2016-11-05 LAB — CBC WITH DIFFERENTIAL/PLATELET
BASOS ABS: 0 10*3/uL (ref 0.0–0.1)
Basophils Relative: 0.6 % (ref 0.0–3.0)
Eosinophils Absolute: 0.1 10*3/uL (ref 0.0–0.7)
Eosinophils Relative: 1.5 % (ref 0.0–5.0)
HCT: 39.1 % (ref 36.0–46.0)
Hemoglobin: 13.5 g/dL (ref 12.0–15.0)
Lymphocytes Relative: 29.7 % (ref 12.0–46.0)
Lymphs Abs: 2.4 10*3/uL (ref 0.7–4.0)
MCHC: 34.6 g/dL (ref 30.0–36.0)
MCV: 85.2 fl (ref 78.0–100.0)
MONOS PCT: 6.5 % (ref 3.0–12.0)
Monocytes Absolute: 0.5 10*3/uL (ref 0.1–1.0)
Neutro Abs: 4.9 10*3/uL (ref 1.4–7.7)
Neutrophils Relative %: 61.7 % (ref 43.0–77.0)
Platelets: 222 10*3/uL (ref 150.0–400.0)
RBC: 4.59 Mil/uL (ref 3.87–5.11)
RDW: 13.1 % (ref 11.5–15.5)
WBC: 7.9 10*3/uL (ref 4.0–10.5)

## 2016-11-05 LAB — COMPREHENSIVE METABOLIC PANEL
ALK PHOS: 79 U/L (ref 39–117)
ALT: 43 U/L — AB (ref 0–35)
AST: 32 U/L (ref 0–37)
Albumin: 4.4 g/dL (ref 3.5–5.2)
BILIRUBIN TOTAL: 0.6 mg/dL (ref 0.2–1.2)
BUN: 19 mg/dL (ref 6–23)
CALCIUM: 9.3 mg/dL (ref 8.4–10.5)
CO2: 30 meq/L (ref 19–32)
Chloride: 101 mEq/L (ref 96–112)
Creatinine, Ser: 0.93 mg/dL (ref 0.40–1.20)
GFR: 71.52 mL/min (ref >60.00)
Glucose, Bld: 92 mg/dL (ref 70–99)
Potassium: 3.4 mEq/L — ABNORMAL LOW (ref 3.5–5.1)
Sodium: 135 mEq/L (ref 135–145)
TOTAL PROTEIN: 7.2 g/dL (ref 6.0–8.3)

## 2016-11-05 LAB — URINALYSIS, ROUTINE W REFLEX MICROSCOPIC
BILIRUBIN URINE: NEGATIVE
HGB URINE DIPSTICK: NEGATIVE
Ketones, ur: NEGATIVE
LEUKOCYTES UA: NEGATIVE
NITRITE: NEGATIVE
Specific Gravity, Urine: <=1.005 — AB (ref 1.000–1.030)
TOTAL PROTEIN, URINE-UPE24: NEGATIVE
Urine Glucose: NEGATIVE
Urobilinogen, UA: 0.2 (ref 0.0–1.0)
pH: 6 (ref 5.0–8.0)

## 2016-11-05 MED ORDER — AZILSARTAN-CHLORTHALIDONE 40-25 MG PO TABS: 1.0000 | ORAL_TABLET | Freq: Every day | ORAL | 0 refills | Status: DC

## 2016-11-05 MED ORDER — POTASSIUM CHLORIDE CRYS ER 20 MEQ PO TBCR: 20.0000 meq | EXTENDED_RELEASE_TABLET | Freq: Two times a day (BID) | ORAL | 1 refills | Status: DC

## 2016-11-05 NOTE — Patient Instructions (Addendum)
Hypertension Hypertension, commonly called high blood pressure, is when the force of blood pumping through your arteries is too strong. Your arteries are the blood vessels that carry blood from your heart throughout your body. A blood pressure reading consists of a higher number over a lower number, such as 110/72. The higher number (systolic) is the pressure inside your arteries when your heart pumps. The lower number (diastolic) is the pressure inside your arteries when your heart relaxes. Ideally you want your blood pressure below 120/80. Hypertension forces your heart to work harder to pump blood. Your arteries may become narrow or stiff. Having untreated or uncontrolled hypertension can cause heart attack, stroke, kidney disease, and other problems. What increases the risk? Some risk factors for high blood pressure are controllable. Others are not. Risk factors you cannot control include:  Race. You may be at higher risk if you are African American.  Age. Risk increases with age.  Gender. Men are at higher risk than women before age 45 years. After age 65, women are at higher risk than men. Risk factors you can control include:  Not getting enough exercise or physical activity.  Being overweight.  Getting too much fat, sugar, calories, or salt in your diet.  Drinking too much alcohol. What are the signs or symptoms? Hypertension does not usually cause signs or symptoms. Extremely high blood pressure (hypertensive crisis) may cause headache, anxiety, shortness of breath, and nosebleed. How is this diagnosed? To check if you have hypertension, your health care provider will measure your blood pressure while you are seated, with your arm held at the level of your heart. It should be measured at least twice using the same arm. Certain conditions can cause a difference in blood pressure between your right and left arms. A blood pressure reading that is higher than normal on one occasion does  not mean that you need treatment. If it is not clear whether you have high blood pressure, you may be asked to return on a different day to have your blood pressure checked again. Or, you may be asked to monitor your blood pressure at home for 1 or more weeks. How is this treated? Treating high blood pressure includes making lifestyle changes and possibly taking medicine. Living a healthy lifestyle can help lower high blood pressure. You may need to change some of your habits. Lifestyle changes may include:  Following the DASH diet. This diet is high in fruits, vegetables, and whole grains. It is low in salt, red meat, and added sugars.  Keep your sodium intake below 2,300 mg per day.  Getting at least 30-45 minutes of aerobic exercise at least 4 times per week.  Losing weight if necessary.  Not smoking.  Limiting alcoholic beverages.  Learning ways to reduce stress. Your health care provider may prescribe medicine if lifestyle changes are not enough to get your blood pressure under control, and if one of the following is true:  You are 18-59 years of age and your systolic blood pressure is above 140.  You are 60 years of age or older, and your systolic blood pressure is above 150.  Your diastolic blood pressure is above 90.  You have diabetes, and your systolic blood pressure is over 140 or your diastolic blood pressure is over 90.  You have kidney disease and your blood pressure is above 140/90.  You have heart disease and your blood pressure is above 140/90. Your personal target blood pressure may vary depending on your medical   conditions, your age, and other factors. Follow these instructions at home:  Have your blood pressure rechecked as directed by your health care provider.  Take medicines only as directed by your health care provider. Follow the directions carefully. Blood pressure medicines must be taken as prescribed. The medicine does not work as well when you skip  doses. Skipping doses also puts you at risk for problems.  Do not smoke.  Monitor your blood pressure at home as directed by your health care provider. Contact a health care provider if:  You think you are having a reaction to medicines taken.  You have recurrent headaches or feel dizzy.  You have swelling in your ankles.  You have trouble with your vision. Get help right away if:  You develop a severe headache or confusion.  You have unusual weakness, numbness, or feel faint.  You have severe chest or abdominal pain.  You vomit repeatedly.  You have trouble breathing. This information is not intended to replace advice given to you by your health care provider. Make sure you discuss any questions you have with your health care provider. Document Released: 09/08/2005 Document Revised: 02/14/2016 Document Reviewed: 07/01/2013 Elsevier Interactive Patient Education  2017 Elsevier Inc.  

## 2016-11-05 NOTE — Progress Notes (Signed)
Pre visit review using our clinic review tool, if applicable. No additional management support is needed unless otherwise documented below in the visit note. 

## 2016-11-05 NOTE — Progress Notes (Signed)
Subjective:  Patient ID: Jenny Parsons, female    DOB: 1976-09-09  Age: 41 y.o. MRN: [id number]  CC: Hypertension   HPI Jenny Parsons presents for Concerns about her blood pressure. She was seen approximate 5 days ago and was started on hydrochlorothiazide but complains that her blood pressure is still not well controlled. She complains of chronic snoring. She denies recent headache, blurred vision, chest pain, shortness of breath, palpitations, or edema.  She is under a lot of stress and complains of anhedonia and anxiety.  Outpatient Medications Prior to Visit  Medication Sig Dispense Refill  . doxycycline (VIBRAMYCIN) 100 MG capsule Take 1 capsule (100 mg total) by mouth 2 (two) times daily. 20 capsule 0  . diclofenac (VOLTAREN) 75 MG EC tablet Take 75 mg by mouth 2 (two) times daily.    . hydrochlorothiazide (HYDRODIURIL) 25 MG tablet Take 1 tablet (25 mg total) by mouth daily. 90 tablet 3   No facility-administered medications prior to visit.     ROS Review of Systems  Constitutional: Negative for activity change, appetite change and unexpected weight change.  HENT: Negative.  Negative for trouble swallowing.   Eyes: Negative for visual disturbance.  Respiratory: Negative for cough, chest tightness, shortness of breath and wheezing.   Cardiovascular: Negative for chest pain, palpitations and leg swelling.  Gastrointestinal: Negative.  Negative for abdominal pain, constipation, diarrhea, nausea and vomiting.  Endocrine: Negative.  Negative for cold intolerance, heat intolerance and polyuria.  Genitourinary: Negative.  Negative for decreased urine volume, difficulty urinating, flank pain, frequency and urgency.  Musculoskeletal: Negative.  Negative for back pain, joint swelling and myalgias.  Skin: Negative.  Negative for color change and rash.  Neurological: Negative.  Negative for dizziness, speech difficulty, weakness, numbness and headaches.  Hematological:  Negative for adenopathy. Does not bruise/bleed easily.  Psychiatric/Behavioral: Positive for dysphoric mood. Negative for behavioral problems, confusion, decreased concentration, sleep disturbance and suicidal ideas. The patient is nervous/anxious.     Objective:  BP (!) 164/118 (BP Location: Left Arm, Patient Position: Sitting, Cuff Size: Normal)   Pulse 87   Temp 97.8 F (36.6 C) (Oral)   Resp 16   Ht 5' (1.524 m)   Wt 211 lb (95.7 kg)   SpO2 99%   BMI 41.21 kg/m   BP Readings from Last 3 Encounters:  11/05/16 (!) 164/118  10/31/16 (!) 180/110  06/06/16 126/84    Wt Readings from Last 3 Encounters:  11/05/16 211 lb (95.7 kg)  10/31/16 215 lb 6.4 oz (97.7 kg)  06/06/16 211 lb (95.7 kg)    Physical Exam  Constitutional: She is oriented to person, place, and time. No distress.  HENT:  Mouth/Throat: Oropharynx is clear and moist. No oropharyngeal exudate.  Eyes: Conjunctivae are normal. Right eye exhibits no discharge. Left eye exhibits no discharge. No scleral icterus.  Neck: Normal range of motion. Neck supple. No JVD present. No tracheal deviation present. No thyromegaly present.  Cardiovascular: Normal rate, regular rhythm, normal heart sounds and intact distal pulses.  Exam reveals no gallop and no friction rub.   No murmur heard. EKG - Sinus  Rhythm  -Left axis.   ABNORMAL    Pulmonary/Chest: Effort normal and breath sounds normal. No stridor. No respiratory distress. She has no wheezes. She has no rales. She exhibits no tenderness.  Abdominal: Soft. Bowel sounds are normal. She exhibits no distension and no mass. There is no tenderness. There is no rebound and no guarding.  Musculoskeletal: Normal  range of motion. She exhibits no edema, tenderness or deformity.  Lymphadenopathy:    She has no cervical adenopathy.  Neurological: She is oriented to person, place, and time.  Skin: Skin is warm and dry. No rash noted. She is not diaphoretic. No erythema. No pallor.    Psychiatric: She has a normal mood and affect. Her behavior is normal. Judgment and thought content normal.  Vitals reviewed.   Lab Results  Component Value Date   WBC 7.9 11/05/2016   HGB 13.5 11/05/2016   HCT 39.1 11/05/2016   PLT 222.0 11/05/2016   GLUCOSE 92 11/05/2016   CHOL 184 12/22/2011   TRIG 104.0 12/22/2011   HDL 55.20 12/22/2011   LDLCALC 108 (H) 12/22/2011   ALT 43 (H) 11/05/2016   AST 32 11/05/2016   NA 135 11/05/2016   K 3.4 (L) 11/05/2016   CL 101 11/05/2016   CREATININE 0.93 11/05/2016   BUN 19 11/05/2016   CO2 30 11/05/2016   TSH 1.00 11/05/2016    Ct Maxillofacial Ltd Wo Cm  Result Date: 11/13/2015 CLINICAL DATA:  Right maxillary and frontal sinus pressure for 3 weeks, with drainage and productive cough EXAM: CT PARANASAL SINUS LIMITED WITHOUT CONTRAST TECHNIQUE: Non-contiguous Multidetector CT images of the paranasal sinuses were obtained in a single plane without contrast. COMPARISON:  None. FINDINGS: Extensive inflammatory change right frontal sinus which is largely opacified. Anterior and posterior ethmoid air cells on the right largely opacified. Left frontal sinus clear. Left ethmoid air cells clear. Minimal inflammatory change in both sphenoid sinuses. Moderate mucosal periosteal thickening and opacification right maxillary sinus with fluid level. Small fluid level with mild inflammatory change left maxillary sinus. IMPRESSION: Moderate paranasal sinusitis as described above right more so than left. Fluid level suggests acute sinusitis in the maxillary regions. Electronically Signed   By: Skipper Cliche M.D.   On: 11/13/2015 16:42    Assessment & Plan:   Jenny Parsons was seen today for hypertension.  Diagnoses and all orders for this visit:  Essential hypertension, benign- her EKG is negative for LVH or ischemia, her labs revealed mild hypokalemia but showed no other concerns for end organ damage or secondary causes. I will change her to a more potent  thiazide diuretic with chlorthalidone and will start an ARB. Will also treat the hypokalemia. I'm concerned she may have sleep apnea so will refer for sleep evaluation. Will also do an ultrasound of her kidneys to screen for asymmetry or hydronephrosis. -     EKG 12-Lead -     Comprehensive metabolic panel; Future -     CBC with Differential/Platelet; Future -     Urinalysis, Routine w reflex microscopic; Future -     Thyroid Panel With TSH; Future -     Azilsartan-Chlorthalidone (EDARBYCLOR) 40-25 MG TABS; Take 1 tablet by mouth daily. -     potassium chloride SA (K-DUR,KLOR-CON) 20 MEQ tablet; Take 1 tablet (20 mEq total) by mouth 2 (two) times daily. -     US Abdomen Complete; Future  Snoring -     Ambulatory referral to Pulmonology  Hypokalemia -     potassium chloride SA (K-DUR,KLOR-CON) 20 MEQ tablet; Take 1 tablet (20 mEq total) by mouth 2 (two) times daily.  Elevated LFTs- she has a mild elevation in her ALT, I'm concerned she has fatty liver disease and it therefore ordered an ultrasound of her liver to see if it's consistent with fatty liver disease. In the meantime she will work to improve  her lifestyle modifications. -     US Abdomen Complete; Future  Depression with anxiety -     Vilazodone HCl (VIIBRYD STARTER PACK) 10 & 20 MG KIT; Take 1 tablet by mouth daily.   I have discontinued Jenny Parsons's diclofenac and hydrochlorothiazide. I am also having her start on Azilsartan-Chlorthalidone, potassium chloride SA, and Vilazodone HCl. Additionally, I am having her maintain her doxycycline.  Meds ordered this encounter  Medications  . Azilsartan-Chlorthalidone (EDARBYCLOR) 40-25 MG TABS    Sig: Take 1 tablet by mouth daily.    Dispense:  28 tablet    Refill:  0  . potassium chloride SA (K-DUR,KLOR-CON) 20 MEQ tablet    Sig: Take 1 tablet (20 mEq total) by mouth 2 (two) times daily.    Dispense:  180 tablet    Refill:  1  . Vilazodone HCl (VIIBRYD STARTER PACK) 10 & 20 MG  KIT    Sig: Take 1 tablet by mouth daily.    Dispense:  1 kit    Refill:  0     Follow-up: Return in about 3 weeks (around 11/26/2016).  Scarlette Calico, MD

## 2016-11-06 ENCOUNTER — Ambulatory Visit: Payer: 59

## 2016-11-06 ENCOUNTER — Encounter: Payer: Self-pay | Admitting: Internal Medicine

## 2016-11-06 DIAGNOSIS — F418 Other specified anxiety disorders: Secondary | ICD-10-CM | POA: Insufficient documentation

## 2016-11-06 DIAGNOSIS — R7989 Other specified abnormal findings of blood chemistry: Secondary | ICD-10-CM | POA: Insufficient documentation

## 2016-11-06 DIAGNOSIS — R945 Abnormal results of liver function studies: Secondary | ICD-10-CM

## 2016-11-06 LAB — THYROID PANEL WITH TSH
FREE THYROXINE INDEX: 3.2 (ref 1.4–3.8)
T3 Uptake: 26 % (ref 22–35)
T4, Total: 12.3 ug/dL — ABNORMAL HIGH (ref 4.5–12.0)
TSH: 1 mIU/L

## 2016-11-06 MED ORDER — VILAZODONE HCL 10 & 20 MG PO KIT: 1.0000 | PACK | Freq: Every day | ORAL | 0 refills | Status: DC

## 2016-11-10 ENCOUNTER — Ambulatory Visit: Payer: 59

## 2016-11-10 DIAGNOSIS — R262 Difficulty in walking, not elsewhere classified: Secondary | ICD-10-CM

## 2016-11-10 DIAGNOSIS — M25561 Pain in right knee: Secondary | ICD-10-CM | POA: Diagnosis not present

## 2016-11-10 DIAGNOSIS — G8929 Other chronic pain: Secondary | ICD-10-CM | POA: Diagnosis not present

## 2016-11-10 DIAGNOSIS — M222X2 Patellofemoral disorders, left knee: Secondary | ICD-10-CM

## 2016-11-10 DIAGNOSIS — M1712 Unilateral primary osteoarthritis, left knee: Secondary | ICD-10-CM

## 2016-11-10 NOTE — Therapy (Signed)
[facility] [address] [place], [place], [address] Phone: [phone]   Fax:  [phone]  Physical Therapy Treatment  Patient Details  Name: MERIEM LEMIEUX MRN: [id number] Date of Birth: 1975/12/09 Referring Provider: Davy Pique, MD  Encounter Date: 11/10/2016      PT End of Session - 11/10/16 1553    Visit Number 5   Number of Visits 12   Date for PT Re-Evaluation 11/28/16   Authorization Type UMR   PT Start Time 0353   PT Stop Time 0435   PT Time Calculation (min) 42 min   Activity Tolerance Patient tolerated treatment well   Behavior During Therapy [facility] for tasks assessed/performed      Past Medical History:  Diagnosis Date  . Hypertension     Past Surgical History:  Procedure Laterality Date  . TUBAL LIGATION      There were no vitals filed for this visit.      Subjective Assessment - 11/10/16 1555    Subjective 3/10 today. Removed tape Thursday PM. Feels popping with flexion without pain.    Currently in Pain? Yes   Pain Score 3    Pain Location Knee   Pain Orientation Right;Anterior   Pain Descriptors / Indicators Sore   Pain Type Chronic pain   Pain Onset More than a month ago   Pain Frequency Constant   Aggravating Factors  stairs / prolonged standing sit flexed   Pain Relieving Factors tape , meds   Multiple Pain Sites No                         [facility] Adult PT Treatment/Exercise - 11/10/16 0001      Knee/Hip Exercises: Aerobic   Stationary Bike L2 6 min     Knee/Hip Exercises: Supine   Short Arc Quad Sets Strengthening;Right   Short Arc Quad Sets Limitations 3 pounds x 30 reps   Straight Leg Raise with External Rotation Right;3 sets;10 reps     Knee/Hip Exercises: Sidelying   Hip ABduction Right;2 sets;10 reps   Hip ABduction Limitations 3 pounds   Hip ADduction Right;2 sets;10 reps   Hip ADduction Limitations 3 pounds     Manual Therapy   McConnell R  knee, Lateral > Medial pull with added superior pull medial and inferior pull laterally                PT Education - 11/10/16 1639    Education provided Yes   Education Details HEP LE strength   Person(s) Educated Patient   Methods Explanation;Verbal cues;Handout;Tactile cues   Comprehension Verbalized understanding;Returned demonstration          PT Short Term Goals - 11/03/16 1643      PT SHORT TERM GOAL #1   Title She will be independent with inital HEP   Status Achieved     PT SHORT TERM GOAL #2   Title she will repor 30% decr pain with normal activity during day   Baseline 20%   Status On-going           PT Long Term Goals - 11/10/16 1646      PT LONG TERM GOAL #1   Title She will be independent with all HEP issued   Time 6   Period Weeks   Status On-going     PT LONG TERM GOAL #2   Title She will report pain improved 60% or more with all daily  activity   Status On-going     PT LONG TERM GOAL #3   Title She will report improved balance after standing from prolonged sitting or lying down   Status On-going     PT LONG TERM GOAL #4   Title She will report 50% improvement with walking stairs at home   Status On-going               Plan - 11/10/16 1557    Clinical Impression Statement No improvement over last week. she feels popping in knee without pain. She was able to do all workout today without increased pain and retaped end of session. Will continue with weight as tolerated   PT Treatment/Interventions Cryotherapy;Iontophoresis 4mg /ml Dexamethasone;Moist Heat;Ultrasound;Passive range of motion;Patient/family education;Taping;Manual techniques;Therapeutic exercise   PT Next Visit Plan continue taping or do trial of ionto, and modify as needed, strengthening , modalities as needed, VMO strengthening, manual over vastus lateralis, hip strengthening, gait/stair training.    PT Home Exercise Plan piriformis , single knee to chest stretch, SLR 4  way , wall sit, SAQ   Consulted and Agree with Plan of Care Patient      Patient will benefit from skilled therapeutic intervention in order to improve the following deficits and impairments:  Decreased strength, Pain, Decreased activity tolerance, Difficulty walking  Visit Diagnosis: Osteoarthritis of left knee, unspecified osteoarthritis type  Patellofemoral pain syndrome of left knee  Chronic pain of right knee  Difficulty walking up stairs  Difficulty walking down stairs     Problem List Patient Active Problem List   Diagnosis Date Noted  . Elevated LFTs 11/06/2016  . Depression with anxiety 11/06/2016  . Snoring 11/05/2016  . Hypokalemia 11/05/2016  . Palpitations 12/13/2015  . Essential hypertension, benign 12/22/2011  . Routine general medical examination at a health care facility 12/22/2011    Caprice RedChasse, Aishwarya Shiplett M  PT 11/10/2016, 4:49 PM  [facility] [address] [place], [place], [address] Phone: [phone]   Fax:  [phone]  Name: Fransisco HertzKimberly W Patterson MRN: [id number] Date of Birth: April 01, 1976

## 2016-11-10 NOTE — Patient Instructions (Signed)
Wall Sit    Back against wall, slide down so knees are at 90 angle. Hold ____ seconds. Do ____ sets. Complete ____ repetitions.  http://st.exer.us/295   Copyright  VHI. All rights reserved.  Straight leg lift with 30 degrees ER x15 reps 1-2x/dayKNEE: Extension, Short Arc Quads (Weight)    Place weight around leg. Lie on back with bolster under knees. Straighten knee. Hold __5-10_ seconds. Use _1-5__ lb weight. ___25-30 reps per set, __1-2_ sets per day, _5ADDUCTION: Side-Lying (Active)    Lie on right side, with top leg bent and in front of other leg. Lift straight leg up as high as possible. Use 1-5 ___ lbs. Complete _1-2__ sets of _25-30__ repetitions. Perform _1Buttocks    Lying face down, inhale. Keeping leg straight, exhale while lifting leg just off floor. Hold 1 count. Slowly return to starting position. Repeat _20-30___ times each leg. Do __1-2__ sets per session. Do _1___ sessions per day. Do with ____ pound ankle weights.  Copyright  VHI. All rights reserved.  (Home) Extension: Caudal Unilateral Hip - Prone  choice option to do hip extensionHip Abductors (Side Lying)    Lie on side with bottom leg bent at knee, ___1-5_ pound weight on right leg. Lift up and back _12-18___ inches in air. Keep upper hip rolled forward and knee straight. Hold ___1-2_ seconds. Repeat __10-30__ times. Do ___1_ sessions per day. CAUTION: Move slowly.  Copyright  VHI. All rights reserved.    Lean across table on elbows. Lift one leg. Do not lift it higher than trunk. Repeat ____ times per set. Do ____ sets per session. Do ____ sessions per week.  Copyright  VHI. All rights reserved.  __ sessions per day.  http://gtsc.exer.us/129   Copyright  VHI. All rights reserved.  __ days per week   Copyright  VHI. All rights reserved.

## 2016-11-12 DIAGNOSIS — M25561 Pain in right knee: Secondary | ICD-10-CM | POA: Diagnosis not present

## 2016-11-13 ENCOUNTER — Ambulatory Visit: Payer: 59

## 2016-11-19 ENCOUNTER — Other Ambulatory Visit: Payer: Self-pay | Admitting: Internal Medicine

## 2016-11-19 ENCOUNTER — Encounter: Payer: Self-pay | Admitting: Internal Medicine

## 2016-11-19 DIAGNOSIS — L720 Epidermal cyst: Secondary | ICD-10-CM | POA: Insufficient documentation

## 2016-11-24 ENCOUNTER — Encounter: Payer: Self-pay | Admitting: Internal Medicine

## 2016-11-25 ENCOUNTER — Ambulatory Visit
Admission: RE | Admit: 2016-11-25 | Discharge: 2016-11-25 | Disposition: A | Discharge status: Home / Self Care | Payer: 59 | Source: Ambulatory Visit | Attending: Internal Medicine | Admitting: Internal Medicine

## 2016-11-25 ENCOUNTER — Encounter: Payer: Self-pay | Admitting: Internal Medicine

## 2016-11-25 DIAGNOSIS — I10 Essential (primary) hypertension: Secondary | ICD-10-CM

## 2016-11-25 DIAGNOSIS — R945 Abnormal results of liver function studies: Secondary | ICD-10-CM

## 2016-11-25 DIAGNOSIS — R7989 Other specified abnormal findings of blood chemistry: Secondary | ICD-10-CM

## 2016-11-26 ENCOUNTER — Ambulatory Visit: Payer: 59 | Admitting: Internal Medicine

## 2016-11-26 ENCOUNTER — Other Ambulatory Visit: Payer: Self-pay | Admitting: Internal Medicine

## 2016-11-26 DIAGNOSIS — F418 Other specified anxiety disorders: Secondary | ICD-10-CM

## 2016-11-26 DIAGNOSIS — I10 Essential (primary) hypertension: Secondary | ICD-10-CM

## 2016-11-28 NOTE — Telephone Encounter (Signed)
Pt called in and needs refill today.  She Is completely out and does not want to go though the weekend

## 2016-12-01 ENCOUNTER — Other Ambulatory Visit: Payer: Self-pay | Admitting: Internal Medicine

## 2016-12-01 DIAGNOSIS — F418 Other specified anxiety disorders: Secondary | ICD-10-CM

## 2016-12-01 DIAGNOSIS — I10 Essential (primary) hypertension: Secondary | ICD-10-CM

## 2016-12-01 MED ORDER — VILAZODONE HCL 40 MG PO TABS: 40.0000 mg | ORAL_TABLET | Freq: Every day | ORAL | 5 refills | Status: DC

## 2016-12-01 MED ORDER — AZILSARTAN-CHLORTHALIDONE 40-25 MG PO TABS: 1.0000 | ORAL_TABLET | Freq: Every day | ORAL | 3 refills | Status: DC

## 2016-12-02 ENCOUNTER — Telehealth: Payer: Self-pay | Admitting: *Deleted

## 2016-12-02 DIAGNOSIS — I10 Essential (primary) hypertension: Secondary | ICD-10-CM

## 2016-12-02 MED ORDER — AZILSARTAN-CHLORTHALIDONE 40-25 MG PO TABS: 1.0000 | ORAL_TABLET | Freq: Every day | ORAL | 3 refills | Status: DC

## 2016-12-02 NOTE — Telephone Encounter (Signed)
Rec'd call pt states MD gave her samples on her Edarbyclor requesting [facility]. Sent electronically,,,.lmb

## 2016-12-03 ENCOUNTER — Ambulatory Visit (INDEPENDENT_AMBULATORY_CARE_PROVIDER_SITE_OTHER): Payer: 59 | Admitting: Internal Medicine

## 2016-12-03 ENCOUNTER — Encounter: Payer: Self-pay | Admitting: Internal Medicine

## 2016-12-03 ENCOUNTER — Other Ambulatory Visit (INDEPENDENT_AMBULATORY_CARE_PROVIDER_SITE_OTHER): Payer: 59

## 2016-12-03 ENCOUNTER — Telehealth: Payer: Self-pay

## 2016-12-03 VITALS — BP 110/78 | HR 83 | Temp 98.0°F | Wt 212.0 lb

## 2016-12-03 DIAGNOSIS — I10 Essential (primary) hypertension: Secondary | ICD-10-CM | POA: Diagnosis not present

## 2016-12-03 DIAGNOSIS — F418 Other specified anxiety disorders: Secondary | ICD-10-CM | POA: Diagnosis not present

## 2016-12-03 DIAGNOSIS — R7989 Other specified abnormal findings of blood chemistry: Secondary | ICD-10-CM

## 2016-12-03 DIAGNOSIS — E876 Hypokalemia: Secondary | ICD-10-CM

## 2016-12-03 DIAGNOSIS — R945 Abnormal results of liver function studies: Principal | ICD-10-CM

## 2016-12-03 LAB — COMPREHENSIVE METABOLIC PANEL
ALT: 14 U/L (ref 0–35)
AST: 13 U/L (ref 0–37)
Albumin: 4.2 g/dL (ref 3.5–5.2)
Alkaline Phosphatase: 80 U/L (ref 39–117)
BILIRUBIN TOTAL: 0.3 mg/dL (ref 0.2–1.2)
BUN: 18 mg/dL (ref 6–23)
CHLORIDE: 101 meq/L (ref 96–112)
CO2: 29 meq/L (ref 19–32)
Calcium: 9.8 mg/dL (ref 8.4–10.5)
Creatinine, Ser: 0.78 mg/dL (ref 0.40–1.20)
GFR: 87.58 mL/min (ref >60.00)
Glucose, Bld: 86 mg/dL (ref 70–99)
Potassium: 3.9 mEq/L (ref 3.5–5.1)
Sodium: 136 mEq/L (ref 135–145)
Total Protein: 7.1 g/dL (ref 6.0–8.3)

## 2016-12-03 LAB — MAGNESIUM: Magnesium: 2 mg/dL (ref 1.5–2.5)

## 2016-12-03 MED ORDER — CHLORTHALIDONE 25 MG PO TABS: 12.5000 mg | ORAL_TABLET | Freq: Every day | ORAL | 1 refills | Status: DC

## 2016-12-03 MED ORDER — VILAZODONE HCL 20 MG PO TABS: 1.0000 | ORAL_TABLET | Freq: Every day | ORAL | 3 refills | Status: DC

## 2016-12-03 MED ORDER — LOSARTAN POTASSIUM 100 MG PO TABS: 100.0000 mg | ORAL_TABLET | Freq: Every day | ORAL | 1 refills | Status: DC

## 2016-12-03 NOTE — Progress Notes (Signed)
Pre visit review using our clinic review tool, if applicable. No additional management support is needed unless otherwise documented below in the visit note. 

## 2016-12-03 NOTE — Patient Instructions (Signed)

## 2016-12-03 NOTE — Progress Notes (Signed)
Subjective:  Patient ID: Jenny Parsons, female    DOB: 1976/03/17  Age: 41 y.o. MRN: [id number]  CC: Hypertension   HPI Jenny Parsons presents for follow-up on hypertension and depression. She is having a good response to Viibryd but unfortunately her insurance company will not pay for it. She also tells me her blood pressure has been very well controlled. She said no recent episodes of lightheadedness, dizziness, chest pain, shortness of breath, palpitations, edema, or fatigue. Her most recent labs showed a very slight increase in the AST. An ultrasound of her liver was normal.   Outpatient Medications Prior to Visit  Medication Sig Dispense Refill  . potassium chloride SA (K-DUR,KLOR-CON) 20 MEQ tablet Take 1 tablet (20 mEq total) by mouth 2 (two) times daily. 180 tablet 1  . Azilsartan-Chlorthalidone (EDARBYCLOR) 40-25 MG TABS Take 1 tablet by mouth daily. 30 tablet 3  . Vilazodone HCl (VIIBRYD) 40 MG TABS Take 1 tablet (40 mg total) by mouth daily. 30 tablet 5  . doxycycline (VIBRAMYCIN) 100 MG capsule Take 1 capsule (100 mg total) by mouth 2 (two) times daily. (Patient not taking: Reported on 12/03/2016) 20 capsule 0   No facility-administered medications prior to visit.     ROS Review of Systems  Constitutional: Negative for appetite change, chills, diaphoresis, fatigue, fever and unexpected weight change.  HENT: Negative.   Eyes: Negative for visual disturbance.  Respiratory: Negative for cough, chest tightness, shortness of breath, wheezing and stridor.   Cardiovascular: Negative for chest pain, palpitations and leg swelling.  Gastrointestinal: Negative for abdominal pain, constipation, diarrhea, nausea and vomiting.  Endocrine: Negative for cold intolerance, heat intolerance, polydipsia, polyphagia and polyuria.  Genitourinary: Negative.  Negative for difficulty urinating.  Musculoskeletal: Negative.   Skin: Negative.   Allergic/Immunologic: Negative.     Neurological: Negative.  Negative for dizziness, weakness, numbness and headaches.  Hematological: Negative.  Negative for adenopathy. Does not bruise/bleed easily.  Psychiatric/Behavioral: Positive for dysphoric mood. Negative for agitation, behavioral problems, confusion, decreased concentration, hallucinations, self-injury, sleep disturbance and suicidal ideas. The patient is nervous/anxious. The patient is not hyperactive.     Objective:  BP 110/78 (BP Location: Left Arm, Patient Position: Sitting)   Pulse 83   Temp 98 F (36.7 C) (Oral)   Wt 212 lb (96.2 kg)   SpO2 98%   BMI 41.40 kg/m   BP Readings from Last 3 Encounters:  12/03/16 110/78  11/05/16 (!) 164/118  10/31/16 (!) 180/110    Wt Readings from Last 3 Encounters:  12/03/16 212 lb (96.2 kg)  11/05/16 211 lb (95.7 kg)  10/31/16 215 lb 6.4 oz (97.7 kg)    Physical Exam  Constitutional: She is oriented to person, place, and time. No distress.  HENT:  Mouth/Throat: Oropharynx is clear and moist. No oropharyngeal exudate.  Eyes: Conjunctivae are normal. Right eye exhibits no discharge. Left eye exhibits no discharge. No scleral icterus.  Neck: Normal range of motion. Neck supple. No JVD present. No tracheal deviation present. No thyromegaly present.  Cardiovascular: Normal rate, regular rhythm, normal heart sounds and intact distal pulses.  Exam reveals no gallop and no friction rub.   No murmur heard. Pulmonary/Chest: Effort normal and breath sounds normal. No stridor. No respiratory distress. She has no wheezes. She has no rales. She exhibits no tenderness.  Abdominal: Soft. Bowel sounds are normal. She exhibits no distension and no mass. There is no tenderness. There is no rebound and no guarding.  Musculoskeletal: Normal range of  motion. She exhibits no edema, tenderness or deformity.  Lymphadenopathy:    She has no cervical adenopathy.  Neurological: She is oriented to person, place, and time.  Skin: Skin is  warm and dry. No rash noted. She is not diaphoretic. No erythema. No pallor.  Psychiatric: She has a normal mood and affect. Her behavior is normal. Judgment and thought content normal.  Vitals reviewed.   Lab Results  Component Value Date   WBC 7.9 11/05/2016   HGB 13.5 11/05/2016   HCT 39.1 11/05/2016   PLT 222.0 11/05/2016   GLUCOSE 92 11/05/2016   CHOL 184 12/22/2011   TRIG 104.0 12/22/2011   HDL 55.20 12/22/2011   LDLCALC 108 (H) 12/22/2011   ALT 43 (H) 11/05/2016   AST 32 11/05/2016   NA 135 11/05/2016   K 3.4 (L) 11/05/2016   CL 101 11/05/2016   CREATININE 0.93 11/05/2016   BUN 19 11/05/2016   CO2 30 11/05/2016   TSH 1.00 11/05/2016    US Abdomen Complete  Result Date: 11/25/2016 CLINICAL DATA:  Elevated liver function tests EXAM: ABDOMEN ULTRASOUND COMPLETE COMPARISON:  None. FINDINGS: Gallbladder: No gallstones or wall thickening visualized. No sonographic Murphy sign noted by sonographer. Antegrade flow in the main portal vein. Common bile duct: Diameter: 3 mm Liver: No focal lesion identified. Within normal limits in parenchymal echogenicity. IVC: Limited visualization. Pancreas: Limited visualization. Spleen: Size and appearance within normal limits. Right Kidney: Length: 11 cm. Echogenicity within normal limits. No mass or hydronephrosis visualized. Left Kidney: Length: 11 cm. Echogenicity within normal limits. No mass or hydronephrosis visualized. Abdominal aorta: No aneurysm visualized. Marland Kitchen IMPRESSION: 1. No explanation for the history. 2. Limited visualization of the pancreas. Electronically Signed   By: Marnee Spring M.D.   On: 11/25/2016 09:26    Assessment & Plan:   Breella was seen today for hypertension.  Diagnoses and all orders for this visit:  Elevated LFTs- will recheck her LFTs to see if this has persisted. -     Comprehensive metabolic panel; Future  Essential hypertension, benign- her blood pressure is over controlled and her insurance will not pay  for Edarbyclor so will change to losartan and cut the thiazide diuretic dose by half -     Comprehensive metabolic panel; Future -     chlorthalidone (HYGROTON) 25 MG tablet; Take 0.5 tablets (12.5 mg total) by mouth daily. -     losartan (COZAAR) 100 MG tablet; Take 1 tablet (100 mg total) by mouth daily.  Hypokalemia - I will recheck a potassium level and will treat if indicated -     Comprehensive metabolic panel; Future -     Magnesium; Future  Depression with anxiety- her insurance will not pay for Viibryd so will switch to generic Lexapro -     Vilazodone HCl (VIIBRYD) 20 MG TABS; Take 1 tablet (20 mg total) by mouth daily.   I have discontinued Ms. Parsons's doxycycline, Vilazodone HCl, and Azilsartan-Chlorthalidone. I am also having her start on chlorthalidone, losartan, and Vilazodone HCl. Additionally, I am having her maintain her potassium chloride SA.  Meds ordered this encounter  Medications  . chlorthalidone (HYGROTON) 25 MG tablet    Sig: Take 0.5 tablets (12.5 mg total) by mouth daily.    Dispense:  45 tablet    Refill:  1  . losartan (COZAAR) 100 MG tablet    Sig: Take 1 tablet (100 mg total) by mouth daily.    Dispense:  90 tablet  Refill:  1  . Vilazodone HCl (VIIBRYD) 20 MG TABS    Sig: Take 1 tablet (20 mg total) by mouth daily.    Dispense:  90 tablet    Refill:  3     Follow-up: Return in about 4 months (around 04/04/2017).  Sanda Linger, MD

## 2016-12-04 ENCOUNTER — Telehealth: Payer: Self-pay | Admitting: *Deleted

## 2016-12-04 ENCOUNTER — Other Ambulatory Visit: Payer: Self-pay | Admitting: Internal Medicine

## 2016-12-04 DIAGNOSIS — F418 Other specified anxiety disorders: Secondary | ICD-10-CM

## 2016-12-04 MED ORDER — ESCITALOPRAM OXALATE 10 MG PO TABS: 10.0000 mg | ORAL_TABLET | Freq: Every day | ORAL | 1 refills | Status: DC

## 2016-12-04 NOTE — Telephone Encounter (Signed)
I gave her samples of Viibryd yesterday Ask her if she wants to continue with the samples or change to one of the preferred antidepressants

## 2016-12-04 NOTE — Telephone Encounter (Signed)
Notified pt w/MD response. She states since insurance will not cover MD can go ahead and rx what he feels is better for her from the list, also want to know the samples he gave her can she bring them back because they have not been open.Marland Kitchen.Raechel Chute/lmb

## 2016-12-04 NOTE — Telephone Encounter (Signed)
Rec'd fax stating Jenny PicketViibryd is not cover by pt insurance plan. Pt must have tried and fail (2) of the followings Paroxetine, fluoxetine, Citalopram, Sertraline, Escitalopram, Mirtazapine, Bupropion, Venlafaxine...Jenny Parsons/lmb

## 2016-12-04 NOTE — Telephone Encounter (Signed)
Notified pt w/MD response.../lmb 

## 2016-12-04 NOTE — Telephone Encounter (Signed)
Changed to generic lexapro Yes, bring back the viibryd

## 2016-12-08 DIAGNOSIS — Z01419 Encounter for gynecological examination (general) (routine) without abnormal findings: Secondary | ICD-10-CM | POA: Diagnosis not present

## 2016-12-08 DIAGNOSIS — Z6839 Body mass index (BMI) 39.0-39.9, adult: Secondary | ICD-10-CM | POA: Diagnosis not present

## 2016-12-11 NOTE — Telephone Encounter (Signed)
error 

## 2017-01-14 ENCOUNTER — Institutional Professional Consult (permissible substitution): Payer: 59 | Admitting: Internal Medicine

## 2017-01-23 DIAGNOSIS — H5213 Myopia, bilateral: Secondary | ICD-10-CM | POA: Diagnosis not present

## 2017-01-23 DIAGNOSIS — H52223 Regular astigmatism, bilateral: Secondary | ICD-10-CM | POA: Diagnosis not present

## 2017-02-02 ENCOUNTER — Ambulatory Visit (INDEPENDENT_AMBULATORY_CARE_PROVIDER_SITE_OTHER): Payer: 59 | Admitting: Internal Medicine

## 2017-02-02 ENCOUNTER — Encounter: Payer: Self-pay | Admitting: Internal Medicine

## 2017-02-02 DIAGNOSIS — J309 Allergic rhinitis, unspecified: Secondary | ICD-10-CM | POA: Insufficient documentation

## 2017-02-02 DIAGNOSIS — J301 Allergic rhinitis due to pollen: Secondary | ICD-10-CM | POA: Diagnosis not present

## 2017-02-02 MED ORDER — FLUTICASONE PROPIONATE 50 MCG/ACT NA SUSP: 2.0000 | Freq: Every day | NASAL | 6 refills | Status: DC

## 2017-02-02 NOTE — Patient Instructions (Signed)
Keep taking the allegra for the sinuses. We have sent in the flonase to help as well. This is a nose spray that you use 2 sprays in each nostril once a day for the next 1-2 weeks.   It is okay to use benadryl and sudafed or affrin short term (<5 days) to help as well with night time symptoms.

## 2017-02-02 NOTE — Progress Notes (Signed)
   Subjective:    Patient ID: Jenny Parsons, female    DOB: 11-01-1975, 41 y.o.   MRN: [id number]  HPI The patient is a 41 YO female coming in for congestion and cough going on for about 5 days now. She has tried Careers adviserallegra and mucinex over the counter which have not helped much. Denies fevers or chills. Denies SOB or change in activity. She is coughing mild during the day. Some congestion which affects her breathing overnight. She is having mild worsening to stable symptoms. Her daughter was sick last week and feeling better now.   Review of Systems  Constitutional: Negative for activity change, appetite change, chills, fatigue, fever and unexpected weight change.  HENT: Positive for congestion, postnasal drip, rhinorrhea and sinus pressure. Negative for ear discharge, ear pain, sore throat, trouble swallowing and voice change.   Eyes: Positive for discharge and itching.  Respiratory: Negative.   Cardiovascular: Negative.   Gastrointestinal: Negative.   Musculoskeletal: Negative.   Neurological: Negative.       Objective:   Physical Exam  Constitutional: She is oriented to person, place, and time. She appears well-developed and well-nourished.  HENT:  Head: Normocephalic and atraumatic.  Right Ear: External ear normal.  Left Ear: External ear normal.  Oropharynx with redness and clear drainage.   Eyes: EOM are normal.  Neck: Normal range of motion.  Cardiovascular: Normal rate and regular rhythm.   Pulmonary/Chest: Effort normal and breath sounds normal. No respiratory distress. She has no wheezes. She has no rales.  Abdominal: Soft. She exhibits no distension. There is no tenderness. There is no rebound.  Neurological: She is alert and oriented to person, place, and time.  Skin: Skin is warm and dry.   Vitals:   02/02/17 0837  BP: 140/90  Pulse: 68  Resp: 12  Temp: 98.1 F (36.7 C)  TempSrc: Oral  SpO2: 99%  Weight: 210 lb (95.3 kg)  Height: 5' (1.524 m)        Assessment & Plan:

## 2017-02-02 NOTE — Assessment & Plan Note (Signed)
Rx for flonase to take with her allegra for likely allergic symptoms. No indication for antibiotics or steroids and discussed this with her during visit.

## 2017-02-05 ENCOUNTER — Telehealth: Payer: Self-pay | Admitting: Internal Medicine

## 2017-02-05 NOTE — Telephone Encounter (Signed)
Pt states her congestion is moving to her chest and does not believe the Flonase is working. Would like to try something else.  Jenny Parsons long outpatient

## 2017-02-05 NOTE — Telephone Encounter (Signed)
patient contacted and stated awareness 

## 2017-02-05 NOTE — Telephone Encounter (Signed)
Please advise 

## 2017-02-05 NOTE — Telephone Encounter (Signed)
She can change allegra to zyrtec which is otc or okay to try afrin over the counter.

## 2017-02-10 NOTE — Telephone Encounter (Signed)
Pt called and said that she is still not feeling much better. She did not know what else she could do. She said that she is going on vacation soon and wants this to be gone before she leaves. Please advise.

## 2017-02-10 NOTE — Telephone Encounter (Signed)
The allergens are still out so likely symptoms will not be gone. If she feels no improvement recommend repeat visit with pcp.

## 2017-02-10 NOTE — Telephone Encounter (Signed)
Patient contacted and stated awareness 

## 2017-03-09 ENCOUNTER — Institutional Professional Consult (permissible substitution): Payer: 59 | Admitting: Pulmonary Disease

## 2017-03-26 ENCOUNTER — Ambulatory Visit (INDEPENDENT_AMBULATORY_CARE_PROVIDER_SITE_OTHER): Payer: 59 | Admitting: Internal Medicine

## 2017-03-26 ENCOUNTER — Encounter: Payer: Self-pay | Admitting: Internal Medicine

## 2017-03-26 VITALS — BP 124/84 | HR 60 | Temp 97.7°F | Resp 16 | Ht 60.0 in | Wt 219.0 lb

## 2017-03-26 DIAGNOSIS — J301 Allergic rhinitis due to pollen: Secondary | ICD-10-CM | POA: Diagnosis not present

## 2017-03-26 DIAGNOSIS — J0100 Acute maxillary sinusitis, unspecified: Secondary | ICD-10-CM

## 2017-03-26 MED ORDER — METHYLPREDNISOLONE 4 MG PO TBPK: ORAL_TABLET | ORAL | 0 refills | Status: AC

## 2017-03-26 MED ORDER — AMOXICILLIN-POT CLAVULANATE 875-125 MG PO TABS: 1.0000 | ORAL_TABLET | Freq: Two times a day (BID) | ORAL | 1 refills | Status: DC

## 2017-03-26 MED ORDER — HYDROCODONE-HOMATROPINE 5-1.5 MG/5ML PO SYRP: 5.0000 mL | ORAL_SOLUTION | Freq: Three times a day (TID) | ORAL | 0 refills | Status: DC | PRN

## 2017-03-26 NOTE — Progress Notes (Signed)
Subjective:  Patient ID: Jenny Parsons, female    DOB: [birth year]  Age: 41 y.o. MRN: [id number]  CC: Sinusitis and Allergic Rhinitis    HPI Jenny Parsons presents for concerns about a 3 day history of left facial pain with thick, yellow, green and slightly bloody phlegm from the left side of her nose. She's got minimal symptom relief with Flonase, Mucinex, and Zyrtec. She also complains of nasal congestion, runny nose, and postnasal strep with nonproductive cough. She denies headache, sore throat, fever, chills, swelling, or earaches.  Outpatient Medications Prior to Visit  Medication Sig Dispense Refill  . chlorthalidone (HYGROTON) 25 MG tablet Take 0.5 tablets (12.5 mg total) by mouth daily. 45 tablet 1  . escitalopram (LEXAPRO) 10 MG tablet Take 1 tablet (10 mg total) by mouth daily. 90 tablet 1  . fluticasone (FLONASE) 50 MCG/ACT nasal spray Place 2 sprays into both nostrils daily. 16 g 6  . losartan (COZAAR) 100 MG tablet Take 1 tablet (100 mg total) by mouth daily. 90 tablet 1  . potassium chloride SA (K-DUR,KLOR-CON) 20 MEQ tablet Take 1 tablet (20 mEq total) by mouth 2 (two) times daily. 180 tablet 1   No facility-administered medications prior to visit.     ROS Review of Systems  Constitutional: Negative.  Negative for chills, diaphoresis, fatigue and fever.  HENT: Positive for congestion, nosebleeds, postnasal drip, rhinorrhea, sinus pain and sinus pressure. Negative for dental problem, ear pain, facial swelling, sneezing, sore throat, tinnitus, trouble swallowing and voice change.   Eyes: Negative.   Respiratory: Positive for cough. Negative for chest tightness, shortness of breath and wheezing.   Cardiovascular: Negative for chest pain, palpitations and leg swelling.  Gastrointestinal: Negative for abdominal pain, blood in stool, constipation, diarrhea, nausea and vomiting.  Endocrine: Negative.   Genitourinary: Negative.   Musculoskeletal: Negative.   Skin:  Negative.   Allergic/Immunologic: Negative.   Neurological: Negative.   Hematological: Negative.  Negative for adenopathy.  Psychiatric/Behavioral: Negative.     Objective:  BP 124/84 (BP Location: Left Arm, Patient Position: Sitting, Cuff Size: Normal)   Pulse 60   Temp 97.7 F (36.5 C) (Oral)   Resp 16   Ht 5' (1.524 m)   Wt 219 lb (99.3 kg)   SpO2 99%   BMI 42.77 kg/m   BP Readings from Last 3 Encounters:  03/26/17 124/84  02/02/17 140/90  12/03/16 110/78    Wt Readings from Last 3 Encounters:  03/26/17 219 lb (99.3 kg)  02/02/17 210 lb (95.3 kg)  12/03/16 212 lb (96.2 kg)    Physical Exam  Constitutional: She is oriented to person, place, and time. No distress.  HENT:  Right Ear: Hearing, tympanic membrane, external ear and ear canal normal.  Left Ear: Hearing, tympanic membrane, external ear and ear canal normal.  Nose: Mucosal edema present. No rhinorrhea, sinus tenderness or nasal septal hematoma. No epistaxis. Right sinus exhibits no maxillary sinus tenderness and no frontal sinus tenderness. Left sinus exhibits maxillary sinus tenderness. Left sinus exhibits no frontal sinus tenderness.  Mouth/Throat: Oropharynx is clear and moist and mucous membranes are normal. Mucous membranes are not pale, not dry and not cyanotic. No uvula swelling. No oropharyngeal exudate, posterior oropharyngeal edema, posterior oropharyngeal erythema or tonsillar abscesses.  Eyes: Conjunctivae are normal. Right eye exhibits no discharge. Left eye exhibits no discharge. No scleral icterus.  Neck: Normal range of motion. Neck supple. No JVD present. No thyromegaly present.  Cardiovascular: Normal rate, regular rhythm and  intact distal pulses.  Exam reveals no gallop and no friction rub.   No murmur heard. Pulmonary/Chest: Effort normal and breath sounds normal. No respiratory distress. She has no wheezes. She has no rales. She exhibits no tenderness.  Abdominal: Soft. Bowel sounds are  normal. She exhibits no distension and no mass. There is no tenderness. There is no rebound and no guarding.  Musculoskeletal: Normal range of motion. She exhibits no edema, tenderness or deformity.  Lymphadenopathy:    She has no cervical adenopathy.  Neurological: She is alert and oriented to person, place, and time.  Skin: Skin is warm and dry. No rash noted. She is not diaphoretic. No erythema. No pallor.  Vitals reviewed.   Lab Results  Component Value Date   WBC 7.9 11/05/2016   HGB 13.5 11/05/2016   HCT 39.1 11/05/2016   PLT 222.0 11/05/2016   GLUCOSE 86 12/03/2016   CHOL 184 12/22/2011   TRIG 104.0 12/22/2011   HDL 55.20 12/22/2011   LDLCALC 108 (H) 12/22/2011   ALT 14 12/03/2016   AST 13 12/03/2016   NA 136 12/03/2016   K 3.9 12/03/2016   CL 101 12/03/2016   CREATININE 0.78 12/03/2016   BUN 18 12/03/2016   CO2 29 12/03/2016   TSH 1.00 11/05/2016    US Abdomen Complete  Result Date: 11/25/2016 CLINICAL DATA:  Elevated liver function tests EXAM: ABDOMEN ULTRASOUND COMPLETE COMPARISON:  None. FINDINGS: Gallbladder: No gallstones or wall thickening visualized. No sonographic Murphy sign noted by sonographer. Antegrade flow in the main portal vein. Common bile duct: Diameter: 3 mm Liver: No focal lesion identified. Within normal limits in parenchymal echogenicity. IVC: Limited visualization. Pancreas: Limited visualization. Spleen: Size and appearance within normal limits. Right Kidney: Length: 11 cm. Echogenicity within normal limits. No mass or hydronephrosis visualized. Left Kidney: Length: 11 cm. Echogenicity within normal limits. No mass or hydronephrosis visualized. Abdominal aorta: No aneurysm visualized. Marland Kitchen IMPRESSION: 1. No explanation for the history. 2. Limited visualization of the pancreas. Electronically Signed   By: Marnee Spring M.D.   On: 11/25/2016 09:26    Assessment & Plan:   Jenny Parsons was seen today for sinusitis and allergic rhinitis .  Diagnoses and  all orders for this visit:  Seasonal allergic rhinitis due to pollen -     methylPREDNISolone (MEDROL DOSEPAK) 4 MG TBPK tablet; TAKE AS DIRECTED  Acute non-recurrent maxillary sinusitis -     amoxicillin-clavulanate (AUGMENTIN) 875-125 MG tablet; Take 1 tablet by mouth 2 (two) times daily. -     HYDROcodone-homatropine (HYCODAN) 5-1.5 MG/5ML syrup; Take 5 mLs by mouth every 8 (eight) hours as needed for cough.   I am having Ms. Parsons start on methylPREDNISolone, amoxicillin-clavulanate, and HYDROcodone-homatropine. I am also having her maintain her potassium chloride SA, chlorthalidone, losartan, escitalopram, and fluticasone.  Meds ordered this encounter  Medications  . methylPREDNISolone (MEDROL DOSEPAK) 4 MG TBPK tablet    Sig: TAKE AS DIRECTED    Dispense:  21 tablet    Refill:  0  . amoxicillin-clavulanate (AUGMENTIN) 875-125 MG tablet    Sig: Take 1 tablet by mouth 2 (two) times daily.    Dispense:  20 tablet    Refill:  1  . HYDROcodone-homatropine (HYCODAN) 5-1.5 MG/5ML syrup    Sig: Take 5 mLs by mouth every 8 (eight) hours as needed for cough.    Dispense:  120 mL    Refill:  0     Follow-up: Return in about 3 weeks (around 04/16/2017).  Scarlette Calico, MD

## 2017-03-26 NOTE — Patient Instructions (Signed)

## 2017-04-08 ENCOUNTER — Telehealth: Payer: Self-pay | Admitting: Internal Medicine

## 2017-04-08 ENCOUNTER — Other Ambulatory Visit: Payer: Self-pay | Admitting: Internal Medicine

## 2017-04-08 MED ORDER — FLUCONAZOLE 150 MG PO TABS: 150.0000 mg | ORAL_TABLET | Freq: Once | ORAL | 3 refills | Status: DC

## 2017-04-08 NOTE — Telephone Encounter (Signed)
Pt just finished her 10 days of antibiotics and now she is having yeast infection symptoms. She wanted to know if something could be called in for her to [facility].

## 2017-04-08 NOTE — Telephone Encounter (Signed)
RX sent

## 2017-04-15 DIAGNOSIS — M170 Bilateral primary osteoarthritis of knee: Secondary | ICD-10-CM | POA: Diagnosis not present

## 2017-04-22 DIAGNOSIS — M170 Bilateral primary osteoarthritis of knee: Secondary | ICD-10-CM | POA: Diagnosis not present

## 2017-04-30 DIAGNOSIS — M170 Bilateral primary osteoarthritis of knee: Secondary | ICD-10-CM | POA: Diagnosis not present

## 2017-05-20 ENCOUNTER — Ambulatory Visit (INDEPENDENT_AMBULATORY_CARE_PROVIDER_SITE_OTHER): Payer: 59 | Admitting: Family

## 2017-05-20 ENCOUNTER — Encounter: Payer: Self-pay | Admitting: Family

## 2017-05-20 VITALS — BP 112/80 | HR 64 | Temp 98.4°F | Resp 16 | Ht 60.0 in | Wt 218.0 lb

## 2017-05-20 DIAGNOSIS — M62838 Other muscle spasm: Secondary | ICD-10-CM

## 2017-05-20 MED ORDER — IBUPROFEN-FAMOTIDINE 800-26.6 MG PO TABS: 1.0000 | ORAL_TABLET | Freq: Three times a day (TID) | ORAL | 1 refills | Status: DC | PRN

## 2017-05-20 MED ORDER — TIZANIDINE HCL 2 MG PO CAPS: 2.0000 mg | ORAL_CAPSULE | Freq: Three times a day (TID) | ORAL | 0 refills | Status: DC

## 2017-05-20 MED ORDER — DICLOFENAC SODIUM 2 % TD SOLN: 1.0000 "application " | Freq: Two times a day (BID) | TRANSDERMAL | 1 refills | Status: DC | PRN

## 2017-05-20 NOTE — Assessment & Plan Note (Signed)
New onset cervical paraspinal muscle spasms with no cerumen trauma or injury that she can recall. Treat conservatively with ice/moist heat, home exercise therapy and start Pennsaid and Duexis. Start Zanaflex as needed for muscle spasms. Follow-up if symptoms worsen or do not improve.

## 2017-05-20 NOTE — Progress Notes (Signed)
Subjective:    Patient ID: Jenny Parsons, female    DOB: 1976/01/12, 41 y.o.   MRN: [id number]  Chief Complaint  Patient presents with  . Back Pain    back pain in upper right side, x2 days, wakes her up at night, took ibuprofen but has not helped, having limited ROM    HPI:  Jenny Parsons is a 41 y.o. female who  has a past medical history of Hypertension. and presents today for an office visit.  This is a new problem. Associated symptoms of pain and decreased range of motion located in her upper back has been going on for about 2 days. Denies any trauma or injury. Severity is enough to disturb her sleep. Modifying factors include ibuprofen which has not helped very much. Has also used a heating pad which helped a little. No radiating pain or previous history of injury.   Allergies  Allergen Reactions  . Ramipril Cough      Outpatient Medications Prior to Visit  Medication Sig Dispense Refill  . chlorthalidone (HYGROTON) 25 MG tablet Take 0.5 tablets (12.5 mg total) by mouth daily. 45 tablet 1  . escitalopram (LEXAPRO) 10 MG tablet Take 1 tablet (10 mg total) by mouth daily. 90 tablet 1  . fluticasone (FLONASE) 50 MCG/ACT nasal spray Place 2 sprays into both nostrils daily. 16 g 6  . HYDROcodone-homatropine (HYCODAN) 5-1.5 MG/5ML syrup Take 5 mLs by mouth every 8 (eight) hours as needed for cough. 120 mL 0  . potassium chloride SA (K-DUR,KLOR-CON) 20 MEQ tablet Take 1 tablet (20 mEq total) by mouth 2 (two) times daily. 180 tablet 1  . amoxicillin-clavulanate (AUGMENTIN) 875-125 MG tablet Take 1 tablet by mouth 2 (two) times daily. 20 tablet 1  . losartan (COZAAR) 100 MG tablet Take 1 tablet (100 mg total) by mouth daily. 90 tablet 1   No facility-administered medications prior to visit.       Past Surgical History:  Procedure Laterality Date  . TUBAL LIGATION        Past Medical History:  Diagnosis Date  . Hypertension       Review of Systems    Constitutional: Negative for chills and fever.  Respiratory: Negative for chest tightness and shortness of breath.   Cardiovascular: Negative for chest pain, palpitations and leg swelling.  Musculoskeletal: Positive for neck stiffness.      Objective:    BP 112/80 (BP Location: Left Arm, Patient Position: Sitting, Cuff Size: Large)   Pulse 64   Temp 98.4 F (36.9 C) (Oral)   Resp 16   Ht 5' (1.524 m)   Wt 218 lb (98.9 kg)   SpO2 98%   BMI 42.58 kg/m  Nursing note and vital signs reviewed.  Physical Exam  Constitutional: She is oriented to person, place, and time. She appears well-developed and well-nourished. No distress.  Neck:  No obvious deformity, discoloration, or edema. Palpable tenderness and muscle spasm of the right upper trapezius. Range of motion restricted and lateral bending and rotation. Distal pulses and sensation are intact and appropriate. Negative cervical compression test.  Cardiovascular: Normal rate, regular rhythm, normal heart sounds and intact distal pulses.   Pulmonary/Chest: Effort normal and breath sounds normal.  Neurological: She is alert and oriented to person, place, and time.  Skin: Skin is warm and dry.  Psychiatric: She has a normal mood and affect. Her behavior is normal. Judgment and thought content normal.       Assessment &  Plan:   Problem List Items Addressed This Visit      Other   Cervical paraspinal muscle spasm - Primary    New onset cervical paraspinal muscle spasms with no cerumen trauma or injury that she can recall. Treat conservatively with ice/moist heat, home exercise therapy and start Pennsaid and Duexis. Start Zanaflex as needed for muscle spasms. Follow-up if symptoms worsen or do not improve.          I have discontinued [name]'s losartan and amoxicillin-clavulanate. I am also having her start on Ibuprofen-Famotidine, Diclofenac Sodium, and tizanidine. Additionally, I am having her maintain her potassium  chloride SA, chlorthalidone, escitalopram, fluticasone, and HYDROcodone-homatropine.   Meds ordered this encounter  Medications  . Ibuprofen-Famotidine 800-26.6 MG TABS    Sig: Take 1 tablet by mouth 3 (three) times daily as needed.    Dispense:  90 tablet    Refill:  1    Order Specific Question:   Supervising Provider    Answer:   Hillard Danker A [[id number]]  . Diclofenac Sodium (PENNSAID) 2 % SOLN    Sig: Place 1 application onto the skin 2 (two) times daily as needed.    Dispense:  112 g    Refill:  1    Order Specific Question:   Supervising Provider    Answer:   Hillard Danker A [[id number]]  . tiZANidine (ZANAFLEX) 2 MG capsule    Sig: Take 1-2 capsules (2-4 mg total) by mouth 3 (three) times daily.    Dispense:  30 capsule    Refill:  0    Order Specific Question:   Supervising Provider    Answer:   Hillard Danker A [[id number]]     Follow-up: Return if symptoms worsen or fail to improve.  Jeanine Luz, FNP

## 2017-05-20 NOTE — Patient Instructions (Signed)
Thank you for choosing [facility]!  Ice / moist heat x 20 minutes every 2 hours and as needed or following activity  Exercises 1-2 times per day as instructed.    Medications  Pennsaid - Approximately 1/2 packet to the affected site twice daily.  Duexis - 1 tablet 3 times per day for the next 5-7 days and then as needed.  Your prescription(s) have been submitted to your pharmacy or been printed and provided for you. Please take as directed and contact our office if you believe you are having problem(s) with the medication(s) or have any questions.  If your symptoms worsen or fail to improve, please contact our office for further instruction, or in case of emergency go directly to the emergency room at the closest medical facility.    Cervical Strain and Sprain Rehab Ask your health care provider which exercises are safe for you. Do exercises exactly as told by your health care provider and adjust them as directed. It is normal to feel mild stretching, pulling, tightness, or discomfort as you do these exercises, but you should stop right away if you feel sudden pain or your pain gets worse.Do not begin these exercises until told by your health care provider. Stretching and range of motion exercises These exercises warm up your muscles and joints and improve the movement and flexibility of your neck. These exercises also help to relieve pain, numbness, and tingling. Exercise A: Cervical side bend  1. Using good posture, sit on a stable chair or stand up. 2. Without moving your shoulders, slowly tilt your left / right ear to your shoulder until you feel a stretch in your neck muscles. You should be looking straight ahead. 3. Hold for __________ seconds. 4. Repeat with the other side of your neck. Repeat __________ times. Complete this exercise __________ times a day. Exercise B: Cervical rotation  1. Using good posture, sit on a stable chair or stand up. 2. Slowly turn your head  to the side as if you are looking over your left / right shoulder. ? Keep your eyes level with the ground. ? Stop when you feel a stretch along the side and the back of your neck. 3. Hold for __________ seconds. 4. Repeat this by turning to your other side. Repeat __________ times. Complete this exercise __________ times a day. Exercise C: Thoracic extension and pectoral stretch 1. Roll a towel or a small blanket so it is about 4 inches (10 cm) in diameter. 2. Lie down on your back on a firm surface. 3. Put the towel lengthwise, under your spine in the middle of your back. It should not be not under your shoulder blades. The towel should line up with your spine from your middle back to your lower back. 4. Put your hands behind your head and let your elbows fall out to your sides. 5. Hold for __________ seconds. Repeat __________ times. Complete this exercise __________ times a day. Strengthening exercises These exercises build strength and endurance in your neck. Endurance is the ability to use your muscles for a long time, even after your muscles get tired. Exercise D: Upper cervical flexion, isometric 1. Lie on your back with a thin pillow behind your head and a small rolled-up towel under your neck. 2. Gently tuck your chin toward your chest and nod your head down to look toward your feet. Do not lift your head off the pillow. 3. Hold for __________ seconds. 4. Release the tension slowly. Relax your  neck muscles completely before you repeat this exercise. Repeat __________ times. Complete this exercise __________ times a day. Exercise E: Cervical extension, isometric  1. Stand about 6 inches (15 cm) away from a wall, with your back facing the wall. 2. Place a soft object, about 6-8 inches (15-20 cm) in diameter, between the back of your head and the wall. A soft object could be a small pillow, a ball, or a folded towel. 3. Gently tilt your head back and press into the soft object. Keep  your jaw and forehead relaxed. 4. Hold for __________ seconds. 5. Release the tension slowly. Relax your neck muscles completely before you repeat this exercise. Repeat __________ times. Complete this exercise __________ times a day. Posture and body mechanics  Body mechanics refers to the movements and positions of your body while you do your daily activities. Posture is part of body mechanics. Good posture and healthy body mechanics can help to relieve stress in your body's tissues and joints. Good posture means that your spine is in its natural S-curve position (your spine is neutral), your shoulders are pulled back slightly, and your head is not tipped forward. The following are general guidelines for applying improved posture and body mechanics to your everyday activities. Standing  When standing, keep your spine neutral and keep your feet about hip-width apart. Keep a slight bend in your knees. Your ears, shoulders, and hips should line up.  When you do a task in which you stand in one place for a long time, place one foot up on a stable object that is 2-4 inches (5-10 cm) high, such as a footstool. This helps keep your spine neutral. Sitting   When sitting, keep your spine neutral and your keep feet flat on the floor. Use a footrest, if necessary, and keep your thighs parallel to the floor. Avoid rounding your shoulders, and avoid tilting your head forward.  When working at a desk or a computer, keep your desk at a height where your hands are slightly lower than your elbows. Slide your chair under your desk so you are close enough to maintain good posture.  When working at a computer, place your monitor at a height where you are looking straight ahead and you do not have to tilt your head forward or downward to look at the screen. Resting When lying down and resting, avoid positions that are most painful for you. Try to support your neck in a neutral position. You can use a contour pillow  or a small rolled-up towel. Your pillow should support your neck but not push on it. This information is not intended to replace advice given to you by your health care provider. Make sure you discuss any questions you have with your health care provider. Document Released: 09/08/2005 Document Revised: 05/15/2016 Document Reviewed: 08/15/2015 Elsevier Interactive Patient Education  [employer].

## 2017-06-25 ENCOUNTER — Other Ambulatory Visit: Payer: Self-pay | Admitting: Internal Medicine

## 2017-06-25 DIAGNOSIS — F418 Other specified anxiety disorders: Secondary | ICD-10-CM

## 2017-06-25 DIAGNOSIS — I10 Essential (primary) hypertension: Secondary | ICD-10-CM

## 2017-06-26 ENCOUNTER — Other Ambulatory Visit: Payer: Self-pay | Admitting: Internal Medicine

## 2017-06-26 DIAGNOSIS — I10 Essential (primary) hypertension: Secondary | ICD-10-CM

## 2017-06-30 NOTE — Telephone Encounter (Signed)
Pt called asking why this was refused, please advise and call back she is completely out

## 2017-06-30 NOTE — Telephone Encounter (Signed)
Pt needs a follow up. LOV needed pt to come back in 3 weeks. If she can schedule, I can send in a refill.

## 2017-07-01 MED ORDER — LOSARTAN POTASSIUM 100 MG PO TABS: 100.0000 mg | ORAL_TABLET | Freq: Every day | ORAL | 0 refills | Status: DC

## 2017-07-01 NOTE — Addendum Note (Signed)
Addended by: Radford Pax M on: 07/01/2017 10:47 AM   Modules accepted: Orders

## 2017-07-01 NOTE — Telephone Encounter (Signed)
Patient scheduled appt for 07/14/17. She asked if you could send in prescription until that appt. Thanks.

## 2017-07-01 NOTE — Telephone Encounter (Signed)
erx sent

## 2017-07-14 ENCOUNTER — Ambulatory Visit: Payer: 59 | Admitting: Internal Medicine

## 2017-07-23 ENCOUNTER — Encounter: Payer: Self-pay | Admitting: Internal Medicine

## 2017-07-23 ENCOUNTER — Other Ambulatory Visit (INDEPENDENT_AMBULATORY_CARE_PROVIDER_SITE_OTHER): Payer: 59

## 2017-07-23 ENCOUNTER — Ambulatory Visit (INDEPENDENT_AMBULATORY_CARE_PROVIDER_SITE_OTHER): Payer: 59 | Admitting: Internal Medicine

## 2017-07-23 VITALS — BP 116/78 | HR 75 | Temp 97.9°F | Resp 16 | Ht 60.0 in | Wt 215.0 lb

## 2017-07-23 DIAGNOSIS — Z0000 Encounter for general adult medical examination without abnormal findings: Secondary | ICD-10-CM

## 2017-07-23 DIAGNOSIS — J301 Allergic rhinitis due to pollen: Secondary | ICD-10-CM

## 2017-07-23 DIAGNOSIS — I10 Essential (primary) hypertension: Secondary | ICD-10-CM

## 2017-07-23 DIAGNOSIS — E876 Hypokalemia: Secondary | ICD-10-CM

## 2017-07-23 LAB — COMPREHENSIVE METABOLIC PANEL
ALBUMIN: 4.1 g/dL (ref 3.5–5.2)
ALT: 11 U/L (ref 0–35)
AST: 14 U/L (ref 0–37)
Alkaline Phosphatase: 81 U/L (ref 39–117)
BILIRUBIN TOTAL: 0.5 mg/dL (ref 0.2–1.2)
BUN: 16 mg/dL (ref 6–23)
CO2: 30 meq/L (ref 19–32)
Calcium: 9.5 mg/dL (ref 8.4–10.5)
Chloride: 102 mEq/L (ref 96–112)
Creatinine, Ser: 0.64 mg/dL (ref 0.40–1.20)
GFR: 109.67 mL/min (ref >60.00)
GLUCOSE: 96 mg/dL (ref 70–99)
Potassium: 3.1 mEq/L — ABNORMAL LOW (ref 3.5–5.1)
SODIUM: 140 meq/L (ref 135–145)
TOTAL PROTEIN: 6.9 g/dL (ref 6.0–8.3)

## 2017-07-23 LAB — CBC WITH DIFFERENTIAL/PLATELET
BASOS ABS: 0 10*3/uL (ref 0.0–0.1)
Basophils Relative: 0.4 % (ref 0.0–3.0)
Eosinophils Absolute: 0.2 10*3/uL (ref 0.0–0.7)
Eosinophils Relative: 3 % (ref 0.0–5.0)
HCT: 40 % (ref 36.0–46.0)
Hemoglobin: 13.5 g/dL (ref 12.0–15.0)
LYMPHS ABS: 2 10*3/uL (ref 0.7–4.0)
Lymphocytes Relative: 24.9 % (ref 12.0–46.0)
MCHC: 33.8 g/dL (ref 30.0–36.0)
MCV: 86.8 fl (ref 78.0–100.0)
MONOS PCT: 5.5 % (ref 3.0–12.0)
Monocytes Absolute: 0.4 10*3/uL (ref 0.1–1.0)
NEUTROS ABS: 5.2 10*3/uL (ref 1.4–7.7)
NEUTROS PCT: 66.2 % (ref 43.0–77.0)
PLATELETS: 206 10*3/uL (ref 150.0–400.0)
RBC: 4.61 Mil/uL (ref 3.87–5.11)
RDW: 13.4 % (ref 11.5–15.5)
WBC: 7.9 10*3/uL (ref 4.0–10.5)

## 2017-07-23 LAB — BASIC METABOLIC PANEL
BUN: 16 mg/dL (ref 6–23)
CALCIUM: 9.5 mg/dL (ref 8.4–10.5)
CO2: 30 mEq/L (ref 19–32)
Chloride: 102 mEq/L (ref 96–112)
Creatinine, Ser: 0.64 mg/dL (ref 0.40–1.20)
GFR: 109.67 mL/min (ref >60.00)
Glucose, Bld: 96 mg/dL (ref 70–99)
POTASSIUM: 3.1 meq/L — AB (ref 3.5–5.1)
Sodium: 140 mEq/L (ref 135–145)

## 2017-07-23 LAB — LIPID PANEL
CHOLESTEROL: 199 mg/dL (ref 0–200)
HDL: 51.1 mg/dL (ref >39.00)
LDL Cholesterol: 128 mg/dL — ABNORMAL HIGH (ref 0–99)
NonHDL: 148.19
Total CHOL/HDL Ratio: 4
Triglycerides: 102 mg/dL (ref 0.0–149.0)
VLDL: 20.4 mg/dL (ref 0.0–40.0)

## 2017-07-23 LAB — TSH: TSH: 1.95 u[IU]/mL (ref 0.35–4.50)

## 2017-07-23 MED ORDER — METHYLPREDNISOLONE 4 MG PO TBPK: ORAL_TABLET | ORAL | 0 refills | Status: DC

## 2017-07-23 MED ORDER — LEVOCETIRIZINE DIHYDROCHLORIDE 5 MG PO TABS: 5.0000 mg | ORAL_TABLET | Freq: Every evening | ORAL | 3 refills | Status: DC

## 2017-07-23 MED ORDER — POTASSIUM CHLORIDE CRYS ER 20 MEQ PO TBCR: 20.0000 meq | EXTENDED_RELEASE_TABLET | Freq: Three times a day (TID) | ORAL | 1 refills | Status: DC

## 2017-07-23 NOTE — Patient Instructions (Signed)

## 2017-07-23 NOTE — Progress Notes (Signed)
Subjective:  Patient ID: Jenny Parsons, female    DOB: 1975/10/09  Age: 41 y.o. MRN: [id number]  CC: Annual Exam; Hypertension; and Allergic Rhinitis    HPI Jenny Parsons presents for a CPX.  She complains of a 2-week history of severe nasal congestion, runny nose, and postnasal drip.  She has not gotten symptom relief with Zyrtec, Claritin, and Flonase nasal spray.  She tells me her blood pressure has been well controlled.  Outpatient Medications Prior to Visit  Medication Sig Dispense Refill  . escitalopram (LEXAPRO) 10 MG tablet Take 1 tablet (10 mg total) by mouth daily. 90 tablet 1  . fluticasone (FLONASE) 50 MCG/ACT nasal spray Place 2 sprays into both nostrils daily. 16 g 6  . losartan (COZAAR) 100 MG tablet Take 1 tablet (100 mg total) by mouth daily. 90 tablet 0  . chlorthalidone (HYGROTON) 25 MG tablet Take 0.5 tablets (12.5 mg total) by mouth daily. 45 tablet 1  . Diclofenac Sodium (PENNSAID) 2 % SOLN Place 1 application onto the skin 2 (two) times daily as needed. 112 g 1  . Ibuprofen-Famotidine 800-26.6 MG TABS Take 1 tablet by mouth 3 (three) times daily as needed. 90 tablet 1  . potassium chloride SA (K-DUR,KLOR-CON) 20 MEQ tablet Take 1 tablet (20 mEq total) by mouth 2 (two) times daily. 180 tablet 1  . tiZANidine (ZANAFLEX) 2 MG capsule Take 1-2 capsules (2-4 mg total) by mouth 3 (three) times daily. 30 capsule 0  . HYDROcodone-homatropine (HYCODAN) 5-1.5 MG/5ML syrup Take 5 mLs by mouth every 8 (eight) hours as needed for cough. 120 mL 0   No facility-administered medications prior to visit.     ROS Review of Systems  Constitutional: Negative.  Negative for chills, fatigue and fever.  HENT: Positive for congestion, postnasal drip and rhinorrhea. Negative for ear pain, facial swelling, nosebleeds, sinus pressure, sinus pain, sore throat, trouble swallowing and voice change.   Eyes: Negative.   Respiratory: Negative.  Negative for cough, shortness of  breath and wheezing.   Cardiovascular: Negative for chest pain and leg swelling.  Gastrointestinal: Negative for abdominal pain, constipation, diarrhea, nausea and vomiting.  Endocrine: Negative.   Genitourinary: Negative.  Negative for difficulty urinating.  Musculoskeletal: Negative.  Negative for arthralgias.  Skin: Negative.  Negative for color change and rash.  Allergic/Immunologic: Negative.   Neurological: Negative.  Negative for dizziness, weakness, light-headedness and headaches.  Hematological: Negative for adenopathy. Does not bruise/bleed easily.  Psychiatric/Behavioral: Negative.     Objective:  BP 116/78 (BP Location: Left Arm, Patient Position: Sitting, Cuff Size: Large)   Pulse 75   Temp 97.9 F (36.6 C) (Oral)   Resp 16   Ht 5' (1.524 m)   Wt 215 lb (97.5 kg)   SpO2 98%   BMI 41.99 kg/m   BP Readings from Last 3 Encounters:  07/23/17 116/78  05/20/17 112/80  03/26/17 124/84    Wt Readings from Last 3 Encounters:  07/23/17 215 lb (97.5 kg)  05/20/17 218 lb (98.9 kg)  03/26/17 219 lb (99.3 kg)    Physical Exam  Constitutional: She is oriented to person, place, and time. No distress.  HENT:  Nose: Mucosal edema and rhinorrhea present. No epistaxis. Right sinus exhibits no maxillary sinus tenderness and no frontal sinus tenderness. Left sinus exhibits no maxillary sinus tenderness and no frontal sinus tenderness.  Mouth/Throat: Oropharynx is clear and moist and mucous membranes are normal. Mucous membranes are not pale, not dry and not cyanotic.  No oropharyngeal exudate.  Eyes: Conjunctivae are normal. Right eye exhibits no discharge. Left eye exhibits no discharge. No scleral icterus.  Neck: Normal range of motion. Neck supple. No JVD present. No thyromegaly present.  Cardiovascular: Normal rate, regular rhythm and intact distal pulses. Exam reveals no gallop and no friction rub.  No murmur heard. Pulmonary/Chest: Effort normal and breath sounds normal. No  respiratory distress. She has no wheezes. She has no rales. She exhibits no tenderness.  Abdominal: Soft. Bowel sounds are normal. She exhibits no distension and no mass. There is no tenderness. There is no rebound and no guarding.  Musculoskeletal: Normal range of motion. She exhibits no edema, tenderness or deformity.  Lymphadenopathy:    She has no cervical adenopathy.  Neurological: She is alert and oriented to person, place, and time.  Skin: Skin is warm and dry. No rash noted. She is not diaphoretic. No erythema. No pallor.  Vitals reviewed.   Lab Results  Component Value Date   WBC 7.9 07/23/2017   HGB 13.5 07/23/2017   HCT 40.0 07/23/2017   PLT 206.0 07/23/2017   GLUCOSE 96 07/23/2017   GLUCOSE 96 07/23/2017   CHOL 199 07/23/2017   TRIG 102.0 07/23/2017   HDL 51.10 07/23/2017   LDLCALC 128 (H) 07/23/2017   ALT 11 07/23/2017   AST 14 07/23/2017   NA 140 07/23/2017   NA 140 07/23/2017   K 3.1 (L) 07/23/2017   K 3.1 (L) 07/23/2017   CL 102 07/23/2017   CL 102 07/23/2017   CREATININE 0.64 07/23/2017   CREATININE 0.64 07/23/2017   BUN 16 07/23/2017   BUN 16 07/23/2017   CO2 30 07/23/2017   CO2 30 07/23/2017   TSH 1.95 07/23/2017    US Abdomen Complete  Result Date: 11/25/2016 CLINICAL DATA:  Elevated liver function tests EXAM: ABDOMEN ULTRASOUND COMPLETE COMPARISON:  None. FINDINGS: Gallbladder: No gallstones or wall thickening visualized. No sonographic Murphy sign noted by sonographer. Antegrade flow in the main portal vein. Common bile duct: Diameter: 3 mm Liver: No focal lesion identified. Within normal limits in parenchymal echogenicity. IVC: Limited visualization. Pancreas: Limited visualization. Spleen: Size and appearance within normal limits. Right Kidney: Length: 11 cm. Echogenicity within normal limits. No mass or hydronephrosis visualized. Left Kidney: Length: 11 cm. Echogenicity within normal limits. No mass or hydronephrosis visualized. Abdominal aorta: No  aneurysm visualized. Marland Kitchen IMPRESSION: 1. No explanation for the history. 2. Limited visualization of the pancreas. Electronically Signed   By: Marnee Spring M.D.   On: 11/25/2016 09:26    Assessment & Plan:   Jenny Parsons was seen today for annual exam, hypertension and allergic rhinitis .  Diagnoses and all orders for this visit:  Essential hypertension, benign- her blood pressure is slightly over controlled and she has worsening hypokalemia so I have asked her to stop taking chlorthalidone.  Will continue the ARB at current dose.  If she needs to restart a diuretic then will start a potassium sparing diuretic.  She will monitor her blood pressure at home and will let me know if it rises significantly. -     Basic metabolic panel; Future -     potassium chloride SA (K-DUR,KLOR-CON) 20 MEQ tablet; Take 1 tablet (20 mEq total) by mouth 3 (three) times daily.  Seasonal allergic rhinitis due to pollen- she is having a significant flare so we will upgrade to a more potent antihistamine and will also try a course of systemic steroids. -     levocetirizine (XYZAL)  5 MG tablet; Take 1 tablet (5 mg total) by mouth every evening. -     methylPREDNISolone (MEDROL DOSEPAK) 4 MG TBPK tablet; TAKE AS DIRECTED  Routine general medical examination at a health care facility- exam completed, labs reviewed, vaccines reviewed and updated, patient education material was given. -     Lipid panel; Future -     Comprehensive metabolic panel; Future -     CBC with Differential/Platelet; Future -     TSH; Future  Hypokalemia- Will discontinue chlorthalidone and I have asked her to increase her dose of potassium. -     potassium chloride SA (K-DUR,KLOR-CON) 20 MEQ tablet; Take 1 tablet (20 mEq total) by mouth 3 (three) times daily.   I have discontinued Jenny Parsons's HYDROcodone-homatropine, Ibuprofen-Famotidine, Diclofenac Sodium, tizanidine, and chlorthalidone. I have also changed her potassium chloride SA.  Additionally, I am having her start on levocetirizine and methylPREDNISolone. Lastly, I am having her maintain her fluticasone, escitalopram, and losartan.  Meds ordered this encounter  Medications  . levocetirizine (XYZAL) 5 MG tablet    Sig: Take 1 tablet (5 mg total) by mouth every evening.    Dispense:  90 tablet    Refill:  3  . methylPREDNISolone (MEDROL DOSEPAK) 4 MG TBPK tablet    Sig: TAKE AS DIRECTED    Dispense:  21 tablet    Refill:  0  . potassium chloride SA (K-DUR,KLOR-CON) 20 MEQ tablet    Sig: Take 1 tablet (20 mEq total) by mouth 3 (three) times daily.    Dispense:  270 tablet    Refill:  1     Follow-up: Return in about 6 months (around 01/20/2018).  Sanda Lingerhomas Jamekia Gannett, MD

## 2017-07-29 ENCOUNTER — Encounter: Payer: Self-pay | Admitting: Internal Medicine

## 2017-07-29 ENCOUNTER — Other Ambulatory Visit: Payer: Self-pay | Admitting: Internal Medicine

## 2017-07-29 DIAGNOSIS — J0100 Acute maxillary sinusitis, unspecified: Secondary | ICD-10-CM

## 2017-07-29 MED ORDER — AMOXICILLIN-POT CLAVULANATE 875-125 MG PO TABS: 1.0000 | ORAL_TABLET | Freq: Two times a day (BID) | ORAL | 0 refills | Status: DC

## 2017-08-25 NOTE — Telephone Encounter (Signed)
error 

## 2017-10-02 ENCOUNTER — Telehealth: Payer: Self-pay

## 2017-10-02 ENCOUNTER — Ambulatory Visit (INDEPENDENT_AMBULATORY_CARE_PROVIDER_SITE_OTHER): Payer: 59 | Admitting: Family

## 2017-10-02 ENCOUNTER — Encounter: Payer: Self-pay | Admitting: Family

## 2017-10-02 VITALS — BP 130/100 | HR 80 | Temp 98.5°F | Ht 60.0 in | Wt 213.8 lb

## 2017-10-02 DIAGNOSIS — J0100 Acute maxillary sinusitis, unspecified: Secondary | ICD-10-CM | POA: Diagnosis not present

## 2017-10-02 DIAGNOSIS — I10 Essential (primary) hypertension: Secondary | ICD-10-CM

## 2017-10-02 DIAGNOSIS — J3489 Other specified disorders of nose and nasal sinuses: Secondary | ICD-10-CM | POA: Diagnosis not present

## 2017-10-02 MED ORDER — DOXYCYCLINE HYCLATE 100 MG PO TABS: 100.0000 mg | ORAL_TABLET | Freq: Two times a day (BID) | ORAL | 0 refills | Status: DC

## 2017-10-02 MED ORDER — MUPIROCIN CALCIUM 2 % EX CREA: 1.0000 "application " | TOPICAL_CREAM | Freq: Four times a day (QID) | CUTANEOUS | 0 refills | Status: DC

## 2017-10-02 NOTE — Telephone Encounter (Signed)
Copied from CRM [id number]. Topic: Quick Communication - Rx Refill/Question >> Oct 02, 2017  3:18 PM Jenny Parsons, Jenny Parsons wrote: Medication: mupirocin cream (BACTROBAN) 2 %   Has the patient contacted their pharmacy? Yes   (Agent: If no, request that the patient contact the pharmacy for the refill.)   Preferred Pharmacy (with phone number or street name): [facility] - [place] - [address]   Agent: Please be advised that RX refills may take up to 3 business days. We ask that you follow-up with your pharmacy. Pharmacy called and would like cream change because the cost is high. They would like to change it to BACTROBAN ointment instead.

## 2017-10-02 NOTE — Progress Notes (Signed)
Jenny Parsons is a 42 y.o. female with the following history as recorded in EpicCare:  Patient Active Problem List   Diagnosis Date Noted  . Cervical paraspinal muscle spasm 05/20/2017  . Allergic rhinitis 02/02/2017  . Depression with anxiety 11/06/2016  . Snoring 11/05/2016  . Acute maxillary sinusitis 11/14/2015  . Essential hypertension, benign 12/22/2011  . Routine general medical examination at a health care facility 12/22/2011    Current Outpatient Medications  Medication Sig Dispense Refill  . escitalopram (LEXAPRO) 10 MG tablet Take 1 tablet (10 mg total) by mouth daily. 90 tablet 1  . fluticasone (FLONASE) 50 MCG/ACT nasal spray Place 2 sprays into both nostrils daily. 16 g 6  . levocetirizine (XYZAL) 5 MG tablet Take 1 tablet (5 mg total) by mouth every evening. 90 tablet 3  . losartan (COZAAR) 100 MG tablet Take 1 tablet (100 mg total) by mouth daily. 90 tablet 0  . potassium chloride SA (K-DUR,KLOR-CON) 20 MEQ tablet Take 1 tablet (20 mEq total) by mouth 3 (three) times daily. 270 tablet 1  . doxycycline (VIBRA-TABS) 100 MG tablet Take 1 tablet (100 mg total) by mouth 2 (two) times daily. 20 tablet 0  . mupirocin cream (BACTROBAN) 2 % Apply 1 application topically 4 (four) times daily. 15 g 0   No current facility-administered medications for this visit.     Allergies: Ramipril  Past Medical History:  Diagnosis Date  . Hypertension     Past Surgical History:  Procedure Laterality Date  . TUBAL LIGATION      Family History  Problem Relation Age of Onset  . Hypertension Mother   . Cancer Neg Hx   . Diabetes Neg Hx   . Stroke Neg Hx     Social History   Tobacco Use  . Smoking status: Never Smoker  . Smokeless tobacco: Never Used  Substance Use Topics  . Alcohol use: No    Subjective:  Patient presents with concerns for sinus infection; prone to recurrent sinus infection; takes allergy medication year-round; has been taking Sudafed x 3-4 days; +  headache; + sinus pressure; also notes that feels like she has a "sore" in her right nostril;   Objective:  Vitals:   10/02/17 1432  BP: (!) 130/100  Pulse: 80  Temp: 98.5 F (36.9 C)  TempSrc: Oral  SpO2: 98%  Weight: 213 lb 12 oz (97 kg)  Height: 5' (1.524 m)    General: Well developed, well nourished, in no acute distress  Skin : Warm and dry.  Head: Normocephalic and atraumatic  Eyes: Sclera and conjunctiva clear; pupils round and reactive to light; extraocular movements intact  Ears: External normal; canals clear; tympanic membranes normal  Oropharynx: Pink, supple. No suspicious lesions  Neck: Supple without thyromegaly, adenopathy  Lungs: Respirations unlabored; clear to auscultation bilaterally without wheeze, rales, rhonchi  CVS exam: normal rate and regular rhythm.  Abdomen: Soft; nontender; nondistended; normoactive bowel sounds; no masses or hepatosplenomegaly  Musculoskeletal: No deformities; no active joint inflammation  Extremities: No edema, cyanosis, clubbing  Vessels: Symmetric bilaterally  Neurologic: Alert and oriented; speech intact; face symmetrical; moves all extremities well; CNII-XII intact without focal deficit  Assessment:  1. Acute maxillary sinusitis, recurrence not specified   2. Essential hypertension, benign   3. Nasal sore     Plan:  1. Rx for Doxycycline 100 mg bid x 10 days; will use this as am concerned about nasal staph infection and should cover for both issues; continue Zyrtec  and flonase; recommend to stop Sudafed in the next 24 hours; 2. Elevated today; suspect due to Sudafed; monitor and follow up if remains above 140/90; 3. Rx for Bactroban- apply with Q-tip up to 4 x per day; follow-up worse, no better.   No Follow-up on file.  No orders of the defined types were placed in this encounter.   Requested Prescriptions   Signed Prescriptions Disp Refills  . doxycycline (VIBRA-TABS) 100 MG tablet 20 tablet 0    Sig: Take 1 tablet (100  mg total) by mouth 2 (two) times daily.  . mupirocin cream (BACTROBAN) 2 % 15 g 0    Sig: Apply 1 application topically 4 (four) times daily.

## 2017-10-05 ENCOUNTER — Other Ambulatory Visit: Payer: Self-pay | Admitting: Internal Medicine

## 2017-10-05 ENCOUNTER — Other Ambulatory Visit: Payer: Self-pay | Admitting: Family

## 2017-10-05 MED ORDER — MUPIROCIN 2 % EX OINT: 1.0000 "application " | TOPICAL_OINTMENT | Freq: Two times a day (BID) | CUTANEOUS | 0 refills | Status: DC

## 2017-10-05 NOTE — Telephone Encounter (Signed)
I will send in Bactroban ointment as requested- Thanks

## 2017-10-05 NOTE — Telephone Encounter (Signed)
Pls advise on msg below.../lmb 

## 2017-11-18 ENCOUNTER — Other Ambulatory Visit: Payer: Self-pay | Admitting: Internal Medicine

## 2017-12-14 DIAGNOSIS — Z01419 Encounter for gynecological examination (general) (routine) without abnormal findings: Secondary | ICD-10-CM | POA: Diagnosis not present

## 2017-12-14 DIAGNOSIS — Z803 Family history of malignant neoplasm of breast: Secondary | ICD-10-CM | POA: Diagnosis not present

## 2017-12-14 DIAGNOSIS — Z6839 Body mass index (BMI) 39.0-39.9, adult: Secondary | ICD-10-CM | POA: Diagnosis not present

## 2017-12-16 ENCOUNTER — Other Ambulatory Visit: Payer: Self-pay | Admitting: Obstetrics and Gynecology

## 2017-12-16 DIAGNOSIS — N644 Mastodynia: Secondary | ICD-10-CM

## 2017-12-17 ENCOUNTER — Ambulatory Visit: Payer: Self-pay

## 2017-12-17 NOTE — Telephone Encounter (Signed)
Patient called with c/o "increased anxiety/panic attacks." She says "I have been dealing with a lot lately, working an extra job, coming up on a year of separation from my husband, caring for my kids, just a lot of stress and I feel like I am panicky. I felt a heaviness on my chest and had to get up and walk away at work to get myself together. I can cry at the drop of a hat. I have an appointment on Tuesday with Dr. Yetta Barre, but I feel like I need to be seen earlier if I can. If not, I will just wait." I asked about thoughts/plans to harm herself or anyone else, she denies. She says "I was started on Lexapro a few months ago and I don't feel like it's working; it may need increasing."  According to protocol, see PCP within 3 days, no availability with provider, appointment made for tomorrow, 12/18/17 at 0920 with Ria Clock, NP, care advice given, patient verbalized understanding.  Reason for Disposition . [1] Started on anti-anxiety medication AND [2] no relief  Answer Assessment - Initial Assessment Questions 1. CONCERN: "What happened that made you call today?"     Feel like I need to be seen sooner, more anxiety 2. ANXIETY SYMPTOM SCREENING: "Can you describe how you have been feeling?"  (e.g., tense, restless, panicky, anxious, keyed up, trouble sleeping, trouble concentrating)     Anxiety, stress, panic 3. ONSET: "How long have you been feeling this way?"     A couple of months has gotten worse 4. RECURRENT: "Have you felt this way before?"  If yes: "What happened that time?" "What helped these feelings go away in the past?"      Yes-started on lexapro; feeling anxious, panicky 5. RISK OF HARM - SUICIDAL IDEATION:  "Do you ever have thoughts of hurting or killing yourself?"  (e.g., yes, no, no but preoccupation with thoughts about death)   - INTENT:  "Do you have thoughts of hurting or killing yourself right NOW?" (e.g., yes, no, N/A) No   - PLAN: "Do you have a specific plan for how you would  do this?" (e.g., gun, knife, overdose, no plan, N/A)     No 6. RISK OF HARM - HOMICIDAL IDEATION:  "Do you ever have thoughts of hurting or killing someone else?"  (e.g., yes, no, no but preoccupation with thoughts about death)   - INTENT:  "Do you have thoughts of hurting or killing someone right NOW?" (e.g., yes, no, N/A) No   - PLAN: "Do you have a specific plan for how you would do this?" (e.g., gun, knife, no plan, N/A)      No 7. FUNCTIONAL IMPAIRMENT: "How have things been going for you overall in your life? Have you had any more difficulties than usual doing your normal daily activities?"  (e.g., better, same, worse; self-care, school, work, interactions)     Still function at work, can cry at the drop of a hat 8. SUPPORT: "Who is with you now?" "Who do you live with?" "Do you have family or friends nearby who you can talk to?"      Yes 9. THERAPIST: "Do you have a counselor or therapist? Name?"     No 10. STRESSORS: "Has there been any new stress or recent changes in your life?"       Separation, 2nd job to support kid 11. CAFFEINE ABUSE: "Do you drink caffeinated beverages, and how much each day?" (e.g., coffee, tea, colas)  Yes, hard to put into perspective how much I drink 12. SUBSTANCE ABUSE: "Do you use any illegal drugs or alcohol?"       No drugs; occasional alcohol every couple months 13. OTHER SYMPTOMS: "Do you have any other physical symptoms right now?" (e.g., chest pain, palpitations, difficulty breathing, fever)       No 14. PREGNANCY: "Is there any chance you are pregnant?" "When was your last menstrual period?"       No  Protocols used: ANXIETY AND PANIC ATTACK-A-AH

## 2017-12-18 ENCOUNTER — Encounter: Payer: Self-pay | Admitting: Family

## 2017-12-18 ENCOUNTER — Ambulatory Visit (INDEPENDENT_AMBULATORY_CARE_PROVIDER_SITE_OTHER): Payer: 59 | Admitting: Family

## 2017-12-18 ENCOUNTER — Other Ambulatory Visit (INDEPENDENT_AMBULATORY_CARE_PROVIDER_SITE_OTHER): Payer: 59

## 2017-12-18 DIAGNOSIS — F419 Anxiety disorder, unspecified: Secondary | ICD-10-CM

## 2017-12-18 DIAGNOSIS — E876 Hypokalemia: Secondary | ICD-10-CM

## 2017-12-18 DIAGNOSIS — I10 Essential (primary) hypertension: Secondary | ICD-10-CM | POA: Diagnosis not present

## 2017-12-18 DIAGNOSIS — F329 Major depressive disorder, single episode, unspecified: Secondary | ICD-10-CM

## 2017-12-18 LAB — BASIC METABOLIC PANEL
BUN: 11 mg/dL (ref 6–23)
CHLORIDE: 105 meq/L (ref 96–112)
CO2: 26 mEq/L (ref 19–32)
Calcium: 9.1 mg/dL (ref 8.4–10.5)
Creatinine, Ser: 0.74 mg/dL (ref 0.40–1.20)
GFR: 92.56 mL/min (ref >60.00)
Glucose, Bld: 93 mg/dL (ref 70–99)
POTASSIUM: 3.7 meq/L (ref 3.5–5.1)
Sodium: 139 mEq/L (ref 135–145)

## 2017-12-18 MED ORDER — BUPROPION HCL ER (XL) 150 MG PO TB24: 150.0000 mg | ORAL_TABLET | Freq: Every day | ORAL | 1 refills | Status: DC

## 2017-12-18 NOTE — Progress Notes (Signed)
Jenny Parsons is a 42 y.o. female with the following history as recorded in EpicCare:  Patient Active Problem List   Diagnosis Date Noted  . Cervical paraspinal muscle spasm 05/20/2017  . Allergic rhinitis 02/02/2017  . Depression with anxiety 11/06/2016  . Snoring 11/05/2016  . Acute maxillary sinusitis 11/14/2015  . Essential hypertension, benign 12/22/2011  . Routine general medical examination at a health care facility 12/22/2011    Current Outpatient Medications  Medication Sig Dispense Refill  . escitalopram (LEXAPRO) 10 MG tablet Take 1 tablet (10 mg total) by mouth daily. 90 tablet 1  . losartan (COZAAR) 100 MG tablet TAKE 1 TABLET BY MOUTH ONCE DAILY 90 tablet 0  . buPROPion (WELLBUTRIN XL) 150 MG 24 hr tablet Take 1 tablet (150 mg total) by mouth daily. 30 tablet 1  . fluticasone (FLONASE) 50 MCG/ACT nasal spray Place 2 sprays into both nostrils daily. (Patient not taking: Reported on 12/18/2017) 16 g 6  . levocetirizine (XYZAL) 5 MG tablet Take 1 tablet (5 mg total) by mouth every evening. (Patient not taking: Reported on 12/18/2017) 90 tablet 3  . potassium chloride SA (K-DUR,KLOR-CON) 20 MEQ tablet Take 1 tablet (20 mEq total) by mouth 3 (three) times daily. (Patient not taking: Reported on 12/18/2017) 270 tablet 1   No current facility-administered medications for this visit.     Allergies: Ramipril  Past Medical History:  Diagnosis Date  . Hypertension     Past Surgical History:  Procedure Laterality Date  . TUBAL LIGATION      Family History  Problem Relation Age of Onset  . Hypertension Mother   . Cancer Neg Hx   . Diabetes Neg Hx   . Stroke Neg Hx     Social History   Tobacco Use  . Smoking status: Never Smoker  . Smokeless tobacco: Never Used  Substance Use Topics  . Alcohol use: No    Subjective:  Increased anxiety/ depression due to family issues- separated from husband of 20+ years; he is not helping support children; she is now having to  work second job to help pay bills; admits not sleeping well- tossing and turning; increased panic attacks recently; had one at work yesterday- had to go outside and cry for about 15 minutes; no energy to get up and do anything; not sleeping well- tossing and turning; feels has good support with friends/ doing some counseling with pastor;   Feels her blood pressure is elevated due to stress; has not been checking regularly; Denies any chest pain, shortness of breath, blurred vision or headache.   Objective:  Vitals:   12/18/17 0939  BP: (!) 142/98  Pulse: 86  Temp: 98.3 F (36.8 C)  TempSrc: Oral  SpO2: 98%  Weight: 208 lb (94.3 kg)  Height: 5\' 1"  (1.549 m)    General: Well developed, well nourished, in no acute distress  Skin : Warm and dry.  Head: Normocephalic and atraumatic  Eyes: Sclera and conjunctiva clear; pupils round and reactive to light; extraocular movements intact  Ears: External normal; canals clear; tympanic membranes normal  Oropharynx: Pink, supple. No suspicious lesions  Neck: Supple without thyromegaly, adenopathy  Lungs: Respirations unlabored; clear to auscultation bilaterally without wheeze, rales, rhonchi  CVS exam: normal rate and regular rhythm.  Neurologic: Alert and oriented; speech intact; face symmetrical; moves all extremities well; CNII-XII intact without focal deficit   Assessment:  1. Anxiety and depression   2. Essential hypertension, benign   3. Hypokalemia  Plan:  1. Increase Lexapro to 20 mg daily; try adding Wellbutrin XL 150 mg qd; continue working with pastor/ ask friends for support; try to take time for herself as able; follow-up with response in 2 weeks, sooner prn. 2. ? Control; ? Elevation due to anxiety/ depression; start checking daily and call back with readings in 2 weeks; may need to add second medicine. 3. Has not been taking potassium daily- not taking in 3-4 weeks; update BMP today;   Time spent with patient 30 minutes;  greater than 50% counseling with patient.   No follow-ups on file.  Orders Placed This Encounter  Procedures  . Basic Metabolic Panel (BMET)    Standing Status:   Future    Number of Occurrences:   1    Standing Expiration Date:   12/18/2018    Requested Prescriptions   Signed Prescriptions Disp Refills  . buPROPion (WELLBUTRIN XL) 150 MG 24 hr tablet 30 tablet 1    Sig: Take 1 tablet (150 mg total) by mouth daily.

## 2017-12-18 NOTE — Patient Instructions (Signed)
Check your blood pressure 3-4 x per week; e-mail in about 2 weeks with readings and response;

## 2017-12-21 ENCOUNTER — Ambulatory Visit
Admission: RE | Admit: 2017-12-21 | Discharge: 2017-12-21 | Disposition: A | Discharge status: Home / Self Care | Payer: 59 | Source: Ambulatory Visit | Attending: Obstetrics and Gynecology | Admitting: Obstetrics and Gynecology

## 2017-12-21 DIAGNOSIS — N644 Mastodynia: Secondary | ICD-10-CM

## 2017-12-21 DIAGNOSIS — R928 Other abnormal and inconclusive findings on diagnostic imaging of breast: Secondary | ICD-10-CM | POA: Diagnosis not present

## 2017-12-21 DIAGNOSIS — N6489 Other specified disorders of breast: Secondary | ICD-10-CM | POA: Diagnosis not present

## 2017-12-22 ENCOUNTER — Ambulatory Visit: Payer: 59 | Admitting: Internal Medicine

## 2017-12-23 ENCOUNTER — Encounter: Payer: Self-pay | Admitting: Family

## 2017-12-31 ENCOUNTER — Ambulatory Visit (INDEPENDENT_AMBULATORY_CARE_PROVIDER_SITE_OTHER): Payer: 59 | Admitting: Family Medicine

## 2017-12-31 ENCOUNTER — Ambulatory Visit: Payer: Self-pay | Admitting: *Deleted

## 2017-12-31 ENCOUNTER — Encounter: Payer: Self-pay | Admitting: Family Medicine

## 2017-12-31 DIAGNOSIS — I10 Essential (primary) hypertension: Secondary | ICD-10-CM

## 2017-12-31 MED ORDER — AMLODIPINE BESYLATE 5 MG PO TABS: 5.0000 mg | ORAL_TABLET | Freq: Every day | ORAL | 1 refills | Status: DC

## 2017-12-31 NOTE — Telephone Encounter (Signed)
Pt reports B/P last evening 159/103. Pt states not able to check it today as she is at work. Pt works at [employer]; encouraged her to find staff who could check pressure for her during NT call; states unable to do so. Pt seen 12/18/17 by L. Murray for anxiety, depression.  B/P at OV 142/98. Pt on losartan 100mg ;  denies missing any doses. Reports 9/10 headache this am; presently 4/10 after taking Ibuprofen.  Pt also reports vision "slightly blurry." Denies any CP,SOB. Same day appt made with Dr. Jordan LikesSchmitz. Care advise given per protocol.  Instructed pt to try to get B/P checked at work ([employer]); go to ED if symptoms worsen.   Reason for Disposition . Systolic BP  >= 160 OR Diastolic >= 100  Answer Assessment - Initial Assessment Questions 1. BLOOD PRESSURE: "What is the blood pressure?" "Did you take at least two measurements 5 minutes apart?"     Checked last night; 159/103 2. ONSET: "When did you take your blood pressure?"     Last night, unable to check today 3. HOW: "How did you obtain the blood pressure?" (e.g., visiting nurse, automatic home BP monitor)     At drugstore 4. HISTORY: "Do you have a history of high blood pressure?"     yes 5. MEDICATIONS: "Are you taking any medications for blood pressure?" "Have you missed any doses recently?"    Yes, no missed doses 6. OTHER SYMPTOMS: "Do you have any symptoms?" (e.g., headache, chest pain, blurred vision, difficulty breathing, weakness)     Slightly blurry, headache 4/10 now after 4 ibuprofen ; prior this am 9/10 7. PREGNANCY: "Is there any chance you are pregnant?" "When was your last menstrual period?"     no  Protocols used: HIGH BLOOD PRESSURE-A-AH

## 2017-12-31 NOTE — Progress Notes (Signed)
Jenny Parsons - 42 y.o. female MRN [id number]  Date of birth: [birth year]  SUBJECTIVE:  Including CC & ROS.  Chief Complaint  Patient presents with  . Hypertension    Jenny Parsons is a 42 y.o. female that is presenting with HTN. Reports her blood pressure has steadily increased recently. She has been taking her losartan. Has been under more stress than she usually is. Does have a headache. Denies any chest pain or leg swelling.     Review of Systems  Constitutional: Negative for fever.  HENT: Negative for congestion.   Musculoskeletal: Negative for gait problem.  Skin: Negative for color change.  Neurological: Positive for headaches.    HISTORY: Past Medical, Surgical, Social, and Family History Reviewed & Updated per EMR.   Pertinent Historical Findings include:  Past Medical History:  Diagnosis Date  . Hypertension     Past Surgical History:  Procedure Laterality Date  . TUBAL LIGATION      Allergies  Allergen Reactions  . Ramipril Cough    Family History  Problem Relation Age of Onset  . Hypertension Mother   . Breast cancer Maternal Grandmother   . Cancer Neg Hx   . Diabetes Neg Hx   . Stroke Neg Hx      Social History   Socioeconomic History  . Marital status: Married    Spouse name: Not on file  . Number of children: Not on file  . Years of education: Not on file  . Highest education level: Not on file  Occupational History  . Not on file  Social Needs  . Financial resource strain: Not on file  . Food insecurity:    Worry: Not on file    Inability: Not on file  . Transportation needs:    Medical: Not on file    Non-medical: Not on file  Tobacco Use  . Smoking status: Never Smoker  . Smokeless tobacco: Never Used  Substance and Sexual Activity  . Alcohol use: No  . Drug use: No  . Sexual activity: Yes    Birth control/protection: Surgical  Lifestyle  . Physical activity:    Days per week: Not on file    Minutes per  session: Not on file  . Stress: Not on file  Relationships  . Social connections:    Talks on phone: Not on file    Gets together: Not on file    Attends religious service: Not on file    Active member of club or organization: Not on file    Attends meetings of clubs or organizations: Not on file    Relationship status: Not on file  . Intimate partner violence:    Fear of current or ex partner: Not on file    Emotionally abused: Not on file    Physically abused: Not on file    Forced sexual activity: Not on file  Other Topics Concern  . Not on file  Social History Narrative  . Not on file     PHYSICAL EXAM:  VS: BP (!) 150/100 (BP Location: Left Arm, Patient Position: Sitting, Cuff Size: Large)   Pulse 73   Temp 98.1 F (36.7 C) (Oral)   Ht 5\' 1"  (1.549 m)   Wt 206 lb 8 oz (93.7 kg)   SpO2 99%   BMI 39.02 kg/m  Physical Exam Gen: NAD, alert, cooperative with exam, well-appearing ENT: normal lips, normal nasal mucosa Eye: normal EOM, normal conjunctiva and lids CV:  no edema, +  2 pedal pulses, regular rate and rhythm, S1-S2   Resp: no accessory muscle use, non-labored, clear to auscultation bilaterally, no crackles or wheezes Skin: no rashes, no areas of induration  Neuro: normal tone, normal sensation to touch Psych:  normal insight, alert and oriented MSK: Normal gait, normal strength       ASSESSMENT & PLAN:   Essential hypertension, benign Uncontrolled today. Has been under more stress than she normally is. - added amlodipine - advised to follow-up in 2-3 weeks to monitor her blood pressure - counseled on how to monitor her blood pressure on a daily basis.

## 2017-12-31 NOTE — Patient Instructions (Signed)
Please follow up if your blood pressure continues to rise

## 2018-01-01 NOTE — Assessment & Plan Note (Signed)
Uncontrolled today. Has been under more stress than she normally is. - added amlodipine - advised to follow-up in 2-3 weeks to monitor her blood pressure - counseled on how to monitor her blood pressure on a daily basis.

## 2018-01-15 ENCOUNTER — Other Ambulatory Visit: Payer: Self-pay | Admitting: Internal Medicine

## 2018-01-15 DIAGNOSIS — F418 Other specified anxiety disorders: Secondary | ICD-10-CM

## 2018-01-19 ENCOUNTER — Other Ambulatory Visit: Payer: Self-pay | Admitting: Internal Medicine

## 2018-01-19 ENCOUNTER — Telehealth: Payer: Self-pay | Admitting: Internal Medicine

## 2018-01-19 DIAGNOSIS — F418 Other specified anxiety disorders: Secondary | ICD-10-CM

## 2018-01-19 MED ORDER — ESCITALOPRAM OXALATE 20 MG PO TABS: 20.0000 mg | ORAL_TABLET | Freq: Every day | ORAL | 1 refills | Status: DC

## 2018-01-19 NOTE — Telephone Encounter (Signed)
Do you want send rx for Lexapro 20 mg instead of the 10 mg.  Last rx was for the 10 mg and #90 was sent (45 day supply). Please advise.

## 2018-01-19 NOTE — Telephone Encounter (Signed)
New RX sent for the 20 mg dose

## 2018-01-19 NOTE — Telephone Encounter (Signed)
Copied from CRM [id number]. Topic: Quick Communication - See Telephone Encounter >> Jan 19, 2018  1:59 PM Rudi Coco, NT wrote: CRM for notification. See Telephone encounter for: 01/19/18.  [place] [facility] calling to see if a new rx. Can be written for 90 day supply instead of 45 day for med. escitalopram (LEXAPRO) 10 MG tablet [[id number]]   [facility] - [place], [place] - [address] [place] [address] [place] [address] Phone: [phone] Fax: [phone]

## 2018-01-20 NOTE — Telephone Encounter (Signed)
LVM for pt to call back as soon as possible.  RE: MD increased dose of lexapro to  daily instead of  bid.

## 2018-01-20 NOTE — Telephone Encounter (Signed)
Patient called back, She has been informed of the note below.

## 2018-01-26 ENCOUNTER — Encounter: Payer: Self-pay | Admitting: Family

## 2018-01-26 ENCOUNTER — Ambulatory Visit (INDEPENDENT_AMBULATORY_CARE_PROVIDER_SITE_OTHER): Payer: 59 | Admitting: Family

## 2018-01-26 VITALS — BP 128/90 | HR 68 | Temp 98.1°F | Ht 61.0 in | Wt 208.0 lb

## 2018-01-26 DIAGNOSIS — I10 Essential (primary) hypertension: Secondary | ICD-10-CM

## 2018-01-26 MED ORDER — LOSARTAN POTASSIUM-HCTZ 100-25 MG PO TABS: 1.0000 | ORAL_TABLET | Freq: Every day | ORAL | 0 refills | Status: DC

## 2018-01-26 NOTE — Progress Notes (Signed)
Jenny Parsons is a 42 y.o. female with the following history as recorded in EpicCare:  Patient Active Problem List   Diagnosis Date Noted  . Cervical paraspinal muscle spasm 05/20/2017  . Allergic rhinitis 02/02/2017  . Depression with anxiety 11/06/2016  . Snoring 11/05/2016  . Essential hypertension, benign 12/22/2011  . Routine general medical examination at a health care facility 12/22/2011    Current Outpatient Medications  Medication Sig Dispense Refill  . amLODipine (NORVASC) 5 MG tablet Take 1 tablet (5 mg total) by mouth daily. 30 tablet 1  . buPROPion (WELLBUTRIN XL) 150 MG 24 hr tablet Take 1 tablet (150 mg total) by mouth daily. 30 tablet 1  . escitalopram (LEXAPRO) 20 MG tablet Take 1 tablet (20 mg total) by mouth daily. 90 tablet 1  . levocetirizine (XYZAL) 5 MG tablet Take 1 tablet (5 mg total) by mouth every evening. 90 tablet 3  . fluticasone (FLONASE) 50 MCG/ACT nasal spray Place 2 sprays into both nostrils daily. (Patient not taking: Reported on 01/26/2018) 16 g 6  . losartan-hydrochlorothiazide (HYZAAR) 100-25 MG tablet Take 1 tablet by mouth daily. 90 tablet 0   No current facility-administered medications for this visit.     Allergies: Ramipril  Past Medical History:  Diagnosis Date  . Hypertension     Past Surgical History:  Procedure Laterality Date  . TUBAL LIGATION      Family History  Problem Relation Age of Onset  . Hypertension Mother   . Breast cancer Maternal Grandmother   . Cancer Neg Hx   . Diabetes Neg Hx   . Stroke Neg Hx     Social History   Tobacco Use  . Smoking status: Never Smoker  . Smokeless tobacco: Never Used  Substance Use Topics  . Alcohol use: No    Subjective:  Patient presents with concerns for continued concerns regarding uncontrolled blood pressure/ headaches/ blurred vision; was seen in early April with similar concerns and Amlodipine 5 mg was added to Losartan 100 mg; she has noticed very little improvement in  her symptoms; states her blood pressure has been averaging 137/100 and 143/103; checking her blood pressure at hospital; Denies any chest pain, shortness of breath, blurred vision; admits that her stress level continues to be very high- feels that her second job is contributing to her stress/ headaches; notes that her neck stays "tight." Wonders if she could get a work note to take her out of work for the next week from her second job; does feel that anxiety/ depression are doing better since starting Wellbutrin with her Lexapro; is scheduled to see her eye doctor later this week for eye exam;    Objective:  Vitals:   01/26/18 0950  BP: 128/90  Pulse: 68  Temp: 98.1 F (36.7 C)  TempSrc: Oral  SpO2: 99%  Weight: 208 lb (94.3 kg)  Height:  (1.549 m)    General: Well developed, well nourished, in no acute distress  Skin : Warm and dry.  Head: Normocephalic and atraumatic  Eyes: Sclera and conjunctiva clear; pupils round and reactive to light; extraocular movements intact  Ears: External normal; canals clear; tympanic membranes normal  Oropharynx: Pink, supple. No suspicious lesions  Neck: Supple without thyromegaly, adenopathy  Lungs: Respirations unlabored; clear to auscultation bilaterally without wheeze, rales, rhonchi  CVS exam: normal rate and regular rhythm.  Neurologic: Alert and oriented; speech intact; face symmetrical; moves all extremities well; CNII-XII intact without focal deficit   Assessment:  1. Essential hypertension, benign     Plan:  Uncontrolled; suspect stress is affecting her blood pressure as well; update EKG today- NSR; BMP was checked at the end of March; will try changing to Losartan HCT 100/25 and continue Amlodipine 5 mg daily; keep planned eye appointment for Friday; she will call back after seeing her eye doctor and report how blood pressure has responded; work note given as requested- will see how she feels with stress reduction/ to also consider  increased dose of Wellbutrin XL; follow-up in 2 weeks, sooner prn- if headaches persisting, will need to get CT.   Return in about 2 weeks (around 02/09/2018) for blood pressure follow-up with me.  Orders Placed This Encounter  Procedures  . EKG 12-Lead    Requested Prescriptions   Signed Prescriptions Disp Refills  . losartan-hydrochlorothiazide (HYZAAR) 100-25 MG tablet 90 tablet 0    Sig: Take 1 tablet by mouth daily.

## 2018-01-29 DIAGNOSIS — H5213 Myopia, bilateral: Secondary | ICD-10-CM | POA: Diagnosis not present

## 2018-01-29 DIAGNOSIS — H52223 Regular astigmatism, bilateral: Secondary | ICD-10-CM | POA: Diagnosis not present

## 2018-02-01 ENCOUNTER — Encounter: Payer: Self-pay | Admitting: Family

## 2018-02-01 NOTE — Telephone Encounter (Signed)
FYI

## 2018-02-05 DIAGNOSIS — H16223 Keratoconjunctivitis sicca, not specified as Sjogren's, bilateral: Secondary | ICD-10-CM | POA: Diagnosis not present

## 2018-02-05 DIAGNOSIS — H04123 Dry eye syndrome of bilateral lacrimal glands: Secondary | ICD-10-CM | POA: Diagnosis not present

## 2018-02-10 ENCOUNTER — Ambulatory Visit: Payer: 59 | Admitting: Family

## 2018-02-11 ENCOUNTER — Other Ambulatory Visit: Payer: Self-pay | Admitting: Family

## 2018-02-12 ENCOUNTER — Encounter: Payer: Self-pay | Admitting: Family

## 2018-02-12 ENCOUNTER — Ambulatory Visit (INDEPENDENT_AMBULATORY_CARE_PROVIDER_SITE_OTHER): Payer: 59 | Admitting: Family

## 2018-02-12 VITALS — BP 122/82 | HR 77 | Temp 98.2°F | Ht 61.0 in | Wt 207.0 lb

## 2018-02-12 DIAGNOSIS — I10 Essential (primary) hypertension: Secondary | ICD-10-CM | POA: Diagnosis not present

## 2018-02-12 MED ORDER — AMLODIPINE BESYLATE 5 MG PO TABS: 5.0000 mg | ORAL_TABLET | Freq: Every day | ORAL | 1 refills | Status: DC

## 2018-02-12 MED ORDER — BUPROPION HCL ER (XL) 300 MG PO TB24: 300.0000 mg | ORAL_TABLET | Freq: Every day | ORAL | 1 refills | Status: DC

## 2018-02-12 NOTE — Progress Notes (Signed)
  Jenny Parsons is a 42 y.o. female with the following history as recorded in EpicCare:  Patient Active Problem List   Diagnosis Date Noted  . Cervical paraspinal muscle spasm 05/20/2017  . Allergic rhinitis 02/02/2017  . Depression with anxiety 11/06/2016  . Snoring 11/05/2016  . Essential hypertension, benign 12/22/2011  . Routine general medical examination at a health care facility 12/22/2011    Current Outpatient Medications  Medication Sig Dispense Refill  . amLODipine (NORVASC) 5 MG tablet Take 1 tablet (5 mg total) by mouth daily. 90 tablet 1  . buPROPion (WELLBUTRIN XL) 300 MG 24 hr tablet Take 1 tablet (300 mg total) by mouth daily. 90 tablet 1  . escitalopram (LEXAPRO) 20 MG tablet Take 1 tablet (20 mg total) by mouth daily. 90 tablet 1  . fluticasone (FLONASE) 50 MCG/ACT nasal spray Place 2 sprays into both nostrils daily. 16 g 6  . levocetirizine (XYZAL) 5 MG tablet Take 1 tablet (5 mg total) by mouth every evening. 90 tablet 3  . losartan-hydrochlorothiazide (HYZAAR) 100-25 MG tablet Take 1 tablet by mouth daily. 90 tablet 0   No current facility-administered medications for this visit.     Allergies: Ramipril  Past Medical History:  Diagnosis Date  . Hypertension     Past Surgical History:  Procedure Laterality Date  . TUBAL LIGATION      Family History  Problem Relation Age of Onset  . Hypertension Mother   . Breast cancer Maternal Grandmother   . Cancer Neg Hx   . Diabetes Neg Hx   . Stroke Neg Hx     Social History   Tobacco Use  . Smoking status: Never Smoker  . Smokeless tobacco: Never Used  Substance Use Topics  . Alcohol use: No    Subjective:  Patient presents for follow-up on her hypertension/ increased dose of Wellbutrin; has been feeling very good with both changes; increased energy for the most part; headaches and blurred vision have stopped; Denies any chest pain, shortness of breath, blurred vision or headache.   Objective:   Vitals:   02/12/18 0927  BP: 122/82  Pulse: 77  Temp: 98.2 F (36.8 C)  TempSrc: Oral  SpO2: 96%  Weight: 207 lb 0.6 oz (93.9 kg)  Height:  (1.549 m)    General: Well developed, well nourished, in no acute distress  Skin : Warm and dry.  Head: Normocephalic and atraumatic  Lungs: Respirations unlabored; clear to auscultation bilaterally without wheeze, rales, rhonchi  CVS exam: normal rate and regular rhythm.  Neurologic: Alert and oriented; speech intact; face symmetrical; moves all extremities well; CNII-XII intact without focal deficit   Assessment:  1. Essential hypertension, benign     Plan:  Improved/ controlled; patient feeling very good; continue same medications; follow-up in about 4 months, sooner prn.   Return in about 4 months (around 06/15/2018).  No orders of the defined types were placed in this encounter.   Requested Prescriptions   Signed Prescriptions Disp Refills  . buPROPion (WELLBUTRIN XL) 300 MG 24 hr tablet 90 tablet 1    Sig: Take 1 tablet (300 mg total) by mouth daily.  Marland Kitchen amLODipine (NORVASC) 5 MG tablet 90 tablet 1    Sig: Take 1 tablet (5 mg total) by mouth daily.

## 2018-02-17 DIAGNOSIS — M170 Bilateral primary osteoarthritis of knee: Secondary | ICD-10-CM | POA: Diagnosis not present

## 2018-02-23 ENCOUNTER — Other Ambulatory Visit: Payer: Self-pay | Admitting: Family

## 2018-02-23 ENCOUNTER — Encounter: Payer: Self-pay | Admitting: Family

## 2018-02-23 ENCOUNTER — Telehealth: Payer: 59 | Admitting: Family

## 2018-02-23 DIAGNOSIS — J302 Other seasonal allergic rhinitis: Secondary | ICD-10-CM | POA: Diagnosis not present

## 2018-02-23 MED ORDER — AMOXICILLIN-POT CLAVULANATE 875-125 MG PO TABS: 1.0000 | ORAL_TABLET | Freq: Two times a day (BID) | ORAL | 0 refills | Status: DC

## 2018-02-23 MED ORDER — FLUTICASONE PROPIONATE 50 MCG/ACT NA SUSP
2.0000 | Freq: Every day | NASAL | 6 refills | Status: DC
Start: 2018-02-23 — End: 2018-05-18

## 2018-02-23 NOTE — Progress Notes (Signed)

## 2018-03-17 DIAGNOSIS — M170 Bilateral primary osteoarthritis of knee: Secondary | ICD-10-CM | POA: Diagnosis not present

## 2018-03-31 DIAGNOSIS — M170 Bilateral primary osteoarthritis of knee: Secondary | ICD-10-CM | POA: Diagnosis not present

## 2018-04-14 DIAGNOSIS — M170 Bilateral primary osteoarthritis of knee: Secondary | ICD-10-CM | POA: Diagnosis not present

## 2018-05-12 ENCOUNTER — Other Ambulatory Visit: Payer: Self-pay | Admitting: Family

## 2018-05-12 ENCOUNTER — Other Ambulatory Visit: Payer: Self-pay | Admitting: Internal Medicine

## 2018-05-18 ENCOUNTER — Other Ambulatory Visit (INDEPENDENT_AMBULATORY_CARE_PROVIDER_SITE_OTHER): Payer: 59

## 2018-05-18 ENCOUNTER — Ambulatory Visit (INDEPENDENT_AMBULATORY_CARE_PROVIDER_SITE_OTHER): Payer: 59 | Admitting: Family

## 2018-05-18 ENCOUNTER — Encounter: Payer: Self-pay | Admitting: Family

## 2018-05-18 VITALS — BP 126/82 | HR 85 | Temp 98.1°F | Ht 61.0 in | Wt 217.1 lb

## 2018-05-18 DIAGNOSIS — E010 Iodine-deficiency related diffuse (endemic) goiter: Secondary | ICD-10-CM | POA: Diagnosis not present

## 2018-05-18 DIAGNOSIS — F418 Other specified anxiety disorders: Secondary | ICD-10-CM | POA: Diagnosis not present

## 2018-05-18 DIAGNOSIS — J029 Acute pharyngitis, unspecified: Secondary | ICD-10-CM

## 2018-05-18 DIAGNOSIS — I10 Essential (primary) hypertension: Secondary | ICD-10-CM | POA: Diagnosis not present

## 2018-05-18 LAB — COMPREHENSIVE METABOLIC PANEL
ALK PHOS: 97 U/L (ref 39–117)
ALT: 11 U/L (ref 0–35)
AST: 11 U/L (ref 0–37)
Albumin: 4 g/dL (ref 3.5–5.2)
BUN: 19 mg/dL (ref 6–23)
CHLORIDE: 103 meq/L (ref 96–112)
CO2: 30 mEq/L (ref 19–32)
Calcium: 9.6 mg/dL (ref 8.4–10.5)
Creatinine, Ser: 0.8 mg/dL (ref 0.40–1.20)
GFR: 84.42 mL/min (ref >60.00)
GLUCOSE: 85 mg/dL (ref 70–99)
POTASSIUM: 4 meq/L (ref 3.5–5.1)
Sodium: 138 mEq/L (ref 135–145)
TOTAL PROTEIN: 6.8 g/dL (ref 6.0–8.3)
Total Bilirubin: 0.5 mg/dL (ref 0.2–1.2)

## 2018-05-18 LAB — TSH: TSH: 2.19 u[IU]/mL (ref 0.35–4.50)

## 2018-05-18 MED ORDER — FLUCONAZOLE 150 MG PO TABS: ORAL_TABLET | ORAL | 1 refills | Status: DC

## 2018-05-18 MED ORDER — AMLODIPINE BESYLATE 5 MG PO TABS: 5.0000 mg | ORAL_TABLET | Freq: Every day | ORAL | 1 refills | Status: DC

## 2018-05-18 MED ORDER — BUPROPION HCL ER (XL) 300 MG PO TB24: 300.0000 mg | ORAL_TABLET | Freq: Every day | ORAL | 1 refills | Status: DC

## 2018-05-18 MED ORDER — PANTOPRAZOLE SODIUM 40 MG PO TBEC: 40.0000 mg | DELAYED_RELEASE_TABLET | Freq: Two times a day (BID) | ORAL | 0 refills | Status: DC

## 2018-05-18 MED ORDER — FLUTICASONE PROPIONATE 50 MCG/ACT NA SUSP: 2.0000 | Freq: Every day | NASAL | 6 refills | Status: DC

## 2018-05-18 MED ORDER — LOSARTAN POTASSIUM-HCTZ 100-25 MG PO TABS: 1.0000 | ORAL_TABLET | Freq: Every day | ORAL | 1 refills | Status: DC

## 2018-05-18 MED ORDER — ESCITALOPRAM OXALATE 20 MG PO TABS: 20.0000 mg | ORAL_TABLET | Freq: Every day | ORAL | 1 refills | Status: DC

## 2018-05-18 NOTE — Progress Notes (Signed)
Jenny Parsons is a 42 y.o. female with the following history as recorded in EpicCare:  Patient Active Problem List   Diagnosis Date Noted  . Cervical paraspinal muscle spasm 05/20/2017  . Allergic rhinitis 02/02/2017  . Depression with anxiety 11/06/2016  . Snoring 11/05/2016  . Essential hypertension, benign 12/22/2011  . Routine general medical examination at a health care facility 12/22/2011    Current Outpatient Medications  Medication Sig Dispense Refill  . amLODipine (NORVASC) 5 MG tablet Take 1 tablet (5 mg total) by mouth daily. 90 tablet 1  . buPROPion (WELLBUTRIN XL) 300 MG 24 hr tablet Take 1 tablet (300 mg total) by mouth daily. 90 tablet 1  . escitalopram (LEXAPRO) 20 MG tablet Take 1 tablet (20 mg total) by mouth daily. 90 tablet 1  . fluticasone (FLONASE) 50 MCG/ACT nasal spray Place 2 sprays into both nostrils daily. 16 g 6  . levocetirizine (XYZAL) 5 MG tablet Take 1 tablet (5 mg total) by mouth every evening. 90 tablet 3  . losartan-hydrochlorothiazide (HYZAAR) 100-25 MG tablet Take 1 tablet by mouth daily. 90 tablet 1  . nabumetone (RELAFEN) 500 MG tablet   1  . fluconazole (DIFLUCAN) 150 MG tablet Take 1 tablet as directed; take second dose 72 hours later as directed 6 tablet 1  . pantoprazole (PROTONIX) 40 MG tablet Take 1 tablet (40 mg total) by mouth 2 (two) times daily. 180 tablet 0   No current facility-administered medications for this visit.     Allergies: Ramipril  Past Medical History:  Diagnosis Date  . Hypertension     Past Surgical History:  Procedure Laterality Date  . TUBAL LIGATION      Family History  Problem Relation Age of Onset  . Hypertension Mother   . Breast cancer Maternal Grandmother   . Cancer Neg Hx   . Diabetes Neg Hx   . Stroke Neg Hx     Social History   Tobacco Use  . Smoking status: Never Smoker  . Smokeless tobacco: Never Used  Substance Use Topics  . Alcohol use: No    Subjective:  Patient presents today  with concerns for sore throat x 4-5 days; notes that feels like her neck is "swollen"; does not feel sick/ notes that symptoms feel different than would be caused by illness/ post-nasal drainage; increased reflux symptoms recently- "eating Tums" x 2 weeks; stress level has been increased recently- divorce finalized/ daughter left for college; no sensation that food getting stuck/ no difficulty swallowing/ no lips swelling; no fever or sinus pain/ pressure;  Is pleased with response to blood pressure regimen and combination of Lexapro/ Wellbutrin; also requesting updated Rx for Diflucan to use as needed; prescription expired and needs to get updated; Denies any chest pain, shortness of breath, blurred vision or headache.     Objective:  Vitals:   05/18/18 1003  BP: 126/82  Pulse: 85  Temp: 98.1 F (36.7 C)  TempSrc: Oral  SpO2: 97%  Weight: 217 lb 1.9 oz (98.5 kg)  Height: '5\' 1"'$  (1.549 m)    General: Well developed, well nourished, in no acute distress  Skin : Warm and dry.  Head: Normocephalic and atraumatic  Eyes: Sclera and conjunctiva clear; pupils round and reactive to light; extraocular movements intact  Ears: External normal; canals clear; tympanic membranes normal  Oropharynx: Pink, supple. No suspicious lesions  Neck: Supple without thyromegaly, adenopathy  Lungs: Respirations unlabored; clear to auscultation bilaterally without wheeze, rales, rhonchi  CVS exam:  normal rate and regular rhythm.  Abdomen: Soft; nontender; nondistended; normoactive bowel sounds; no masses or hepatosplenomegaly  Musculoskeletal: No deformities; no active joint inflammation  Extremities: No edema, cyanosis, clubbing  Vessels: Symmetric bilaterally  Neurologic: Alert and oriented; speech intact; face symmetrical; moves all extremities well; CNII-XII intact without focal deficit  Assessment:  1. Sore throat   2. Thyromegaly   3. Essential hypertension, benign   4. Depression with anxiety      Plan:  1. ? Related to GERD; trial of Protonix 40 mg bid; follow-up to be determined; 2. Update thyroid ultrasound; check TSH today; 3. Stable; refills updated; Check CMP today; 4. Stable; refills updated;     No follow-ups on file.  Orders Placed This Encounter  Procedures  . US THYROID    Standing Status:   Future    Standing Expiration Date:   07/19/2019    Order Specific Question:   Reason for Exam (SYMPTOM  OR DIAGNOSIS REQUIRED)    Answer:   enlarged thyroid gland    Order Specific Question:   Preferred imaging location?    Answer:   [facility]  . Comp Met (CMET)    Standing Status:   Future    Number of Occurrences:   1    Standing Expiration Date:   05/18/2019  . TSH    Standing Status:   Future    Number of Occurrences:   1    Standing Expiration Date:   05/18/2019    Requested Prescriptions   Signed Prescriptions Disp Refills  . pantoprazole (PROTONIX) 40 MG tablet 180 tablet 0    Sig: Take 1 tablet (40 mg total) by mouth 2 (two) times daily.  Marland Kitchen amLODipine (NORVASC) 5 MG tablet 90 tablet 1    Sig: Take 1 tablet (5 mg total) by mouth daily.  Marland Kitchen buPROPion (WELLBUTRIN XL) 300 MG 24 hr tablet 90 tablet 1    Sig: Take 1 tablet (300 mg total) by mouth daily.  Marland Kitchen escitalopram (LEXAPRO) 20 MG tablet 90 tablet 1    Sig: Take 1 tablet (20 mg total) by mouth daily.  Marland Kitchen losartan-hydrochlorothiazide (HYZAAR) 100-25 MG tablet 90 tablet 1    Sig: Take 1 tablet by mouth daily.  . fluticasone (FLONASE) 50 MCG/ACT nasal spray 16 g 6    Sig: Place 2 sprays into both nostrils daily.  . fluconazole (DIFLUCAN) 150 MG tablet 6 tablet 1    Sig: Take 1 tablet as directed; take second dose 72 hours later as directed

## 2018-06-01 ENCOUNTER — Encounter: Payer: Self-pay | Admitting: Family

## 2018-06-15 ENCOUNTER — Ambulatory Visit: Payer: 59 | Admitting: Family

## 2018-06-16 ENCOUNTER — Encounter: Payer: Self-pay | Admitting: Family

## 2018-06-16 ENCOUNTER — Other Ambulatory Visit: Payer: Self-pay | Admitting: Family

## 2018-06-16 MED ORDER — AMOXICILLIN-POT CLAVULANATE 875-125 MG PO TABS: 1.0000 | ORAL_TABLET | Freq: Two times a day (BID) | ORAL | 0 refills | Status: DC

## 2018-06-22 ENCOUNTER — Other Ambulatory Visit: Payer: Self-pay | Admitting: Family

## 2018-06-22 ENCOUNTER — Encounter: Payer: Self-pay | Admitting: Family

## 2018-06-22 MED ORDER — BETAMETHASONE DIPROPIONATE 0.05 % EX CREA: TOPICAL_CREAM | Freq: Two times a day (BID) | CUTANEOUS | 0 refills | Status: DC

## 2018-06-24 ENCOUNTER — Telehealth: Payer: Self-pay | Admitting: Internal Medicine

## 2018-06-24 NOTE — Telephone Encounter (Signed)
Copied from CRM [id number]. Topic: Quick Communication - Rx Refill/Question >> Jun 24, 2018  1:46 PM Alexander Bergeron B wrote: Medication: betamethasone dipropionate (DIPROLENE) 0.05 % cream [[id number]]   Medication above is too expensive; if okay pharmacy would Triamcinolone to be filled; contact to advise  Preferred Pharmacy (with phone number or street name): [facility]  Agent: Please be advised that RX refills may take up to 3 business days. We ask that you follow-up with your pharmacy.

## 2018-06-25 NOTE — Telephone Encounter (Signed)
Pharmacy is calling back to check on the status of this. Please advise.

## 2018-06-28 ENCOUNTER — Other Ambulatory Visit: Payer: Self-pay | Admitting: Family

## 2018-06-28 ENCOUNTER — Telehealth: Payer: Self-pay | Admitting: Internal Medicine

## 2018-06-28 MED ORDER — TRIAMCINOLONE ACETONIDE 0.1 % EX CREA: 1.0000 "application " | TOPICAL_CREAM | Freq: Two times a day (BID) | CUTANEOUS | 0 refills | Status: DC

## 2018-06-28 NOTE — Telephone Encounter (Signed)
Message sent to patient via mychart

## 2018-06-28 NOTE — Telephone Encounter (Signed)
Copied from CRM [id number]. Topic: Quick Communication - Rx Refill/Question >> Jun 28, 2018  8:42 AM Baldo Daub L wrote: Medication:  Betamethasone  Pharmacist calling.  States that medication is too expensive and they need to know an alternative. Bard Herbert can be reached at [phone]

## 2018-06-28 NOTE — Telephone Encounter (Signed)
This was sent in on 06/22/2018 by Ria Clock.

## 2018-06-28 NOTE — Telephone Encounter (Signed)
Triamcinolone cream is available if appropriate to change. (this was sent in to treat poison ivy pt stated in a mychart message). Please advise?

## 2018-06-28 NOTE — Telephone Encounter (Signed)
I sent Triamcinolone cream in as requested.

## 2018-06-30 DIAGNOSIS — M25561 Pain in right knee: Secondary | ICD-10-CM | POA: Diagnosis not present

## 2018-09-29 ENCOUNTER — Encounter: Payer: Self-pay | Admitting: Family

## 2018-09-29 ENCOUNTER — Ambulatory Visit (INDEPENDENT_AMBULATORY_CARE_PROVIDER_SITE_OTHER): Payer: No Typology Code available for payment source | Admitting: Family

## 2018-09-29 ENCOUNTER — Other Ambulatory Visit: Payer: Self-pay | Admitting: Family

## 2018-09-29 ENCOUNTER — Other Ambulatory Visit (INDEPENDENT_AMBULATORY_CARE_PROVIDER_SITE_OTHER): Payer: No Typology Code available for payment source

## 2018-09-29 VITALS — BP 128/68 | HR 73 | Temp 97.9°F | Ht 61.0 in | Wt 226.1 lb

## 2018-09-29 DIAGNOSIS — R635 Abnormal weight gain: Secondary | ICD-10-CM

## 2018-09-29 DIAGNOSIS — R5383 Other fatigue: Secondary | ICD-10-CM | POA: Diagnosis not present

## 2018-09-29 DIAGNOSIS — F418 Other specified anxiety disorders: Secondary | ICD-10-CM

## 2018-09-29 LAB — COMPREHENSIVE METABOLIC PANEL
ALK PHOS: 80 U/L (ref 39–117)
ALT: 11 U/L (ref 0–35)
AST: 13 U/L (ref 0–37)
Albumin: 3.9 g/dL (ref 3.5–5.2)
BILIRUBIN TOTAL: 0.5 mg/dL (ref 0.2–1.2)
BUN: 18 mg/dL (ref 6–23)
CALCIUM: 9.1 mg/dL (ref 8.4–10.5)
CO2: 29 meq/L (ref 19–32)
Chloride: 105 mEq/L (ref 96–112)
Creatinine, Ser: 0.67 mg/dL (ref 0.40–1.20)
GFR: 103.4 mL/min (ref >60.00)
Glucose, Bld: 76 mg/dL (ref 70–99)
POTASSIUM: 4.3 meq/L (ref 3.5–5.1)
Sodium: 139 mEq/L (ref 135–145)
Total Protein: 6.6 g/dL (ref 6.0–8.3)

## 2018-09-29 LAB — CBC WITH DIFFERENTIAL/PLATELET
BASOS ABS: 0 10*3/uL (ref 0.0–0.1)
Basophils Relative: 0.5 % (ref 0.0–3.0)
EOS PCT: 2.3 % (ref 0.0–5.0)
Eosinophils Absolute: 0.2 10*3/uL (ref 0.0–0.7)
HEMATOCRIT: 37 % (ref 36.0–46.0)
Hemoglobin: 12.7 g/dL (ref 12.0–15.0)
LYMPHS PCT: 25.7 % (ref 12.0–46.0)
Lymphs Abs: 1.9 10*3/uL (ref 0.7–4.0)
MCHC: 34.5 g/dL (ref 30.0–36.0)
MCV: 85.5 fl (ref 78.0–100.0)
MONOS PCT: 6.4 % (ref 3.0–12.0)
Monocytes Absolute: 0.5 10*3/uL (ref 0.1–1.0)
Neutro Abs: 4.7 10*3/uL (ref 1.4–7.7)
Neutrophils Relative %: 65.1 % (ref 43.0–77.0)
Platelets: 219 10*3/uL (ref 150.0–400.0)
RBC: 4.33 Mil/uL (ref 3.87–5.11)
RDW: 12.8 % (ref 11.5–15.5)
WBC: 7.2 10*3/uL (ref 4.0–10.5)

## 2018-09-29 LAB — TSH: TSH: 2.13 u[IU]/mL (ref 0.35–4.50)

## 2018-09-29 LAB — VITAMIN D 25 HYDROXY (VIT D DEFICIENCY, FRACTURES): VITD: 13.31 ng/mL — AB (ref 30.00–100.00)

## 2018-09-29 LAB — VITAMIN B12: Vitamin B-12: 225 pg/mL (ref 211–911)

## 2018-09-29 LAB — HEMOGLOBIN A1C: HEMOGLOBIN A1C: 5.4 % (ref 4.6–6.5)

## 2018-09-29 MED ORDER — VITAMIN D (ERGOCALCIFEROL) 1.25 MG (50000 UNIT) PO CAPS: 50000.0000 [IU] | ORAL_CAPSULE | ORAL | 0 refills | Status: AC

## 2018-09-29 MED ORDER — PANTOPRAZOLE SODIUM 40 MG PO TBEC: 40.0000 mg | DELAYED_RELEASE_TABLET | Freq: Two times a day (BID) | ORAL | 0 refills | Status: DC

## 2018-09-29 MED ORDER — LOSARTAN POTASSIUM-HCTZ 100-25 MG PO TABS
1.0000 | ORAL_TABLET | Freq: Every day | ORAL | 1 refills | Status: DC
Start: 2018-09-29 — End: 2019-03-28

## 2018-09-29 MED ORDER — AMLODIPINE BESYLATE 5 MG PO TABS: 5.0000 mg | ORAL_TABLET | Freq: Every day | ORAL | 1 refills | Status: DC

## 2018-09-29 MED ORDER — BUPROPION HCL ER (XL) 300 MG PO TB24: 300.0000 mg | ORAL_TABLET | Freq: Every day | ORAL | 1 refills | Status: DC

## 2018-09-29 MED ORDER — ESCITALOPRAM OXALATE 20 MG PO TABS: 20.0000 mg | ORAL_TABLET | Freq: Every day | ORAL | 1 refills | Status: DC

## 2018-09-29 NOTE — Progress Notes (Signed)
Jenny Parsons is a 43 y.o. female with the following history as recorded in EpicCare:  Patient Active Problem List   Diagnosis Date Noted  . Cervical paraspinal muscle spasm 05/20/2017  . Allergic rhinitis 02/02/2017  . Depression with anxiety 11/06/2016  . Snoring 11/05/2016  . Essential hypertension, benign 12/22/2011  . Routine general medical examination at a health care facility 12/22/2011    Current Outpatient Medications  Medication Sig Dispense Refill  . amLODipine (NORVASC) 5 MG tablet Take 1 tablet (5 mg total) by mouth daily. 90 tablet 1  . buPROPion (WELLBUTRIN XL) 300 MG 24 hr tablet Take 1 tablet (300 mg total) by mouth daily. 90 tablet 1  . escitalopram (LEXAPRO) 20 MG tablet Take 1 tablet (20 mg total) by mouth daily. 90 tablet 1  . fluticasone (FLONASE) 50 MCG/ACT nasal spray Place 2 sprays into both nostrils daily. 16 g 6  . losartan-hydrochlorothiazide (HYZAAR) 100-25 MG tablet Take 1 tablet by mouth daily. 90 tablet 1  . pantoprazole (PROTONIX) 40 MG tablet Take 1 tablet (40 mg total) by mouth 2 (two) times daily. 180 tablet 0  . nabumetone (RELAFEN) 500 MG tablet   1   No current facility-administered medications for this visit.     Allergies: Ramipril  Past Medical History:  Diagnosis Date  . Hypertension     Past Surgical History:  Procedure Laterality Date  . TUBAL LIGATION      Family History  Problem Relation Age of Onset  . Hypertension Mother   . Breast cancer Maternal Grandmother   . Cancer Neg Hx   . Diabetes Neg Hx   . Stroke Neg Hx     Social History   Tobacco Use  . Smoking status: Never Smoker  . Smokeless tobacco: Never Used  Substance Use Topics  . Alcohol use: No    Subjective:  Patient presents with fatigue; requesting to have labs updated today- specifically asking to have thyroid level checked- feels like she is hot all the time; doesn't feel like she has "much get up and go" but denies any concerns for worsening  depression. Weight is up almost 10 pounds since end of August; not exercising regularly; feels that eating habits are not significantly different- does eat out once per week; Has been told that she snores but seems positional- does not feel that she stops breathing at night; Denies any chest pain, shortness of breath, blurred vision or headache.   LMP- ablation;      Objective:  Vitals:   09/29/18 0925  BP: 128/68  Pulse: 73  Temp: 97.9 F (36.6 C)  TempSrc: Oral  SpO2: 98%  Weight: 226 lb 1.3 oz (102.5 kg)  Height: '5\' 1"'$  (1.549 m)    General: Well developed, well nourished, in no acute distress  Skin : Warm and dry.  Head: Normocephalic and atraumatic  Eyes: Sclera and conjunctiva clear; pupils round and reactive to light; extraocular movements intact  Ears: External normal; canals clear; tympanic membranes normal  Oropharynx: Pink, supple. No suspicious lesions  Neck: Supple without thyromegaly, adenopathy  Lungs: Respirations unlabored; clear to auscultation bilaterally without wheeze, rales, rhonchi  CVS exam: normal rate and regular rhythm.  Musculoskeletal: No deformities; no active joint inflammation  Extremities: No edema, cyanosis, clubbing  Vessels: Symmetric bilaterally  Neurologic: Alert and oriented; speech intact; face symmetrical; moves all extremities well; CNII-XII intact without focal deficit   Assessment:  1. Other fatigue   2. Weight gain  Plan:  Will update labs today; suspect multifactorial issues contributing; encouraged to try and start exercising regularly- does have access to gym through her employer; do not feel thyroid ultrasound needs to be done at this time; discussed sleep study/ possible sleep study- she prefers to work on weight loss options first; follow-up to be determined based on lab results.  Spent 30 minutes with patient; greater than 50% spent in counseling;     No follow-ups on file.  Orders Placed This Encounter  Procedures   . CBC w/Diff    Standing Status:   Future    Standing Expiration Date:   09/29/2019  . Comp Met (CMET)    Standing Status:   Future    Standing Expiration Date:   09/29/2019  . TSH    Standing Status:   Future    Standing Expiration Date:   09/29/2019  . HgB A1c    Standing Status:   Future    Standing Expiration Date:   09/29/2019  . B12    Standing Status:   Future    Standing Expiration Date:   09/29/2019  . Vitamin D (25 hydroxy)    Standing Status:   Future    Standing Expiration Date:   09/29/2019    Requested Prescriptions    No prescriptions requested or ordered in this encounter

## 2018-10-25 ENCOUNTER — Encounter: Payer: Self-pay | Admitting: Family

## 2018-10-25 ENCOUNTER — Other Ambulatory Visit: Payer: Self-pay | Admitting: Family

## 2018-10-25 DIAGNOSIS — J0100 Acute maxillary sinusitis, unspecified: Secondary | ICD-10-CM

## 2018-10-25 MED ORDER — AMOXICILLIN-POT CLAVULANATE 875-125 MG PO TABS: 1.0000 | ORAL_TABLET | Freq: Two times a day (BID) | ORAL | 0 refills | Status: DC

## 2018-10-27 ENCOUNTER — Encounter: Payer: Self-pay | Admitting: Family

## 2018-10-27 ENCOUNTER — Ambulatory Visit (INDEPENDENT_AMBULATORY_CARE_PROVIDER_SITE_OTHER): Payer: No Typology Code available for payment source | Admitting: Family

## 2018-10-27 VITALS — BP 126/76 | HR 73 | Temp 98.2°F | Ht 61.0 in | Wt 223.1 lb

## 2018-10-27 DIAGNOSIS — J019 Acute sinusitis, unspecified: Secondary | ICD-10-CM

## 2018-10-27 MED ORDER — MAGIC MOUTHWASH: 5.0000 mL | Freq: Four times a day (QID) | ORAL | 0 refills | Status: DC | PRN

## 2018-10-27 MED ORDER — PREDNISONE 20 MG PO TABS: 20.0000 mg | ORAL_TABLET | Freq: Every day | ORAL | 0 refills | Status: DC

## 2018-10-27 NOTE — Progress Notes (Signed)
Jenny Parsons is a 43 y.o. female with the following history as recorded in [employer]:  Patient Active Problem List   Diagnosis Date Noted  . Cervical paraspinal muscle spasm 05/20/2017  . Allergic rhinitis 02/02/2017  . Depression with anxiety 11/06/2016  . Snoring 11/05/2016  . Essential hypertension, benign 12/22/2011  . Routine general medical examination at a health care facility 12/22/2011    Current Outpatient Medications  Medication Sig Dispense Refill  . amLODipine (NORVASC) 5 MG tablet Take 1 tablet (5 mg total) by mouth daily. 90 tablet 1  . amoxicillin-clavulanate (AUGMENTIN) 875-125 MG tablet Take 1 tablet by mouth 2 (two) times daily for 10 days. 20 tablet 0  . buPROPion (WELLBUTRIN XL) 300 MG 24 hr tablet Take 1 tablet (300 mg total) by mouth daily. 90 tablet 1  . escitalopram (LEXAPRO) 20 MG tablet Take 1 tablet (20 mg total) by mouth daily. 90 tablet 1  . fluticasone (FLONASE) 50 MCG/ACT nasal spray Place 2 sprays into both nostrils daily. 16 g 6  . losartan-hydrochlorothiazide (HYZAAR) 100-25 MG tablet Take 1 tablet by mouth daily. 90 tablet 1  . nabumetone (RELAFEN) 500 MG tablet   1  . pantoprazole (PROTONIX) 40 MG tablet Take 1 tablet (40 mg total) by mouth 2 (two) times daily. 180 tablet 0  . Vitamin D, Ergocalciferol, (DRISDOL) 1.25 MG (50000 UT) CAPS capsule Take 1 capsule (50,000 Units total) by mouth every 7 (seven) days for 12 doses. 12 capsule 0  . magic mouthwash SOLN Take 5 mLs by mouth 4 (four) times daily as needed for mouth pain. Please mix equal parts of Maalox Extra Strength, Nystatin and Diphendydramine 120 mL 0  . predniSONE (DELTASONE) 20 MG tablet Take 1 tablet (20 mg total) by mouth daily with breakfast. 5 tablet 0   No current facility-administered medications for this visit.     Allergies: Ramipril  Past Medical History:  Diagnosis Date  . Hypertension     Past Surgical History:  Procedure Laterality Date  . TUBAL LIGATION       Family History  Problem Relation Age of Onset  . Hypertension Mother   . Breast cancer Maternal Grandmother   . Cancer Neg Hx   . Diabetes Neg Hx   . Stroke Neg Hx     Social History   Tobacco Use  . Smoking status: Never Smoker  . Smokeless tobacco: Never Used  Substance Use Topics  . Alcohol use: No    Subjective:  Patient was started on Augmentin on Monday for suspected sinus infection; presents today with concerns for persisting sore throat/ cough; notes that it "hurts to cough"/ "throat is killing me." No fever today but thinks she had a fever yesterday;  No chest pain or shortness of breath;  Objective:  Vitals:   10/27/18 1016  BP: 126/76  Pulse: 73  Temp: 98.2 F (36.8 C)  TempSrc: Oral  SpO2: 98%  Weight: 223 lb 1.3 oz (101.2 kg)  Height: 5\' 1"  (1.549 m)    General: Well developed, well nourished, in no acute distress  Skin : Warm and dry.  Head: Normocephalic and atraumatic  Eyes: Sclera and conjunctiva clear; pupils round and reactive to light; extraocular movements intact  Ears: External normal; canals clear; tympanic membranes normal  Oropharynx: Pink, supple. No suspicious lesions  Neck: Supple without thyromegaly, adenopathy  Lungs: Respirations unlabored; clear to auscultation bilaterally without wheeze, rales, rhonchi  CVS exam: normal rate and regular rhythm.  Neurologic: Alert and  oriented; speech intact; face symmetrical; moves all extremities well; CNII-XII intact without focal deficit   Assessment:  1. Acute sinusitis, recurrence not specified, unspecified location     Plan:  Physical exam is reassuring; do not feel strep test warranted as she has been on Augmentin x 2 days; continue Augmentin as prescribed; add Prednisone 20 mg qd x 5 days, Magic Mouthwash- use as directed; increase fluids, rest and follow-up worse, no better.   No follow-ups on file.  No orders of the defined types were placed in this encounter.   Requested Prescriptions    Signed Prescriptions Disp Refills  . predniSONE (DELTASONE) 20 MG tablet 5 tablet 0    Sig: Take 1 tablet (20 mg total) by mouth daily with breakfast.  . magic mouthwash SOLN 120 mL 0    Sig: Take 5 mLs by mouth 4 (four) times daily as needed for mouth pain. Please mix equal parts of Maalox Extra Strength, Nystatin and Diphendydramine

## 2018-12-01 ENCOUNTER — Other Ambulatory Visit: Payer: Self-pay | Admitting: Obstetrics and Gynecology

## 2018-12-01 DIAGNOSIS — Z1231 Encounter for screening mammogram for malignant neoplasm of breast: Secondary | ICD-10-CM

## 2018-12-21 ENCOUNTER — Encounter: Payer: Self-pay | Admitting: Family

## 2018-12-24 ENCOUNTER — Ambulatory Visit: Payer: No Typology Code available for payment source

## 2019-03-28 ENCOUNTER — Other Ambulatory Visit: Payer: Self-pay

## 2019-03-28 ENCOUNTER — Encounter: Payer: Self-pay | Admitting: Family

## 2019-03-28 ENCOUNTER — Ambulatory Visit (INDEPENDENT_AMBULATORY_CARE_PROVIDER_SITE_OTHER): Payer: No Typology Code available for payment source | Admitting: Family

## 2019-03-28 VITALS — BP 124/78 | HR 79 | Temp 98.5°F | Ht 61.0 in | Wt 225.1 lb

## 2019-03-28 DIAGNOSIS — F419 Anxiety disorder, unspecified: Secondary | ICD-10-CM | POA: Diagnosis not present

## 2019-03-28 DIAGNOSIS — F329 Major depressive disorder, single episode, unspecified: Secondary | ICD-10-CM

## 2019-03-28 DIAGNOSIS — I10 Essential (primary) hypertension: Secondary | ICD-10-CM

## 2019-03-28 DIAGNOSIS — M79672 Pain in left foot: Secondary | ICD-10-CM

## 2019-03-28 MED ORDER — AMLODIPINE BESYLATE 5 MG PO TABS: 5.0000 mg | ORAL_TABLET | Freq: Every day | ORAL | 1 refills | Status: DC

## 2019-03-28 MED ORDER — BUPROPION HCL ER (XL) 150 MG PO TB24: 150.0000 mg | ORAL_TABLET | Freq: Every day | ORAL | 0 refills | Status: DC

## 2019-03-28 MED ORDER — MELOXICAM 15 MG PO TABS: 15.0000 mg | ORAL_TABLET | Freq: Every day | ORAL | 0 refills | Status: DC

## 2019-03-28 MED ORDER — LOSARTAN POTASSIUM-HCTZ 100-25 MG PO TABS: 1.0000 | ORAL_TABLET | Freq: Every day | ORAL | 1 refills | Status: DC

## 2019-03-28 NOTE — Progress Notes (Signed)
Fransisco HertzKimberly W Patterson is a 43 y.o. female with the following history as recorded in [facility]:  Patient Active Problem List   Diagnosis Date Noted  . Cervical paraspinal muscle spasm 05/20/2017  . Allergic rhinitis 02/02/2017  . Depression with anxiety 11/06/2016  . Snoring 11/05/2016  . Essential hypertension, benign 12/22/2011  . Routine general medical examination at a health care facility 12/22/2011    Current Outpatient Medications  Medication Sig Dispense Refill  . amLODipine (NORVASC) 5 MG tablet Take 1 tablet (5 mg total) by mouth daily. 90 tablet 1  . buPROPion (WELLBUTRIN XL) 150 MG 24 hr tablet Take 1 tablet (150 mg total) by mouth daily. 90 tablet 0  . escitalopram (LEXAPRO) 20 MG tablet Take 1 tablet (20 mg total) by mouth daily. 90 tablet 1  . fluticasone (FLONASE) 50 MCG/ACT nasal spray Place 2 sprays into both nostrils daily. 16 g 6  . losartan-hydrochlorothiazide (HYZAAR) 100-25 MG tablet Take 1 tablet by mouth daily. 90 tablet 1  . magic mouthwash SOLN Take 5 mLs by mouth 4 (four) times daily as needed for mouth pain. Please mix equal parts of Maalox Extra Strength, Nystatin and Diphendydramine 120 mL 0  . nabumetone (RELAFEN) 500 MG tablet   1  . pantoprazole (PROTONIX) 40 MG tablet Take 1 tablet (40 mg total) by mouth 2 (two) times daily. 180 tablet 0  . predniSONE (DELTASONE) 20 MG tablet Take 1 tablet (20 mg total) by mouth daily with breakfast. 5 tablet 0  . meloxicam (MOBIC) 15 MG tablet Take 1 tablet (15 mg total) by mouth daily. 30 tablet 0   No current facility-administered medications for this visit.     Allergies: Ramipril  Past Medical History:  Diagnosis Date  . Hypertension     Past Surgical History:  Procedure Laterality Date  . TUBAL LIGATION      Family History  Problem Relation Age of Onset  . Hypertension Mother   . Breast cancer Maternal Grandmother   . Cancer Neg Hx   . Diabetes Neg Hx   . Stroke Neg Hx     Social History   Tobacco  Use  . Smoking status: Never Smoker  . Smokeless tobacco: Never Used  Substance Use Topics  . Alcohol use: No    Subjective:  Patient presents with concerns for left heel pain x 2 months; no history of injury or trauma; notes that pain feels localized along the back of the heel; has tried OTC Ibuprofen with limited benefit; Also wants to discuss coming off her Wellbutrin and Lexapro; feels like she is in a much better place mentally and emotionally; Would like to discuss options for weight loss as well;      Objective:  Vitals:   03/28/19 1323  BP: 124/78  Pulse: 79  Temp: 98.5 F (36.9 C)  TempSrc: Oral  SpO2: 98%  Weight: 225 lb 1.3 oz (102.1 kg)  Height: 5\' 1"  (1.549 m)    General: Well developed, well nourished, in no acute distress  Skin : Warm and dry.  Head: Normocephalic and atraumatic  Lungs: Respirations unlabored; clear to auscultation bilaterally without wheeze, rales, rhonchi  Musculoskeletal: No deformities; no active joint inflammation  Extremities: No edema, cyanosis, clubbing  Vessels: Symmetric bilaterally  Neurologic: Alert and oriented; speech intact; face symmetrical; moves all extremities well; CNII-XII intact without focal deficit   Assessment:  1. Class 3 severe obesity with serious comorbidity in adult, unspecified BMI, unspecified obesity type (HCC)   2.  Pain of left heel   3. Anxiety and depression   4. Essential hypertension, benign     Plan:  1. Refer to medical weight loss; 2. Trial of Mobic 15 mg daily; follow-up with sports medicine if symptoms persist; 3. Improving; try tapering off Wellbutrin XL- lower to 150 mg; call back with response in 2-3 weeks; 4. Stable; refills updated.   Return for Dr. Tamala Julian for follow-up if symptoms persist.  Orders Placed This Encounter  Procedures  . Amb Ref to Medical Weight Management    Referral Priority:   Routine    Referral Type:   Consultation    Number of Visits Requested:   1    Requested  Prescriptions   Signed Prescriptions Disp Refills  . meloxicam (MOBIC) 15 MG tablet 30 tablet 0    Sig: Take 1 tablet (15 mg total) by mouth daily.  Marland Kitchen losartan-hydrochlorothiazide (HYZAAR) 100-25 MG tablet 90 tablet 1    Sig: Take 1 tablet by mouth daily.  Marland Kitchen amLODipine (NORVASC) 5 MG tablet 90 tablet 1    Sig: Take 1 tablet (5 mg total) by mouth daily.  Marland Kitchen buPROPion (WELLBUTRIN XL) 150 MG 24 hr tablet 90 tablet 0    Sig: Take 1 tablet (150 mg total) by mouth daily.

## 2019-05-17 ENCOUNTER — Encounter: Payer: Self-pay | Admitting: Family

## 2019-05-17 ENCOUNTER — Other Ambulatory Visit: Payer: Self-pay | Admitting: Family

## 2019-05-17 DIAGNOSIS — M79673 Pain in unspecified foot: Secondary | ICD-10-CM

## 2019-05-24 ENCOUNTER — Ambulatory Visit (INDEPENDENT_AMBULATORY_CARE_PROVIDER_SITE_OTHER): Payer: No Typology Code available for payment source

## 2019-05-24 ENCOUNTER — Encounter: Payer: Self-pay | Admitting: Family

## 2019-05-24 ENCOUNTER — Ambulatory Visit (INDEPENDENT_AMBULATORY_CARE_PROVIDER_SITE_OTHER): Payer: No Typology Code available for payment source | Admitting: Podiatry

## 2019-05-24 ENCOUNTER — Other Ambulatory Visit: Payer: Self-pay

## 2019-05-24 ENCOUNTER — Other Ambulatory Visit: Payer: Self-pay | Admitting: Family

## 2019-05-24 ENCOUNTER — Other Ambulatory Visit: Payer: Self-pay | Admitting: Podiatry

## 2019-05-24 VITALS — BP 139/82 | HR 76

## 2019-05-24 DIAGNOSIS — L03032 Cellulitis of left toe: Secondary | ICD-10-CM

## 2019-05-24 DIAGNOSIS — L02612 Cutaneous abscess of left foot: Secondary | ICD-10-CM

## 2019-05-24 DIAGNOSIS — M79672 Pain in left foot: Secondary | ICD-10-CM

## 2019-05-24 DIAGNOSIS — M7662 Achilles tendinitis, left leg: Secondary | ICD-10-CM

## 2019-05-24 DIAGNOSIS — M779 Enthesopathy, unspecified: Secondary | ICD-10-CM | POA: Diagnosis not present

## 2019-05-24 DIAGNOSIS — M7732 Calcaneal spur, left foot: Secondary | ICD-10-CM

## 2019-05-24 MED ORDER — CEPHALEXIN 500 MG PO CAPS: 500.0000 mg | ORAL_CAPSULE | Freq: Three times a day (TID) | ORAL | 0 refills | Status: DC

## 2019-05-24 MED ORDER — IBUPROFEN 800 MG PO TABS: 800.0000 mg | ORAL_TABLET | Freq: Three times a day (TID) | ORAL | 0 refills | Status: DC | PRN

## 2019-05-24 MED ORDER — METHOCARBAMOL 500 MG PO TABS: 500.0000 mg | ORAL_TABLET | Freq: Three times a day (TID) | ORAL | 0 refills | Status: DC | PRN

## 2019-05-24 NOTE — Patient Instructions (Addendum)

## 2019-05-25 NOTE — Progress Notes (Signed)
Subjective:    Patient ID: Jenny Parsons, female    DOB: 11/11/1975, 43 y.o.   MRN: [id number]  HPI The patient is here for an acute visit.   Neck pain:  She noticed it Friday evening, 1 week ago.  The pain is in the right posterior neck, right upper back, shoulder blade area and in her right axilla.  The right upper arm is achy, but she denies any radiating pain down the arm.  She denies any numbness or tingling.  The pain is achy and a stabbing type pain.  She denies swelling, rash or changes in the skin.  She denies weakness in her hand.  She has good range of motion of her shoulder, but it does increase the pain.  She is not having any headaches. She denies chronic neck pain.  She is unsure if she slept wrong or if she did something when she was helping her daughter move.   She took tylenol and ibuprofen.  She took methocarbamol at night for the past couple of days and it helped just a little.  Heat does help but it is temporary.  Her pain is a 7-8/10.  She is in enough discomfort that she had to take the day off today.   Medications and allergies reviewed with patient and updated if appropriate.  Patient Active Problem List   Diagnosis Date Noted  . Cervical paraspinal muscle spasm 05/20/2017  . Allergic rhinitis 02/02/2017  . Depression with anxiety 11/06/2016  . Snoring 11/05/2016  . Essential hypertension, benign 12/22/2011  . Routine general medical examination at a health care facility 12/22/2011  . Hypertension 05/01/2011    Current Outpatient Medications on File Prior to Visit  Medication Sig Dispense Refill  . amLODipine (NORVASC) 5 MG tablet Take 1 tablet (5 mg total) by mouth daily. 90 tablet 1  . buPROPion (WELLBUTRIN XL) 150 MG 24 hr tablet Take 1 tablet (150 mg total) by mouth daily. 90 tablet 0  . buPROPion (WELLBUTRIN XL) 300 MG 24 hr tablet     . cephALEXin (KEFLEX) 500 MG capsule Take 1 capsule (500 mg total) by mouth 3 (three) times daily. 30 capsule  0  . escitalopram (LEXAPRO) 20 MG tablet Take 1 tablet (20 mg total) by mouth daily. 90 tablet 1  . fluticasone (FLONASE) 50 MCG/ACT nasal spray Place 2 sprays into both nostrils daily. 16 g 6  . ibuprofen (ADVIL) 800 MG tablet Take 1 tablet (800 mg total) by mouth every 8 (eight) hours as needed. 30 tablet 0  . losartan-hydrochlorothiazide (HYZAAR) 100-25 MG tablet Take 1 tablet by mouth daily. 90 tablet 1  . methocarbamol (ROBAXIN) 500 MG tablet Take 1 tablet (500 mg total) by mouth every 8 (eight) hours as needed. 30 tablet 0  . nabumetone (RELAFEN) 500 MG tablet   1  . pantoprazole (PROTONIX) 40 MG tablet Take 1 tablet (40 mg total) by mouth 2 (two) times daily. 180 tablet 0   No current facility-administered medications on file prior to visit.     Past Medical History:  Diagnosis Date  . Hypertension     Past Surgical History:  Procedure Laterality Date  . TUBAL LIGATION      Social History   Socioeconomic History  . Marital status: Married    Spouse name: Not on file  . Number of children: Not on file  . Years of education: Not on file  . Highest education level: Not on file  Occupational History  .  Not on file  Social Needs  . Financial resource strain: Not on file  . Food insecurity    Worry: Not on file    Inability: Not on file  . Transportation needs    Medical: Not on file    Non-medical: Not on file  Tobacco Use  . Smoking status: Never Smoker  . Smokeless tobacco: Never Used  Substance and Sexual Activity  . Alcohol use: No  . Drug use: No  . Sexual activity: Yes    Birth control/protection: Surgical  Lifestyle  . Physical activity    Days per week: Not on file    Minutes per session: Not on file  . Stress: Not on file  Relationships  . Social Musicianconnections    Talks on phone: Not on file    Gets together: Not on file    Attends religious service: Not on file    Active member of club or organization: Not on file    Attends meetings of clubs or  organizations: Not on file    Relationship status: Not on file  Other Topics Concern  . Not on file  Social History Narrative  . Not on file    Family History  Problem Relation Age of Onset  . Hypertension Mother   . Breast cancer Maternal Grandmother   . Cancer Neg Hx   . Diabetes Neg Hx   . Stroke Neg Hx     Review of Systems Per HPI    Objective:   Vitals:   05/26/19 0801  BP: 140/82  Pulse: 67  Temp: 98.1 F (36.7 C)  SpO2: 98%   BP Readings from Last 3 Encounters:  05/26/19 140/82  05/24/19 139/82  03/28/19 124/78   Wt Readings from Last 3 Encounters:  05/26/19 230 lb 3.2 oz (104.4 kg)  03/28/19 225 lb 1.3 oz (102.1 kg)  10/27/18 223 lb 1.3 oz (101.2 kg)   Body mass index is 43.5 kg/m.   Physical Exam Constitutional:      General: She is not in acute distress (appears uncomfortable).    Appearance: Normal appearance. She is not ill-appearing.  HENT:     Head: Normocephalic and atraumatic.  Musculoskeletal:        General: Tenderness (right posterior neck, right trapezius) present. No swelling or deformity.     Comments: No c-spine or thoracic spine tenderness  Skin:    General: Skin is warm and dry.     Findings: No erythema or rash.  Neurological:     General: No focal deficit present.     Mental Status: She is alert.     Sensory: No sensory deficit.     Motor: No weakness.            Assessment & Plan:    See Problem List for Assessment and Plan of chronic medical problems.

## 2019-05-26 ENCOUNTER — Other Ambulatory Visit: Payer: Self-pay

## 2019-05-26 ENCOUNTER — Encounter: Payer: Self-pay | Admitting: Internal Medicine

## 2019-05-26 ENCOUNTER — Ambulatory Visit (INDEPENDENT_AMBULATORY_CARE_PROVIDER_SITE_OTHER): Payer: No Typology Code available for payment source | Admitting: Internal Medicine

## 2019-05-26 VITALS — BP 140/82 | HR 67 | Temp 98.1°F | Ht 61.0 in | Wt 230.2 lb

## 2019-05-26 DIAGNOSIS — S46811A Strain of other muscles, fascia and tendons at shoulder and upper arm level, right arm, initial encounter: Secondary | ICD-10-CM | POA: Diagnosis not present

## 2019-05-26 MED ORDER — METHYLPREDNISOLONE ACETATE 80 MG/ML IJ SUSP
80.0000 mg | Freq: Once | INTRAMUSCULAR | Status: AC
  Administered 2019-05-26: 80 mg via INTRAMUSCULAR

## 2019-05-26 NOTE — Patient Instructions (Signed)
You had a steroid injection here today.    Continue ibuprofen and tylenol.  Continue the heat.   Apply anything topical.   Take methocarbamol regularly today.      Please call if there is no improvement in your symptoms.

## 2019-05-26 NOTE — Assessment & Plan Note (Signed)
Significant pain secondary to trapezius muscle spasm/strain No evidence of cervical radiculopathy Ibuprofen, Tylenol and methocarbamol helping minimally We will give her Depo-Medrol 80 mg IM x 1 today Continue ibuprofen and Tylenol She is off of work today and will take the methocarbamol throughout the day to see if that helps more-if this does not make her drowsy she will take it for a couple of days Continue heat Gentle stretching, massage Can apply topical ointments such as Aspercreme with lidocaine Note given for work today She will let me know if there is no improvement over the next 24 hours and I can write a note to get her out of work tomorrow

## 2019-05-27 MED ORDER — CYCLOBENZAPRINE HCL 5 MG PO TABS: 5.0000 mg | ORAL_TABLET | Freq: Three times a day (TID) | ORAL | 1 refills | Status: DC | PRN

## 2019-05-27 NOTE — Addendum Note (Signed)
Addended by: Binnie Rail on: 05/27/2019 08:15 AM   Modules accepted: Orders

## 2019-05-28 LAB — WOUND CULTURE
MICRO NUMBER:: 844107
SPECIMEN QUALITY:: ADEQUATE

## 2019-06-01 ENCOUNTER — Encounter: Payer: Self-pay | Admitting: Family

## 2019-06-01 NOTE — Progress Notes (Signed)
Subjective:   Patient ID: Jenny Parsons, female   DOB: 43 y.o.   MRN: [id number]   HPI 43 year old female presents the office today for 2 concerns.  Her main concern is pain in the left posterior heel which is been ongoing for last 2 months.  She feels a sharp ache to the area and hurts more with pressure.  Also this morning morning when she first gets out and work the patient been on her feet all day.  She denies any recent injury, swelling or any numbness or tingling.  She also states that she is having ingrown toenail left big toe, lateral aspect which is been ongoing last 2 weeks.  She has noted some blood as well as pus coming from the nail border.  There is painful with pressure.  No red streaks.   Review of Systems  All other systems reviewed and are negative.  Past Medical History:  Diagnosis Date  . Hypertension     Past Surgical History:  Procedure Laterality Date  . TUBAL LIGATION       Current Outpatient Medications:  .  amLODipine (NORVASC) 5 MG tablet, Take 1 tablet (5 mg total) by mouth daily., Disp: 90 tablet, Rfl: 1 .  buPROPion (WELLBUTRIN XL) 150 MG 24 hr tablet, Take 1 tablet (150 mg total) by mouth daily., Disp: 90 tablet, Rfl: 0 .  buPROPion (WELLBUTRIN XL) 300 MG 24 hr tablet, , Disp: , Rfl:  .  escitalopram (LEXAPRO) 20 MG tablet, Take 1 tablet (20 mg total) by mouth daily., Disp: 90 tablet, Rfl: 1 .  fluticasone (FLONASE) 50 MCG/ACT nasal spray, Place 2 sprays into both nostrils daily., Disp: 16 g, Rfl: 6 .  losartan-hydrochlorothiazide (HYZAAR) 100-25 MG tablet, Take 1 tablet by mouth daily., Disp: 90 tablet, Rfl: 1 .  methocarbamol (ROBAXIN) 500 MG tablet, Take 1 tablet (500 mg total) by mouth every 8 (eight) hours as needed., Disp: 30 tablet, Rfl: 0 .  nabumetone (RELAFEN) 500 MG tablet, , Disp: , Rfl: 1 .  pantoprazole (PROTONIX) 40 MG tablet, Take 1 tablet (40 mg total) by mouth 2 (two) times daily., Disp: 180 tablet, Rfl: 0 .  cephALEXin (KEFLEX)  500 MG capsule, Take 1 capsule (500 mg total) by mouth 3 (three) times daily., Disp: 30 capsule, Rfl: 0 .  cyclobenzaprine (FLEXERIL) 5 MG tablet, Take 1-2 tablets (5-10 mg total) by mouth 3 (three) times daily as needed for muscle spasms., Disp: 30 tablet, Rfl: 1 .  ibuprofen (ADVIL) 800 MG tablet, Take 1 tablet (800 mg total) by mouth every 8 (eight) hours as needed., Disp: 30 tablet, Rfl: 0  Allergies  Allergen Reactions  . Ramipril Cough          Objective:  Physical Exam  General: AAO x3, NAD  Dermatological: Incurvation present to lateral aspect left hallux toenail with localized edema and erythema to the area.  Prior to the procedure unable to identify any purulence or dental procedures normal purulence identified which was cultured.  No ascending cellulitis.  No open lesions.  Vascular: Dorsalis Pedis artery and Posterior Tibial artery pedal pulses are 2/4 bilateral with immedate capillary fill time. Pedal hair growth present. No varicosities and no lower extremity edema present bilateral. There is no pain with calf compression, swelling, warmth, erythema.   Neruologic: Grossly intact via light touch bilateral. Vibratory intact via tuning fork bilateral. Protective threshold with Semmes Wienstein monofilament intact to all pedal sites bilateral.  Negative Tinel sign.  Musculoskeletal: There is  actually aspect left heel no areas of prominent bone spur.  Mild discomfort on the distal portion of Achilles tendon insertion into the calcaneus.  Thompson test negative.  No pain with lateral compression of calcaneus.  No edema, erythema to the heel.  Muscular strength 5/5 in all groups tested bilateral.  Gait: Unassisted, Nonantalgic.       Assessment:   Left lateral hallux ingrown toenail with localized infection; left posterior heel pain, bone spur/tendinitis    Plan:  -Treatment options discussed including all alternatives, risks, and complications -Etiology of symptoms were  discussed  1. Left lateral hallux symptomatic ingrown toenail -At this time, recommended partial nail removal with possible chemical matricectomy to the symptomatic portion of the nail. Risks and complications were discussed with the patient for which they understand and written consent was obtained. Under sterile conditions a total of 3 mL of a mixture of 2% lidocaine plain and 0.5% Marcaine plain was infiltrated in a hallux block fashion. Once anesthetized, the skin was prepped in sterile fashion. A tourniquet was then applied. Next the lateral aspect of hallux nail border was then sharply excised making sure to remove the entire offending nail border.  Small amount of purulence was identified which was cultured.  Due to infection hold off on phenol.  Once the nails were ensured to be removed area was debrided and the underlying skin was intact. There is no purulence identified in the procedure. Next phenol was then applied under standard conditions and copiously irrigated. Silvadene was applied. A dry sterile dressing was applied. After application of the dressing the tourniquet was removed and there is found to be an immediate capillary refill time to the digit. The patient tolerated the procedure well any complications. Post procedure instructions were discussed the patient for which he verbally understood. Follow-up in one week for nail check or sooner if any problems are to arise. Discussed signs/symptoms of infection and directed to call the office immediately should any occur or go directly to the emergency room. In the meantime, encouraged to call the office with any questions, concerns, changes symptoms. -Keflex -RTC 1 week  2.  Posterior heel pain, heel spur/tendinitis -Discussed resting, icing exercises daily.  Dispensed night splint.  Discussed heel lift as well as a gel offloading pad.  Ibuprofen prescribed to take prn.     Trula Slade DPM

## 2019-06-03 ENCOUNTER — Ambulatory Visit (INDEPENDENT_AMBULATORY_CARE_PROVIDER_SITE_OTHER): Payer: No Typology Code available for payment source | Admitting: Podiatry

## 2019-06-03 ENCOUNTER — Other Ambulatory Visit: Payer: Self-pay

## 2019-06-03 DIAGNOSIS — L02612 Cutaneous abscess of left foot: Secondary | ICD-10-CM

## 2019-06-03 DIAGNOSIS — M779 Enthesopathy, unspecified: Secondary | ICD-10-CM | POA: Diagnosis not present

## 2019-06-03 DIAGNOSIS — M7732 Calcaneal spur, left foot: Secondary | ICD-10-CM | POA: Diagnosis not present

## 2019-06-03 DIAGNOSIS — L03032 Cellulitis of left toe: Secondary | ICD-10-CM

## 2019-06-03 MED ORDER — METHYLPREDNISOLONE 4 MG PO TBPK: ORAL_TABLET | ORAL | 0 refills | Status: DC

## 2019-06-03 NOTE — Patient Instructions (Signed)
Continue soaking in epsom salts twice a day followed by antibiotic ointment and a band-aid. Can leave uncovered at night. Continue this until completely healed.  If the area has not healed in 2 weeks, call the office for follow-up appointment, or sooner if any problems arise.  Monitor for any signs/symptoms of infection. Call the office immediately if any occur or go directly to the emergency room. Call with any questions/concerns.   Achilles Tendinitis  with Rehab Achilles tendinitis is a disorder of the Achilles tendon. The Achilles tendon connects the large calf muscles (Gastrocnemius and Soleus) to the heel bone (calcaneus). This tendon is sometimes called the heel cord. It is important for pushing-off and standing on your toes and is important for walking, running, or jumping. Tendinitis is often caused by overuse and repetitive microtrauma. SYMPTOMS  Pain, tenderness, swelling, warmth, and redness may occur over the Achilles tendon even at rest.  Pain with pushing off, or flexing or extending the ankle.  Pain that is worsened after or during activity. CAUSES   Overuse sometimes seen with rapid increase in exercise programs or in sports requiring running and jumping.  Poor physical conditioning (strength and flexibility or endurance).  Running sports, especially training running down hills.  Inadequate warm-up before practice or play or failure to stretch before participation.  Injury to the tendon. PREVENTION   Warm up and stretch before practice or competition.  Allow time for adequate rest and recovery between practices and competition.  Keep up conditioning.  Keep up ankle and leg flexibility.  Improve or keep muscle strength and endurance.  Improve cardiovascular fitness.  Use proper technique.  Use proper equipment (shoes, skates).  To help prevent recurrence, taping, protective strapping, or an adhesive bandage may be recommended for several weeks after healing  is complete. PROGNOSIS   Recovery may take weeks to several months to heal.  Longer recovery is expected if symptoms have been prolonged.  Recovery is usually quicker if the inflammation is due to a direct blow as compared with overuse or sudden strain. RELATED COMPLICATIONS   Healing time will be prolonged if the condition is not correctly treated. The injury must be given plenty of time to heal.  Symptoms can reoccur if activity is resumed too soon.  Untreated, tendinitis may increase the risk of tendon rupture requiring additional time for recovery and possibly surgery. TREATMENT   The first treatment consists of rest anti-inflammatory medication, and ice to relieve the pain.  Stretching and strengthening exercises after resolution of pain will likely help reduce the risk of recurrence. Referral to a physical therapist or athletic trainer for further evaluation and treatment may be helpful.  A walking boot or cast may be recommended to rest the Achilles tendon. This can help break the cycle of inflammation and microtrauma.  Arch supports (orthotics) may be prescribed or recommended by your caregiver as an adjunct to therapy and rest.  Surgery to remove the inflamed tendon lining or degenerated tendon tissue is rarely necessary and has shown less than predictable results. MEDICATION   Nonsteroidal anti-inflammatory medications, such as aspirin and ibuprofen, may be used for pain and inflammation relief. Do not take within 7 days before surgery. Take these as directed by your caregiver. Contact your caregiver immediately if any bleeding, stomach upset, or signs of allergic reaction occur. Other minor pain relievers, such as acetaminophen, may also be used.  Pain relievers may be prescribed as necessary by your caregiver. Do not take prescription pain medication for longer  than 4 to 7 days. Use only as directed and only as much as you need.  Cortisone injections are rarely indicated.  Cortisone injections may weaken tendons and predispose to rupture. It is better to give the condition more time to heal than to use them. HEAT AND COLD  Cold is used to relieve pain and reduce inflammation for acute and chronic Achilles tendinitis. Cold should be applied for 10 to 15 minutes every 2 to 3 hours for inflammation and pain and immediately after any activity that aggravates your symptoms. Use ice packs or an ice massage.  Heat may be used before performing stretching and strengthening activities prescribed by your caregiver. Use a heat pack or a warm soak. SEEK MEDICAL CARE IF:  Symptoms get worse or do not improve in 2 weeks despite treatment.  New, unexplained symptoms develop. Drugs used in treatment may produce side effects.  EXERCISES:  RANGE OF MOTION (ROM) AND STRETCHING EXERCISES - Achilles Tendinitis  These exercises may help you when beginning to rehabilitate your injury. Your symptoms may resolve with or without further involvement from your physician, physical therapist or athletic trainer. While completing these exercises, remember:   Restoring tissue flexibility helps normal motion to return to the joints. This allows healthier, less painful movement and activity.  An effective stretch should be held for at least 30 seconds.  A stretch should never be painful. You should only feel a gentle lengthening or release in the stretched tissue.  STRETCH  Gastroc, Standing   Place hands on wall.  Extend right / left leg, keeping the front knee somewhat bent.  Slightly point your toes inward on your back foot.  Keeping your right / left heel on the floor and your knee straight, shift your weight toward the wall, not allowing your back to arch.  You should feel a gentle stretch in the right / left calf. Hold this position for 10 seconds. Repeat 3 times. Complete this stretch 2 times per day.  STRETCH  Soleus, Standing   Place hands on wall.  Extend right / left  leg, keeping the other knee somewhat bent.  Slightly point your toes inward on your back foot.  Keep your right / left heel on the floor, bend your back knee, and slightly shift your weight over the back leg so that you feel a gentle stretch deep in your back calf.  Hold this position for 10 seconds. Repeat 3 times. Complete this stretch 2 times per day.  STRETCH  Gastrocsoleus, Standing  Note: This exercise can place a lot of stress on your foot and ankle. Please complete this exercise only if specifically instructed by your caregiver.   Place the ball of your right / left foot on a step, keeping your other foot firmly on the same step.  Hold on to the wall or a rail for balance.  Slowly lift your other foot, allowing your body weight to press your heel down over the edge of the step.  You should feel a stretch in your right / left calf.  Hold this position for 10 seconds.  Repeat this exercise with a slight bend in your knee. Repeat 3 times. Complete this stretch 2 times per day.   STRENGTHENING EXERCISES - Achilles Tendinitis These exercises may help you when beginning to rehabilitate your injury. They may resolve your symptoms with or without further involvement from your physician, physical therapist or athletic trainer. While completing these exercises, remember:   Muscles can gain both  the endurance and the strength needed for everyday activities through controlled exercises.  Complete these exercises as instructed by your physician, physical therapist or athletic trainer. Progress the resistance and repetitions only as guided.  You may experience muscle soreness or fatigue, but the pain or discomfort you are trying to eliminate should never worsen during these exercises. If this pain does worsen, stop and make certain you are following the directions exactly. If the pain is still present after adjustments, discontinue the exercise until you can discuss the trouble with your  clinician.  STRENGTH - Plantar-flexors   Sit with your right / left leg extended. Holding onto both ends of a rubber exercise band/tubing, loop it around the ball of your foot. Keep a slight tension in the band.  Slowly push your toes away from you, pointing them downward.  Hold this position for 10 seconds. Return slowly, controlling the tension in the band/tubing. Repeat 3 times. Complete this exercise 2 times per day.   STRENGTH - Plantar-flexors   Stand with your feet shoulder width apart. Steady yourself with a wall or table using as little support as needed.  Keeping your weight evenly spread over the width of your feet, rise up on your toes.*  Hold this position for 10 seconds. Repeat 3 times. Complete this exercise 2 times per day.  *If this is too easy, shift your weight toward your right / left leg until you feel challenged. Ultimately, you may be asked to do this exercise with your right / left foot only.  STRENGTH  Plantar-flexors, Eccentric  Note: This exercise can place a lot of stress on your foot and ankle. Please complete this exercise only if specifically instructed by your caregiver.   Place the balls of your feet on a step. With your hands, use only enough support from a wall or rail to keep your balance.  Keep your knees straight and rise up on your toes.  Slowly shift your weight entirely to your right / left toes and pick up your opposite foot. Gently and with controlled movement, lower your weight through your right / left foot so that your heel drops below the level of the step. You will feel a slight stretch in the back of your calf at the end position.  Use the healthy leg to help rise up onto the balls of both feet, then lower weight only on the right / left leg again. Build up to 15 repetitions. Then progress to 3 consecutive sets of 15 repetitions.*  After completing the above exercise, complete the same exercise with a slight knee bend (about 30  degrees). Again, build up to 15 repetitions. Then progress to 3 consecutive sets of 15 repetitions.* Perform this exercise 2 times per day.  *When you easily complete 3 sets of 15, your physician, physical therapist or athletic trainer may advise you to add resistance by wearing a backpack filled with additional weight.  STRENGTH - Plantar Flexors, Seated   Sit on a chair that allows your feet to rest flat on the ground. If necessary, sit at the edge of the chair.  Keeping your toes firmly on the ground, lift your right / left heel as far as you can without increasing any discomfort in your ankle. Repeat 3 times. Complete this exercise 2 times a day.

## 2019-06-05 NOTE — Progress Notes (Signed)
Subjective: 43 year old female presents the office today for follow-up evaluation after undergoing left hallux partial nail avulsion.  She states that she just finished her Keflex.  She has been soaking Epson salts she stopped putting a bandage on the toe.  Denies any drainage or pus or any pain.  No swelling or redness.  Also she states that she still getting discomfort in the back of the heel.  She been using night splint, stretching, icing daily. Denies any systemic complaints such as fevers, chills, nausea, vomiting. No acute changes since last appointment, and no other complaints at this time.   Objective: AAO x3, NAD DP/PT pulses palpable bilaterally, CRT less than 3 seconds Status post partial nail avulsion of her left hallux toenail.  There appears to be healing well some granulation tissue is present otherwise there is almost healed.  There is no significant edema.  No significant erythema or ascending cellulitis.  No fluctuation Still tenderness palpation on the posterior aspect left heel on the history of bone spur as well as insertion of Achilles tendon.  Thompson test is negative.  No pain with lateral compression of the calcaneous. No other areas of tenderness. No pain with calf compression, swelling, warmth, erythema  Assessment: Left hallux status post partial nail avulsion healing well; posterior bone spur, insertional Achilles tendinitis  Plan: -All treatment options discussed with the patient including all alternatives, risks, complications.  -In regards to the partial nail avulsion on her continue soaking Epson salts, antibiotic ointment and a bandage.  Reviewed the wound culture with a particular staph infection but clinically appears to be resolved.  Continue to monitor for any reoccurrence. -Regards to the posterior bone spur, tendinitis prescribed Medrol Dosepak.  Discussed resting, icing daily.  Continue night splint.  Heel lift.  Continue supportive shoes arch  supports. -Patient encouraged to call the office with any questions, concerns, change in symptoms.   Return in about 4 weeks (around 07/01/2019).  Trula Slade DPM

## 2019-06-30 ENCOUNTER — Ambulatory Visit: Payer: No Typology Code available for payment source | Admitting: Podiatry

## 2019-07-05 ENCOUNTER — Ambulatory Visit
Admission: RE | Admit: 2019-07-05 | Discharge: 2019-07-05 | Disposition: A | Discharge status: Home / Self Care | Payer: No Typology Code available for payment source | Source: Ambulatory Visit | Attending: Obstetrics and Gynecology | Admitting: Obstetrics and Gynecology

## 2019-07-05 ENCOUNTER — Other Ambulatory Visit: Payer: Self-pay

## 2019-07-05 DIAGNOSIS — Z1231 Encounter for screening mammogram for malignant neoplasm of breast: Secondary | ICD-10-CM

## 2019-07-07 ENCOUNTER — Emergency Department (HOSPITAL_COMMUNITY): Payer: No Typology Code available for payment source

## 2019-07-07 ENCOUNTER — Other Ambulatory Visit: Payer: Self-pay

## 2019-07-07 ENCOUNTER — Emergency Department (HOSPITAL_COMMUNITY)
Admission: EM | Admit: 2019-07-07 | Discharge: 2019-07-07 | Disposition: A | Discharge status: Home / Self Care | Payer: No Typology Code available for payment source | Attending: Emergency Medicine | Admitting: Emergency Medicine

## 2019-07-07 ENCOUNTER — Encounter (HOSPITAL_COMMUNITY): Payer: Self-pay | Admitting: Emergency Medicine

## 2019-07-07 DIAGNOSIS — Y93I9 Activity, other involving external motion: Secondary | ICD-10-CM | POA: Insufficient documentation

## 2019-07-07 DIAGNOSIS — Z79899 Other long term (current) drug therapy: Secondary | ICD-10-CM | POA: Diagnosis not present

## 2019-07-07 DIAGNOSIS — Z23 Encounter for immunization: Secondary | ICD-10-CM | POA: Diagnosis not present

## 2019-07-07 DIAGNOSIS — I10 Essential (primary) hypertension: Secondary | ICD-10-CM | POA: Diagnosis not present

## 2019-07-07 DIAGNOSIS — S99911A Unspecified injury of right ankle, initial encounter: Secondary | ICD-10-CM | POA: Diagnosis present

## 2019-07-07 DIAGNOSIS — Y999 Unspecified external cause status: Secondary | ICD-10-CM | POA: Diagnosis not present

## 2019-07-07 DIAGNOSIS — S8261XA Displaced fracture of lateral malleolus of right fibula, initial encounter for closed fracture: Secondary | ICD-10-CM

## 2019-07-07 DIAGNOSIS — Y92410 Unspecified street and highway as the place of occurrence of the external cause: Secondary | ICD-10-CM | POA: Diagnosis not present

## 2019-07-07 LAB — CBC
HCT: 38.1 % (ref 36.0–46.0)
Hemoglobin: 12.3 g/dL (ref 12.0–15.0)
MCH: 28.8 pg (ref 26.0–34.0)
MCHC: 32.3 g/dL (ref 30.0–36.0)
MCV: 89.2 fL (ref 80.0–100.0)
Platelets: 236 10*3/uL (ref 150–400)
RBC: 4.27 MIL/uL (ref 3.87–5.11)
RDW: 13.2 % (ref 11.5–15.5)
WBC: 6.8 10*3/uL (ref 4.0–10.5)
nRBC: 0 % (ref 0.0–0.2)

## 2019-07-07 LAB — BASIC METABOLIC PANEL
Anion gap: 11 (ref 5–15)
BUN: 16 mg/dL (ref 6–20)
CO2: 23 mmol/L (ref 22–32)
Calcium: 8.9 mg/dL (ref 8.9–10.3)
Chloride: 103 mmol/L (ref 98–111)
Creatinine, Ser: 0.78 mg/dL (ref 0.44–1.00)
GFR calc Af Amer: >60 mL/min (ref >60)
GFR calc non Af Amer: >60 mL/min (ref >60)
Glucose, Bld: 136 mg/dL — ABNORMAL HIGH (ref 70–99)
Potassium: 3.6 mmol/L (ref 3.5–5.1)
Sodium: 137 mmol/L (ref 135–145)

## 2019-07-07 LAB — PROTIME-INR
INR: 1 (ref 0.8–1.2)
Prothrombin Time: 12.8 seconds (ref 11.4–15.2)

## 2019-07-07 LAB — TYPE AND SCREEN
ABO/RH(D): O NEG
Antibody Screen: NEGATIVE

## 2019-07-07 MED ORDER — BACITRACIN ZINC 500 UNIT/GM EX OINT
1.0000 "application " | TOPICAL_OINTMENT | Freq: Two times a day (BID) | CUTANEOUS | Status: DC
  Administered 2019-07-07: 1 via TOPICAL
  Filled 2019-07-07: qty 0.9

## 2019-07-07 MED ORDER — HYDROMORPHONE HCL 1 MG/ML IJ SOLN
1.0000 mg | Freq: Once | INTRAMUSCULAR | Status: AC
  Administered 2019-07-07: 1 mg via INTRAVENOUS
  Filled 2019-07-07: qty 1

## 2019-07-07 MED ORDER — IBUPROFEN 600 MG PO TABS: 600.0000 mg | ORAL_TABLET | Freq: Four times a day (QID) | ORAL | 0 refills | Status: DC | PRN

## 2019-07-07 MED ORDER — CEFAZOLIN SODIUM-DEXTROSE 2-4 GM/100ML-% IV SOLN
2.0000 g | Freq: Once | INTRAVENOUS | Status: AC
  Administered 2019-07-07: 2 g via INTRAVENOUS
  Filled 2019-07-07: qty 100

## 2019-07-07 MED ORDER — TETANUS-DIPHTH-ACELL PERTUSSIS 5-2.5-18.5 LF-MCG/0.5 IM SUSP
0.5000 mL | Freq: Once | INTRAMUSCULAR | Status: AC
  Administered 2019-07-07: 0.5 mL via INTRAMUSCULAR
  Filled 2019-07-07: qty 0.5

## 2019-07-07 MED ORDER — HYDROCODONE-ACETAMINOPHEN 5-325 MG PO TABS: 1.0000 | ORAL_TABLET | Freq: Four times a day (QID) | ORAL | 0 refills | Status: DC | PRN

## 2019-07-07 MED ORDER — CEFAZOLIN SODIUM-DEXTROSE 1-4 GM/50ML-% IV SOLN: 1.0000 g | Freq: Once | INTRAVENOUS | Status: DC
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
## 2019-07-07 NOTE — ED Notes (Signed)
Patient transported to X-ray 

## 2019-07-07 NOTE — Discharge Instructions (Signed)
You have a small chip fracture to the end of your ankle bone - Please use crutches to walk, Keep the splint in place Follow up with orthopedist in 1 week - see information attached. Ibuprofen for pain - hydrocodone for severe pain Ice and elevate the leg - see attached.

## 2019-07-07 NOTE — ED Notes (Signed)
Cleaned wound with Sur-clens. Placed bacitracin on wound and covered with Telfa. Covered telfa with medi-pore tape.
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
## 2019-07-07 NOTE — ED Notes (Signed)
Xray notified for STAT xray.

## 2019-07-12 ENCOUNTER — Telehealth: Payer: Self-pay | Admitting: Family

## 2019-07-12 ENCOUNTER — Other Ambulatory Visit: Payer: Self-pay | Admitting: Family

## 2019-07-12 DIAGNOSIS — S99929D Unspecified injury of unspecified foot, subsequent encounter: Secondary | ICD-10-CM

## 2019-07-12 NOTE — Telephone Encounter (Signed)
Please enter focus plan referral to Dr. Arther Abbott (ortho) [place].  Patient was seen in the ED and referred to Dr. Aline Brochure.  But insurance needs documentation that we are ok with this referral.

## 2019-07-13 ENCOUNTER — Telehealth: Payer: Self-pay | Admitting: Orthopedic Surgery

## 2019-07-13 ENCOUNTER — Encounter: Payer: Self-pay | Admitting: Orthopedic Surgery

## 2019-07-13 ENCOUNTER — Ambulatory Visit: Payer: No Typology Code available for payment source | Admitting: Orthopedic Surgery

## 2019-07-13 ENCOUNTER — Other Ambulatory Visit: Payer: Self-pay

## 2019-07-13 VITALS — BP 188/95 | HR 90 | Ht 61.0 in | Wt 225.0 lb

## 2019-07-13 DIAGNOSIS — S82891A Other fracture of right lower leg, initial encounter for closed fracture: Secondary | ICD-10-CM

## 2019-07-13 MED ORDER — HYDROCODONE-ACETAMINOPHEN 5-325 MG PO TABS: 1.0000 | ORAL_TABLET | Freq: Four times a day (QID) | ORAL | 0 refills | Status: DC | PRN

## 2019-07-13 NOTE — Telephone Encounter (Signed)
As per request, work note faxed to employer (authorization on file) / fax [phone], to attention Katherina Mires, [place].

## 2019-07-13 NOTE — Patient Instructions (Signed)
Elevate   No weight bearing   OOW note
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
## 2019-07-14 ENCOUNTER — Other Ambulatory Visit: Payer: Self-pay | Admitting: Internal Medicine

## 2019-07-18 ENCOUNTER — Ambulatory Visit (INDEPENDENT_AMBULATORY_CARE_PROVIDER_SITE_OTHER): Payer: No Typology Code available for payment source | Admitting: Orthopedic Surgery

## 2019-07-18 ENCOUNTER — Encounter: Payer: Self-pay | Admitting: Orthopedic Surgery

## 2019-07-18 ENCOUNTER — Other Ambulatory Visit: Payer: Self-pay

## 2019-07-18 VITALS — BP 136/87 | HR 86 | Temp 97.3°F | Ht 61.0 in

## 2019-07-18 DIAGNOSIS — S82891D Other fracture of right lower leg, subsequent encounter for closed fracture with routine healing: Secondary | ICD-10-CM

## 2019-07-18 DIAGNOSIS — M25511 Pain in right shoulder: Secondary | ICD-10-CM
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
## 2019-07-18 NOTE — Patient Instructions (Signed)
Elevate the foot

## 2019-07-21 DIAGNOSIS — S82891A Other fracture of right lower leg, initial encounter for closed fracture: Secondary | ICD-10-CM | POA: Insufficient documentation

## 2019-07-25 ENCOUNTER — Ambulatory Visit (INDEPENDENT_AMBULATORY_CARE_PROVIDER_SITE_OTHER): Payer: No Typology Code available for payment source | Admitting: Orthopedic Surgery

## 2019-07-25 ENCOUNTER — Encounter: Payer: Self-pay | Admitting: Orthopedic Surgery

## 2019-07-25 ENCOUNTER — Other Ambulatory Visit: Payer: Self-pay

## 2019-07-25 DIAGNOSIS — S82891D Other fracture of right lower leg, subsequent encounter for closed fracture with routine healing: Secondary | ICD-10-CM | POA: Diagnosis not present
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
## 2019-07-25 NOTE — Patient Instructions (Signed)
OOW CONTINUES

## 2019-08-01 ENCOUNTER — Other Ambulatory Visit: Payer: Self-pay

## 2019-08-01 ENCOUNTER — Ambulatory Visit (INDEPENDENT_AMBULATORY_CARE_PROVIDER_SITE_OTHER): Payer: No Typology Code available for payment source | Admitting: Orthopedic Surgery

## 2019-08-01 ENCOUNTER — Encounter: Payer: Self-pay | Admitting: Orthopedic Surgery

## 2019-08-01 DIAGNOSIS — S82891D Other fracture of right lower leg, subsequent encounter for closed fracture with routine healing: Secondary | ICD-10-CM

## 2019-08-01 MED ORDER — HYDROCODONE-ACETAMINOPHEN 5-325 MG PO TABS: 1.0000 | ORAL_TABLET | Freq: Every day | ORAL | 0 refills | Status: AC

## 2019-08-01 NOTE — Patient Instructions (Signed)
OOW 17 MORE DAYS   FU IN 2 WEEKS   NEOSPORIN TO WOUND  OK TO SHOWER   REFILLED NORCO

## 2019-08-01 NOTE — Progress Notes (Signed)
Chief Complaint  Patient presents with  . Ankle Injury    07/07/2019 skin check. has blisters    Day 25 injury  I reviewed the x-ray again today there is a small fracture at the tip of the fibula  She still has a lot of soft tissue swelling and probably a hematoma if not some torn ankle ligaments  The blisters have improved and are now gone she has a small dime size wound superficial lateral ankle covered with a dressing there are no signs of infection she can move her foot slightly about 10 degrees total, 5 in each direction  Refill of hydrocodone to take at night using Tylenol during the day  We have allowed her to be weightbearing as tolerated in the boot  Should be out of work for another 17 days  No dressing  Okay to shower  Encounter Diagnosis  Name Primary?  . Closed fracture of right ankle with routine healing, subsequent encounter 07/07/2019 Yes   Meds ordered this encounter  Medications  . HYDROcodone-acetaminophen (NORCO/VICODIN) 5-325 MG tablet    Sig: Take 1 tablet by mouth at bedtime for 7 days.    Dispense:  7 tablet    Refill:  0     FU 14 DAYS

## 2019-08-15 ENCOUNTER — Encounter: Payer: Self-pay | Admitting: Orthopedic Surgery

## 2019-08-15 ENCOUNTER — Other Ambulatory Visit: Payer: Self-pay

## 2019-08-15 ENCOUNTER — Ambulatory Visit (INDEPENDENT_AMBULATORY_CARE_PROVIDER_SITE_OTHER): Payer: No Typology Code available for payment source | Admitting: Orthopedic Surgery

## 2019-08-15 DIAGNOSIS — S82891D Other fracture of right lower leg, subsequent encounter for closed fracture with routine healing: Secondary | ICD-10-CM

## 2019-08-15 NOTE — Patient Instructions (Signed)
Neosporin  wbat with boot plus 2 crutches  Start PT
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
## 2019-08-22 ENCOUNTER — Encounter (HOSPITAL_COMMUNITY): Payer: Self-pay | Admitting: Physical Therapy

## 2019-08-22 ENCOUNTER — Other Ambulatory Visit: Payer: Self-pay

## 2019-08-22 ENCOUNTER — Ambulatory Visit (HOSPITAL_COMMUNITY): Payer: No Typology Code available for payment source | Attending: Orthopedic Surgery | Admitting: Physical Therapy

## 2019-08-22 DIAGNOSIS — M25671 Stiffness of right ankle, not elsewhere classified: Secondary | ICD-10-CM | POA: Diagnosis not present

## 2019-08-22 DIAGNOSIS — R2689 Other abnormalities of gait and mobility: Secondary | ICD-10-CM | POA: Diagnosis present

## 2019-08-22 DIAGNOSIS — M25571 Pain in right ankle and joints of right foot: Secondary | ICD-10-CM

## 2019-08-22 NOTE — Patient Instructions (Addendum)
Dorsiflexion: Isometric    With ball or rolled pillow between feet, squeeze feet together. Hold _5___ seconds. Relax. Repeat 10____ times per set. Do _1___ sets per session. Do __2__ sessions per day.  http://orth.exer.us/2   Copyright  VHI. All rights reserved.  Eversion: Isometric    Press outer border of right foot into ball or rolled pillow against wall. Hold __5__ seconds. Relax. Repeat _10 ___ times per set. Do ____ sets per session. Do ____ sessions per day.  http://orth.exer.us/4   Copyright  VHI. All rights reserved.  Inversion: Isometric    Press inner borders of feet into ball or rolled pillow between feet. Hold _5___ seconds. Relax. Repeat 10____ times per set. Do _1___ sets per session. Do _2___ sessions per day.  http://orth.exer.us/6   Copyright  VHI. All rights reserved.  Plantar Flexion: Isometric    Press left foot into ball or rolled pillow against wall. Hold __5__ seconds. Relax. Repeat __10__ times per set. Do __1__ sets per session. Do _3__ sessions per day.  http://orth.exer.us/0   Copyright  VHI. All rights reserved.  Toe Raise (Sitting)    Raise toes, keeping heels on floor. Now raise your heels  Repeat 10____ times per set. Do ___1_ sets per session. Do __2__ sessions per day.  http://orth.exer.us/46   Copyright  VHI. All rights reserved.
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
## 2019-08-25 ENCOUNTER — Encounter (HOSPITAL_COMMUNITY): Payer: Self-pay

## 2019-08-25 ENCOUNTER — Ambulatory Visit (HOSPITAL_COMMUNITY): Payer: No Typology Code available for payment source | Attending: Orthopedic Surgery

## 2019-08-25 ENCOUNTER — Other Ambulatory Visit: Payer: Self-pay

## 2019-08-25 DIAGNOSIS — R2689 Other abnormalities of gait and mobility: Secondary | ICD-10-CM | POA: Insufficient documentation

## 2019-08-25 DIAGNOSIS — M25571 Pain in right ankle and joints of right foot: Secondary | ICD-10-CM | POA: Insufficient documentation

## 2019-08-25 DIAGNOSIS — M25671 Stiffness of right ankle, not elsewhere classified: Secondary | ICD-10-CM | POA: Insufficient documentation

## 2019-08-25 NOTE — Therapy (Signed)
[place], [place], [address] Phone: [phone]   Fax:  [phone]  Physical Therapy Treatment  Patient Details  Name: Jenny Parsons MRN: [id number] Date of Birth: [birth year] Referring Provider (PT): Arther Abbott    Encounter Date: 08/25/2019  PT End of Session - 08/25/19 0841    Visit Number  2    Number of Visits  12    Date for PT Re-Evaluation  10/03/19    Authorization Type      Authorization Time Period  11/30-->10/03/19    Authorization - Visit Number  2    Authorization - Number of Visits  12    PT Start Time  [phone]    PT Stop Time  0915    PT Time Calculation (min)  38 min    Activity Tolerance  Patient tolerated treatment well    Behavior During Therapy  [facility] for tasks assessed/performed       Past Medical History:  Diagnosis Date  . Hypertension     Past Surgical History:  Procedure Laterality Date  . TUBAL LIGATION      There were no vitals filed for this visit.  Subjective Assessment - 08/25/19 0840    Subjective  Pt stated ankle edema has reduced, order compression hose and expect them to arrive later today.  Reports compliance with HEP without questions.  No reoprts of pain.    Pertinent History  Lt heel spur which is acting up now that she is putting the majority of her weight on her LT LE.    Patient Stated Goals  Off crutch and out of the boot and get back to work.    Currently in Pain?  No/denies         [facility] PT Assessment - 08/25/19 0001      Assessment   Medical Diagnosis  Rt ankle fx    Referring Provider (PT)  Arther Abbott     Onset Date/Surgical Date  07/07/19    Next MD Visit  08/28/2019    Prior Therapy  none      Precautions   Precautions  None      Restrictions   Weight Bearing Restrictions  Yes    RUE Weight Bearing  Weight bearing as tolerated                   [facility] Adult PT Treatment/Exercise - 08/25/19 0001      Exercises   Exercises  Ankle      Manual Therapy   Manual Therapy  Edema management    Manual therapy comments  Manual complete separate than rest of tx    Edema Management  Retrograde massage with LE elevated      Ankle Exercises: Stretches   Gastroc Stretch  3 reps;30 seconds      Ankle Exercises: Seated   Towel Crunch  2 reps    Towel Inversion/Eversion  3 reps    BAPS  Level 2;10 reps    BAPS Limitations  DF/PF, Inv/Ev; CW/CCW             PT Education - 08/25/19 0846    Education Details  Reviewed goals, assured compliance with HEP.  DIscussed benefits with compression hose, butler for assistance and RICE techniques for edema control.    Person(s) Educated  Patient    Methods  Explanation;Demonstration    Comprehension  Verbalized understanding       PT Short Term  Goals - 08/22/19 1220      PT SHORT TERM GOAL #1   Title  PT to be I in HEP to improve ankle strength by 1/2 grade to allow pt to be able to walk with her camboot for 40 minutes without increased pain.    Time  3    Period  Weeks    Status  New    Target Date  09/12/19      PT SHORT TERM GOAL #2   Title  PT to be walking in her home without the camboot or crutches.    Time  3    Period  Weeks    Status  New      PT SHORT TERM GOAL #3   Title  PT to be able to stand for 30 minutes without increased pain to be able to make a quick meal.    Time  3    Period  Weeks    Status  New        PT Long Term Goals - 08/22/19 1223      PT LONG TERM GOAL #1   Title  Pt pain in right ankle to be no greater than a 3/10 to allow her to walk outside without an assistive device or her camboot on.    Time  6    Period  Weeks    Status  New    Target Date  10/03/19      PT LONG TERM GOAL #2   Title  PT ankle strength to be at least 4+/5 to allow pt to go up and down 4 steps in a reciprocal manner with 1 hand rail.    Time  6    Period  Weeks    Status  New      PT LONG TERM GOAL #3   Title  PT to be  able to tolerate walking /stand for an hour at a time inside to allow pt to complete shopping in comfort and return to work.    Time  6    Period  Weeks    Status  New      PT LONG TERM GOAL #4   Title  PT to be able to single leg stance for at least 30 seconds bilaterally to reduce risk of reinjuring    Time  6    Period  Weeks    Status  New            Plan - 08/25/19 8185    Clinical Impression Statement  Reviewed goals and assured compliance with HEP.  Session focus on ankle mobility and intrinsic strengthening, pt able to complete good form and mechanics with all new exercises.  Edema present especially lateral aspect, EOS with manual retrograde massage with LE elevated for edema control.  Reviewed RICE techniques with additional ankle pumps for mobility and pain following session.  Pt reports she is to receive the compression garments in mail later today.  Reviewed donning procedure and educated use of butler to assist.  No reports of increased pain through session.  Pt encouraged to bring a pair of tennis shoes with her next session.    Examination-Activity Limitations  Caring for Others;Carry;Locomotion Level;Squat;Stairs;Stand    Examination-Participation Restrictions  Cleaning;Community Activity;Driving;Laundry;Shop;Meal Prep    Stability/Clinical Decision Making  Stable/Uncomplicated    Clinical Decision Making  Low    Rehab Potential  Good    PT Frequency  2x / week    PT  Duration  6 weeks    PT Treatment/Interventions  Manual techniques;Manual lymph drainage;Compression bandaging;Therapeutic activities;Therapeutic exercise;Functional mobility training;Stair training;Gait training    PT Next Visit Plan  Begin heel/toe raises, increase to L3 BAPS and slant board.  Manual for edema control and answer any questions wiht new compression garments.    PT Home Exercise Plan  Isometrics       Patient will benefit from skilled therapeutic intervention in order to improve the  following deficits and impairments:  Abnormal gait, Decreased activity tolerance, Decreased balance, Decreased range of motion, Decreased strength, Difficulty walking, Decreased skin integrity, Pain  Visit Diagnosis: Pain in right ankle and joints of right foot  Other abnormalities of gait and mobility  Stiffness of right ankle, not elsewhere classified     Problem List Patient Active Problem List   Diagnosis Date Noted  . Closed fracture of right ankle 07/21/2019  . Trapezius muscle strain, right, initial encounter 05/26/2019  . Cervical paraspinal muscle spasm 05/20/2017  . Allergic rhinitis 02/02/2017  . Depression with anxiety 11/06/2016  . Snoring 11/05/2016  . Essential hypertension, benign 12/22/2011  . Routine general medical examination at a health care facility 12/22/2011  . Hypertension 05/01/2011   Becky Saxasey Pami Wool, LPTA; CBIS [phone]  Juel BurrowCockerham, Dathan Attia Jo 08/25/2019, 11:22 AM  [facility] [address] [place], [place], [address] Phone: [phone]   Fax:  [phone]  Name: Jenny Parsons MRN: [id number] Date of Birth: Feb 03, 1976

## 2019-08-26 ENCOUNTER — Other Ambulatory Visit: Payer: Self-pay | Admitting: Family

## 2019-08-26 ENCOUNTER — Encounter: Payer: Self-pay | Admitting: Family

## 2019-08-26 ENCOUNTER — Ambulatory Visit (INDEPENDENT_AMBULATORY_CARE_PROVIDER_SITE_OTHER): Payer: No Typology Code available for payment source | Admitting: Family

## 2019-08-26 VITALS — BP 130/74 | HR 51 | Temp 98.2°F | Ht 61.0 in | Wt 222.4 lb

## 2019-08-26 DIAGNOSIS — I10 Essential (primary) hypertension: Secondary | ICD-10-CM | POA: Diagnosis not present

## 2019-08-26 DIAGNOSIS — F418 Other specified anxiety disorders: Secondary | ICD-10-CM

## 2019-08-26 DIAGNOSIS — Z23 Encounter for immunization: Secondary | ICD-10-CM | POA: Diagnosis not present

## 2019-08-26 MED ORDER — HYDROCHLOROTHIAZIDE 25 MG PO TABS: 25.0000 mg | ORAL_TABLET | Freq: Every day | ORAL | 3 refills | Status: DC
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
## 2019-08-29 ENCOUNTER — Encounter: Payer: Self-pay | Admitting: Orthopedic Surgery

## 2019-08-29 ENCOUNTER — Ambulatory Visit (INDEPENDENT_AMBULATORY_CARE_PROVIDER_SITE_OTHER): Payer: No Typology Code available for payment source | Admitting: Orthopedic Surgery

## 2019-08-29 ENCOUNTER — Other Ambulatory Visit: Payer: Self-pay

## 2019-08-29 ENCOUNTER — Telehealth (HOSPITAL_COMMUNITY): Payer: Self-pay

## 2019-08-29 VITALS — BP 154/100 | HR 76 | Temp 97.0°F | Ht 61.0 in | Wt 222.0 lb

## 2019-08-29 DIAGNOSIS — S82891D Other fracture of right lower leg, subsequent encounter for closed fracture with routine healing: Secondary | ICD-10-CM

## 2019-08-29 NOTE — Telephone Encounter (Signed)
pt cancelled this appt on 08/30/2019 due to she will be going back to work tomorrow.
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
## 2019-08-29 NOTE — Patient Instructions (Signed)
[place] X 4 WEEKS   RETURN TO WORK DATE: DEC 8   SOAK FOOT IN WARM WATER EPSOM SALT

## 2019-08-30 ENCOUNTER — Ambulatory Visit (HOSPITAL_COMMUNITY): Payer: No Typology Code available for payment source

## 2019-09-01 ENCOUNTER — Other Ambulatory Visit: Payer: Self-pay

## 2019-09-01 ENCOUNTER — Encounter (HOSPITAL_COMMUNITY): Payer: Self-pay

## 2019-09-01 ENCOUNTER — Ambulatory Visit (HOSPITAL_COMMUNITY): Payer: No Typology Code available for payment source

## 2019-09-01 DIAGNOSIS — M25571 Pain in right ankle and joints of right foot: Secondary | ICD-10-CM

## 2019-09-01 DIAGNOSIS — R2689 Other abnormalities of gait and mobility: Secondary | ICD-10-CM

## 2019-09-01 DIAGNOSIS — M25671 Stiffness of right ankle, not elsewhere classified: Secondary | ICD-10-CM

## 2019-09-01 NOTE — Therapy (Signed)
[place] [place] [facility] [address] [place], [place], [address] Phone: [phone]   Fax:  [phone]  Physical Therapy Treatment  Patient Details  Name: Jenny Parsons MRN: [id number] Date of Birth: 1976-07-04 Referring Provider (PT): Fuller Canada    Encounter Date: 09/01/2019  PT End of Session - 09/01/19 1843    Visit Number  3    Number of Visits  12    Date for PT Re-Evaluation  10/03/19    Authorization Type  [place]    Authorization Time Period  11/30-->10/03/19    Authorization - Visit Number  3    Authorization - Number of Visits  12    PT Start Time  1751    PT Stop Time  1836    PT Time Calculation (min)  45 min    Activity Tolerance  Patient tolerated treatment well    Behavior During Therapy  [facility] for tasks assessed/performed       Past Medical History:  Diagnosis Date  . Hypertension     Past Surgical History:  Procedure Laterality Date  . TUBAL LIGATION      There were no vitals filed for this visit.  Subjective Assessment - 09/01/19 1748    Subjective  Pt reports she RTW this Tuesday, reports she was really tired but making improvements every day back to work.  Wears her compression hose while working,  Foot is very swollen todayRemoves boot around the house and while driving.  No reoprts of pain.         [facility] PT Assessment - 09/01/19 0001      Assessment   Medical Diagnosis  Rt ankle fx    Referring Provider (PT)  Fuller Canada     Onset Date/Surgical Date  07/07/19    Next MD Visit  09/26/19    Prior Therapy  none      AROM   Right Ankle Dorsiflexion  11   was 10   Right Ankle Plantar Flexion  48   was 40   Right Ankle Inversion  35   was 30   Right Ankle Eversion  12   was 10                  [facility] Adult PT Treatment/Exercise - 09/01/19 0001      Exercises   Exercises  Ankle      Manual Therapy   Manual Therapy  Edema management    Manual therapy comments  Manual  complete separate than rest of tx    Edema Management  Retrograde massage with LE elevated      Ankle Exercises: Standing   Rocker Board  2 minutes    Rocker Board Limitations  lateral and DF/PF    Heel Raises  10 reps    Toe Raise  10 reps    Other Standing Ankle Exercises  268ft gait training for heel to toe and equal stride length      Ankle Exercises: Seated   Towel Crunch  2 reps    Towel Inversion/Eversion  3 reps    BAPS  Sitting;Level 3;10 reps    BAPS Limitations  DF/PF, Inv/Ev; CW/CCW    Other Seated Ankle Exercises  ankle pumps      Ankle Exercises: Supine   Other Supine Ankle Exercises  ankle pumps wiht LE elevated               PT Short Term Goals - 08/22/19 1220  PT SHORT TERM GOAL #1   Title  PT to be I in HEP to improve ankle strength by 1/2 grade to allow pt to be able to walk with her camboot for 40 minutes without increased pain.    Time  3    Period  Weeks    Status  New    Target Date  09/12/19      PT SHORT TERM GOAL #2   Title  PT to be walking in her home without the camboot or crutches.    Time  3    Period  Weeks    Status  New      PT SHORT TERM GOAL #3   Title  PT to be able to stand for 30 minutes without increased pain to be able to make a quick meal.    Time  3    Period  Weeks    Status  New        PT Long Term Goals - 08/22/19 1223      PT LONG TERM GOAL #1   Title  Pt pain in right ankle to be no greater than a 3/10 to allow her to walk outside without an assistive device or her camboot on.    Time  6    Period  Weeks    Status  New    Target Date  10/03/19      PT LONG TERM GOAL #2   Title  PT ankle strength to be at least 4+/5 to allow pt to go up and down 4 steps in a reciprocal manner with 1 hand rail.    Time  6    Period  Weeks    Status  New      PT LONG TERM GOAL #3   Title  PT to be able to tolerate walking /stand for an hour at a time inside to allow pt to complete shopping in comfort and return to  work.    Time  6    Period  Weeks    Status  New      PT LONG TERM GOAL #4   Title  PT to be able to single leg stance for at least 30 seconds bilaterally to reduce risk of reinjuring    Time  6    Period  Weeks    Status  New            Plan - 09/01/19 1844    Clinical Impression Statement  Pt arrived with increased edema foot/ankle.  Began session with retrograde massage for edema control prior ROM based ankle exercises.  Pt brought a pair of tennis shoes with her this session.  Began heel/toe raises, rockerboard and gait training in shoes with min cueing for heel to toe mechanics, able to demonstrate good mechanics.  ROM improving all directions, most limited with dorsiflexion.    Examination-Activity Limitations  Caring for Others;Carry;Locomotion Level;Squat;Stairs;Stand    Examination-Participation Restrictions  Cleaning;Community Activity;Driving;Laundry;Shop;Meal Prep    Stability/Clinical Decision Making  Stable/Uncomplicated    Clinical Decision Making  Low    Rehab Potential  Good    PT Frequency  2x / week    PT Duration  6 weeks    PT Treatment/Interventions  Manual techniques;Manual lymph drainage;Compression bandaging;Therapeutic activities;Therapeutic exercise;Functional mobility training;Stair training;Gait training    PT Next Visit Plan  Next session begin L3 BAPS standing, add slant board stretches.  Continue heel/toe raises, begin balance activities.  Manual for edema control.  PT Home Exercise Plan  Isometrics; gastroc stretch against wall.09/01/19  Heel/toe raises.       Patient will benefit from skilled therapeutic intervention in order to improve the following deficits and impairments:  Abnormal gait, Decreased activity tolerance, Decreased balance, Decreased range of motion, Decreased strength, Difficulty walking, Decreased skin integrity, Pain  Visit Diagnosis: Other abnormalities of gait and mobility  Stiffness of right ankle, not elsewhere  classified  Pain in right ankle and joints of right foot     Problem List Patient Active Problem List   Diagnosis Date Noted  . Closed fracture of right ankle 07/21/2019  . Trapezius muscle strain, right, initial encounter 05/26/2019  . Cervical paraspinal muscle spasm 05/20/2017  . Allergic rhinitis 02/02/2017  . Depression with anxiety 11/06/2016  . Snoring 11/05/2016  . Essential hypertension, benign 12/22/2011  . Routine general medical examination at a health care facility 12/22/2011  . Hypertension 05/01/2011   Ihor Austin, [place]; [place]  Aldona Lento 09/01/2019, 6:51 PM  [place] [address] [place], [place], [address] Phone: [phone]   Fax:  [phone]  Name: Jenny Parsons MRN: [id number] Date of Birth: 1975-11-14

## 2019-09-01 NOTE — Patient Instructions (Signed)
Toe / Heel Raise (Standing)    Standing with support, raise heels, then rock back on heels and raise toes. Repeat 15 times.  Copyright  VHI. All rights reserved.        

## 2019-09-06 ENCOUNTER — Telehealth (HOSPITAL_COMMUNITY): Payer: Self-pay

## 2019-09-06 ENCOUNTER — Ambulatory Visit (HOSPITAL_COMMUNITY): Payer: No Typology Code available for payment source

## 2019-09-06 NOTE — Telephone Encounter (Signed)
No show #1.  Called and left message concerning missed apt today.  Reminded next apt date and time with contact number for questions and/or cancel/reschedule next apt.  Did included no show policy in voice message.    [address] [place]; [facility] [phone]

## 2019-09-08 ENCOUNTER — Encounter (HOSPITAL_COMMUNITY): Payer: Self-pay

## 2019-09-08 ENCOUNTER — Ambulatory Visit (HOSPITAL_COMMUNITY): Payer: No Typology Code available for payment source

## 2019-09-08 ENCOUNTER — Other Ambulatory Visit: Payer: Self-pay

## 2019-09-08 DIAGNOSIS — M25571 Pain in right ankle and joints of right foot: Secondary | ICD-10-CM | POA: Diagnosis not present

## 2019-09-08 DIAGNOSIS — M25671 Stiffness of right ankle, not elsewhere classified: Secondary | ICD-10-CM

## 2019-09-08 DIAGNOSIS — R2689 Other abnormalities of gait and mobility: Secondary | ICD-10-CM

## 2019-09-08 NOTE — Therapy (Signed)
[place], [place], [address] Phone: [phone]   Fax:  [phone]  Physical Therapy Treatment  Patient Details  Name: Jenny Parsons MRN: [id number] Date of Birth: [birth year] Referring Provider (PT): Arther Abbott    Encounter Date: 09/08/2019  PT End of Session - 09/08/19 1707    Visit Number  4    Number of Visits  12    Date for PT Re-Evaluation  10/03/19    Authorization Type  [place]    Authorization Time Period  11/30-->10/03/19    Authorization - Visit Number  4    Authorization - Number of Visits  12    PT Start Time  3151    PT Stop Time  7616    PT Time Calculation (min)  47 min    Activity Tolerance  Patient tolerated treatment well    Behavior During Therapy  [facility] for tasks assessed/performed       Past Medical History:  Diagnosis Date  . Hypertension     Past Surgical History:  Procedure Laterality Date  . TUBAL LIGATION      There were no vitals filed for this visit.  Subjective Assessment - 09/08/19 1705    Subjective  Pt reports a busy day of work today.  Compression garment arrived in mail and reports decreased edema.    Pertinent History  Lt heel spur which is acting up now that she is putting the majority of her weight on her LT LE.    Patient Stated Goals  Off crutch and out of the boot and get back to work.    Currently in Pain?  No/denies                       [facility] Adult PT Treatment/Exercise - 09/08/19 0001      Exercises   Exercises  Ankle      Manual Therapy   Manual Therapy  Edema management    Manual therapy comments  Manual complete separate than rest of tx    Edema Management  Retrograde massage with LE elevated      Ankle Exercises: Stretches   Slant Board Stretch  3 reps;30 seconds      Ankle Exercises: Standing   BAPS  Standing;Level 3;10 reps    BAPS Limitations  DF/PF; Inv/Ev; CW/CCW    SLS  Lt 41"    Rocker Board  2  minutes    Rocker Board Limitations  lateral and DF/PF    Heel Raises  10 reps    Heel Raises Limitations  2sets    Toe Raise  10 reps    Toe Raise Limitations  2 sets    Other Standing Ankle Exercises  269ft gait training for heel to toe and equal stride length      Ankle Exercises: Supine   T-Band  GTB 10x each direction               PT Short Term Goals - 09/08/19 1718      PT SHORT TERM GOAL #1   Title  PT to be I in HEP to improve ankle strength by 1/2 grade to allow pt to be able to walk with her camboot for 40 minutes without increased pain.    Baseline  09/08/19:  Reports compliance wiht HEP everyother day, reports ability to walk for 15 minutes without increased pain    Status  On-going  PT SHORT TERM GOAL #2   Title  PT to be walking in her home without the camboot or crutches.    Baseline  09/08/19:  Currently wears camboot for work only, no AD    Status  Achieved        PT Long Term Goals - 08/22/19 1223      PT LONG TERM GOAL #1   Title  Pt pain in right ankle to be no greater than a 3/10 to allow her to walk outside without an assistive device or her camboot on.    Time  6    Period  Weeks    Status  New    Target Date  10/03/19      PT LONG TERM GOAL #2   Title  PT ankle strength to be at least 4+/5 to allow pt to go up and down 4 steps in a reciprocal manner with 1 hand rail.    Time  6    Period  Weeks    Status  New      PT LONG TERM GOAL #3   Title  PT to be able to tolerate walking /stand for an hour at a time inside to allow pt to complete shopping in comfort and return to work.    Time  6    Period  Weeks    Status  New      PT LONG TERM GOAL #4   Title  PT to be able to single leg stance for at least 30 seconds bilaterally to reduce risk of reinjuring    Time  6    Period  Weeks    Status  New            Plan - 09/08/19 1745    Clinical Impression Statement  Pt progressing well towards POC.  Progressed to BAPS standing,  added slant board and theraband for ankle strengthening.  Pt able to complete all exercises with no reports of pain, min cueing for form with BAPS.  Edema reduced with compression hose.  Added theraband for ankle strengthening HEP.    Examination-Activity Limitations  Caring for Others;Carry;Locomotion Level;Squat;Stairs;Stand    Examination-Participation Restrictions  Cleaning;Community Activity;Driving;Laundry;Shop;Meal Prep    Stability/Clinical Decision Making  Stable/Uncomplicated    Clinical Decision Making  Low    Rehab Potential  Good    PT Frequency  2x / week    PT Duration  6 weeks    PT Treatment/Interventions  Manual techniques;Manual lymph drainage;Compression bandaging;Therapeutic activities;Therapeutic exercise;Functional mobility training;Stair training;Gait training    PT Next Visit Plan  Progress standing exercises, add step ups and continue balance activities.    PT Home Exercise Plan  Isometrics; gastroc stretch against wall.09/01/19  Heel/toe raises.; 12/17: theraband all directions       Patient will benefit from skilled therapeutic intervention in order to improve the following deficits and impairments:  Abnormal gait, Decreased activity tolerance, Decreased balance, Decreased range of motion, Decreased strength, Difficulty walking, Decreased skin integrity, Pain  Visit Diagnosis: Stiffness of right ankle, not elsewhere classified  Pain in right ankle and joints of right foot  Other abnormalities of gait and mobility     Problem List Patient Active Problem List   Diagnosis Date Noted  . Closed fracture of right ankle 07/21/2019  . Trapezius muscle strain, right, initial encounter 05/26/2019  . Cervical paraspinal muscle spasm 05/20/2017  . Allergic rhinitis 02/02/2017  . Depression with anxiety 11/06/2016  . Snoring 11/05/2016  .  Essential hypertension, benign 12/22/2011  . Routine general medical examination at a health care facility 12/22/2011  .  Hypertension 05/01/2011   Becky Saxasey Ziasia Lenoir, LPTA; [facility] [phone]  Juel BurrowCockerham, Zidane Renner Jo 09/08/2019, 6:00 PM  [place] [facility] [address] [place], [place], [address] Phone: [phone]   Fax:  [phone]  Name: Fransisco HertzKimberly W Patterson MRN: [id number] Date of Birth: 1976/01/15

## 2019-09-08 NOTE — Patient Instructions (Signed)
Inversion: Resisted   Cross legs with right leg underneath, foot in tubing loop. Hold tubing around other foot to resist and turn foot in. Repeat ____ times per set. Do ____ sets per session. Do ____ sessions per day.  http://orth.exer.us/12   Copyright  VHI. All rights reserved.  Eversion: Resisted   With right foot in tubing loop, hold tubing around other foot to resist and turn foot out. Repeat ____ times per set. Do ____ sets per session. Do ____ sessions per day.  http://orth.exer.us/14   Copyright  VHI. All rights reserved.  Plantar Flexion: Resisted   Anchor behind, tubing around left foot, press down. Repeat ____ times per set. Do ____ sets per session. Do ____ sessions per day.  http://orth.exer.us/10   Copyright  VHI. All rights reserved.  Dorsiflexion: Resisted   Facing anchor, tubing around left foot, pull toward face.  Repeat ____ times per set. Do ____ sets per session. Do ____ sessions per day.  http://orth.exer.us/8   Copyright  VHI. All rights reserved.    

## 2019-09-13 ENCOUNTER — Telehealth (HOSPITAL_COMMUNITY): Payer: Self-pay | Admitting: Physical Therapy

## 2019-09-13 ENCOUNTER — Ambulatory Visit (HOSPITAL_COMMUNITY): Payer: No Typology Code available for payment source | Admitting: Physical Therapy

## 2019-09-13 NOTE — Telephone Encounter (Signed)
pt called to cancel her appt today due to she has to take her son in for a covid test. per mom she will take a rapid test and get her results back in a few then she will call us back to let us know the results regarding tomorrow's appt.

## 2019-09-14 ENCOUNTER — Other Ambulatory Visit: Payer: Self-pay

## 2019-09-14 ENCOUNTER — Encounter (HOSPITAL_COMMUNITY): Payer: Self-pay | Admitting: Physical Therapy

## 2019-09-14 ENCOUNTER — Ambulatory Visit (HOSPITAL_COMMUNITY): Payer: No Typology Code available for payment source | Admitting: Physical Therapy

## 2019-09-14 DIAGNOSIS — M25571 Pain in right ankle and joints of right foot: Secondary | ICD-10-CM | POA: Diagnosis not present

## 2019-09-14 DIAGNOSIS — R2689 Other abnormalities of gait and mobility: Secondary | ICD-10-CM

## 2019-09-14 DIAGNOSIS — M25671 Stiffness of right ankle, not elsewhere classified: Secondary | ICD-10-CM

## 2019-09-14 NOTE — Patient Instructions (Signed)
Access Code: [email or web address]  URL: [email or web address]  Date: 09/14/2019  Prepared by: Mitzi Hansen Ayah Cozzolino   Exercises Lateral Step Down - 10 reps - 2 sets - 1x daily - 7x weekly

## 2019-09-14 NOTE — Therapy (Signed)
[place], [place], [address] Phone: [phone]   Fax:  [phone]  Physical Therapy Treatment  Patient Details  Name: Jenny Parsons MRN: [id number] Date of Birth: 09/01/1976 Referring Provider (PT): Arther Abbott    Encounter Date: 09/14/2019  PT End of Session - 09/14/19 1435    Visit Number  5    Number of Visits  12    Date for PT Re-Evaluation  10/03/19    Authorization Type  [place]    Authorization Time Period  11/30-->10/03/19    Authorization - Visit Number  5    Authorization - Number of Visits  12    PT Start Time  2376    PT Stop Time  1430    PT Time Calculation (min)  42 min    Activity Tolerance  Patient tolerated treatment well    Behavior During Therapy  [facility] for tasks assessed/performed       Past Medical History:  Diagnosis Date  . Hypertension     Past Surgical History:  Procedure Laterality Date  . TUBAL LIGATION      There were no vitals filed for this visit.  Subjective Assessment - 09/14/19 1349    Subjective  Patient reports she has been wearing her tennis shoes but has been working boot at work. She is still having a lot of swelling. She is mostly sore and sometimes painful at the end of the day. She uses compression hose which seem to make it feel better.    Pertinent History  Lt heel spur which is acting up now that she is putting the majority of her weight on her LT LE.    Patient Stated Goals  Off crutch and out of the boot and get back to work.    Currently in Pain?  No/denies         [facility] PT Assessment - 09/14/19 0001      AROM   Right Ankle Dorsiflexion  10    Right Ankle Plantar Flexion  48                   [facility] Adult PT Treatment/Exercise - 09/14/19 0001      Manual Therapy   Manual Therapy  Joint mobilization;Edema management    Manual therapy comments  Manual complete separate than rest of tx    Edema Management  Retrograde  massage with LE elevated    Joint Mobilization  Grade II-III talocrual with dorsiflexion overpressure, grade II distal fibular AP      Ankle Exercises: Standing   BAPS  Standing;Level 3;10 reps    BAPS Limitations  DF/PF; Inv/Ev; Ambulance person  2 minutes    Rocker Board Limitations  lateral and DF/PF    Heel Raises  10 reps    Heel Raises Limitations  2sets    Toe Raise  10 reps    Toe Raise Limitations  2 sets    Other Standing Ankle Exercises  forward step up 1x10 ---> lateral step down with UE support for balance 2x 10      Ankle Exercises: Stretches   Slant Board Stretch  3 reps;30 seconds             PT Education - 09/14/19 1434    Education Details  Patient educated on compliance with HEP, proper levels of fatigue/challenging herself with exercises.    Person(s) Educated  Patient  Methods  Explanation;Demonstration;Handout    Comprehension  Verbalized understanding       PT Short Term Goals - 09/08/19 1718      PT SHORT TERM GOAL #1   Title  PT to be I in HEP to improve ankle strength by 1/2 grade to allow pt to be able to walk with her camboot for 40 minutes without increased pain.    Baseline  09/08/19:  Reports compliance wiht HEP everyother day, reports ability to walk for 15 minutes without increased pain    Status  On-going      PT SHORT TERM GOAL #2   Title  PT to be walking in her home without the camboot or crutches.    Baseline  09/08/19:  Currently wears camboot for work only, no AD    Status  Achieved        PT Long Term Goals - 08/22/19 1223      PT LONG TERM GOAL #1   Title  Pt pain in right ankle to be no greater than a 3/10 to allow her to walk outside without an assistive device or her camboot on.    Time  6    Period  Weeks    Status  New    Target Date  10/03/19      PT LONG TERM GOAL #2   Title  PT ankle strength to be at least 4+/5 to allow pt to go up and down 4 steps in a reciprocal manner with 1 hand rail.    Time  6     Period  Weeks    Status  New      PT LONG TERM GOAL #3   Title  PT to be able to tolerate walking /stand for an hour at a time inside to allow pt to complete shopping in comfort and return to work.    Time  6    Period  Weeks    Status  New      PT LONG TERM GOAL #4   Title  PT to be able to single leg stance for at least 30 seconds bilaterally to reduce risk of reinjuring    Time  6    Period  Weeks    Status  New            Plan - 09/14/19 1436    Clinical Impression Statement  Patient demonstrates minimal ROM improvement following manual therapy but states it feels less stiff. She shows fatigue with all standing ankle exercises today. She is educated on appropriate levels of exercise/ challenging herself while completing exercises. She performs forward step ups with ease but has challenge with lateral step downs. She requires moderate verbal cueing throughout session to limit UE use during standing exercises. Patient will continue to benefit from skilled physical therapy in order to reduce impairment and improve function.    Examination-Activity Limitations  Caring for Others;Carry;Locomotion Level;Squat;Stairs;Stand    Examination-Participation Restrictions  Cleaning;Community Activity;Driving;Laundry;Shop;Meal Prep    Stability/Clinical Decision Making  Stable/Uncomplicated    Rehab Potential  Good    PT Frequency  2x / week    PT Duration  6 weeks    PT Treatment/Interventions  Manual techniques;Manual lymph drainage;Compression bandaging;Therapeutic activities;Therapeutic exercise;Functional mobility training;Stair training;Gait training    PT Next Visit Plan  Progress standing exercises, add step ups and continue balance activities.    PT Home Exercise Plan  Isometrics; gastroc stretch against wall.09/01/19  Heel/toe raises.; 12/17: theraband all directions  09/14/19 lateral step down 2x10       Patient will benefit from skilled therapeutic intervention in order to  improve the following deficits and impairments:  Abnormal gait, Decreased activity tolerance, Decreased balance, Decreased range of motion, Decreased strength, Difficulty walking, Decreased skin integrity, Pain  Visit Diagnosis: Stiffness of right ankle, not elsewhere classified  Pain in right ankle and joints of right foot  Other abnormalities of gait and mobility     Problem List Patient Active Problem List   Diagnosis Date Noted  . Closed fracture of right ankle 07/21/2019  . Trapezius muscle strain, right, initial encounter 05/26/2019  . Cervical paraspinal muscle spasm 05/20/2017  . Allergic rhinitis 02/02/2017  . Depression with anxiety 11/06/2016  . Snoring 11/05/2016  . Essential hypertension, benign 12/22/2011  . Routine general medical examination at a health care facility 12/22/2011  . Hypertension 05/01/2011    2:47 PM, 09/14/19 Wyman Songster PT, DPT Physical Therapist at [facility]   [place] [facility] [address] [place], [place], [address] Phone: [phone]   Fax:  [phone]  Name: CHERL GORNEY MRN: [id number] Date of Birth: 04-Jul-1976

## 2019-09-15 ENCOUNTER — Encounter (HOSPITAL_COMMUNITY): Payer: No Typology Code available for payment source | Admitting: Physical Therapy

## 2019-09-19 ENCOUNTER — Encounter: Payer: Self-pay | Admitting: Family

## 2019-09-19 ENCOUNTER — Other Ambulatory Visit: Payer: Self-pay | Admitting: Internal Medicine

## 2019-09-19 DIAGNOSIS — M722 Plantar fascial fibromatosis: Secondary | ICD-10-CM

## 2019-09-19 MED ORDER — MELOXICAM 15 MG PO TABS: 15.0000 mg | ORAL_TABLET | Freq: Every day | ORAL | 0 refills | Status: DC

## 2019-09-20 ENCOUNTER — Encounter (HOSPITAL_COMMUNITY): Payer: Self-pay

## 2019-09-20 ENCOUNTER — Ambulatory Visit (HOSPITAL_COMMUNITY): Payer: No Typology Code available for payment source

## 2019-09-20 ENCOUNTER — Other Ambulatory Visit: Payer: Self-pay

## 2019-09-20 DIAGNOSIS — R2689 Other abnormalities of gait and mobility: Secondary | ICD-10-CM

## 2019-09-20 DIAGNOSIS — M25571 Pain in right ankle and joints of right foot: Secondary | ICD-10-CM | POA: Diagnosis not present

## 2019-09-20 DIAGNOSIS — M25671 Stiffness of right ankle, not elsewhere classified: Secondary | ICD-10-CM

## 2019-09-20 NOTE — Therapy (Signed)
[facility] [address] [place], [place], [address] Phone: [phone]   Fax:  [phone]  Physical Therapy Treatment  Patient Details  Name: Jenny Parsons MRN: [id number] Date of Birth: July 31, 1976 Referring Provider (PT): Fuller CanadaStanley Harrison    Encounter Date: 09/20/2019  PT End of Session - 09/20/19 1742    Visit Number  6    Number of Visits  12    Date for PT Re-Evaluation  10/03/19    Authorization Type  [place]    Authorization Time Period  11/30-->10/03/19    Authorization - Visit Number  6    Authorization - Number of Visits  12    PT Start Time  1735    PT Stop Time  1815    PT Time Calculation (min)  40 min    Activity Tolerance  Patient tolerated treatment well    Behavior During Therapy  [facility] for tasks assessed/performed       Past Medical History:  Diagnosis Date  . Hypertension     Past Surgical History:  Procedure Laterality Date  . TUBAL LIGATION      There were no vitals filed for this visit.  Subjective Assessment - 09/20/19 1732    Subjective  Pt stated she had a good Christmas, now enganged with smile on face.  Pt stated edema continues, does wear compression hose and CAM boot while working.  Arrived to therapy in tennis shoes., unable to tie shoes due to edema.  No reports of pain.    Pertinent History  Lt heel spur which is acting up now that she is putting the majority of her weight on her LT LE.    Patient Stated Goals  Off crutch and out of the boot and get back to work.    Currently in Pain?  No/denies         [facility] - 09/20/19 0001      Assessment   Medical Diagnosis  Rt ankle fx    Referring Provider (PT)  Fuller CanadaStanley Harrison     Onset Date/Surgical Date  07/07/19    Next MD Visit  09/26/19                   [facility] Adult PT Treatment/Exercise - 09/20/19 0001      Exercises   Exercises  Ankle      Manual Therapy   Manual Therapy  Edema management     Manual therapy comments  Manual complete separate than rest of tx    Edema Management  Retrograde massage with LE elevated      Ankle Exercises: Stretches   Slant Board Stretch  3 reps;30 seconds    Other Stretch  knee drive on 69GE12in step for dorsiflexion 5" 10"      Ankle Exercises: Standing   BAPS  Standing;Level 3;10 reps    BAPS Limitations  DF/PF; Inv/Ev; CW/CCW    Vector Stance  3 reps;5 seconds;Right    SLS  Rt 50", Lt 45"    Heel Raises  15 reps    Heel Raises Limitations  slope    Toe Raise  15 reps    Toe Raise Limitations  slope    Other Standing Ankle Exercises  forward and lateral step up 15x each Rt leading    Other Standing Ankle Exercises  Tandem stance 2x 30"               PT Short Term  Goals - 09/08/19 1718      PT SHORT TERM GOAL #1   Title  PT to be I in HEP to improve ankle strength by 1/2 grade to allow pt to be able to walk with her camboot for 40 minutes without increased pain.    Baseline  09/08/19:  Reports compliance wiht HEP everyother day, reports ability to walk for 15 minutes without increased pain    Status  On-going      PT SHORT TERM GOAL #2   Title  PT to be walking in her home without the camboot or crutches.    Baseline  09/08/19:  Currently wears camboot for work only, no AD    Status  Achieved        PT Long Term Goals - 08/22/19 1223      PT LONG TERM GOAL #1   Title  Pt pain in right ankle to be no greater than a 3/10 to allow her to walk outside without an assistive device or her camboot on.    Time  6    Period  Weeks    Status  New    [employer] Date  10/03/19      PT LONG TERM GOAL #2   Title  PT ankle strength to be at least 4+/5 to allow pt to go up and down 4 steps in a reciprocal manner with 1 hand rail.    Time  6    Period  Weeks    Status  New      PT LONG TERM GOAL #3   Title  PT to be able to tolerate walking /stand for an hour at a time inside to allow pt to complete shopping in comfort and return to work.     Time  6    Period  Weeks    Status  New      PT LONG TERM GOAL #4   Title  PT to be able to single leg stance for at least 30 seconds bilaterally to reduce risk of reinjuring    Time  6    Period  Weeks    Status  New            Plan - 09/20/19 1745    Clinical Impression Statement  Pt progressing well with functional strengthening and added balance activities wiht minimal difficulty, occassional HHA required with activiites on dynamic surface.  Pt continues to be limited by edema that does limit ROM especially dorsiflexion.  Manual retrograde massage complete to address edema, pt able to slide her foot into tennis shoes very easy following.  Did add knee drive to improve dorsiflexion.  Encouraged pt to begin icing ankle for edema control, verbalized understanding.    Examination-Activity Limitations  Caring for Others;Carry;Locomotion Level;Squat;Stairs;Stand    Examination-Participation Restrictions  Cleaning;Community Activity;Driving;Laundry;Shop;Meal Prep    Stability/Clinical Decision Making  Stable/Uncomplicated    Clinical Decision Making  Low    Rehab Potential  Good    PT Frequency  2x / week    PT Duration  6 weeks    PT Treatment/Interventions  Manual techniques;Manual lymph drainage;Compression bandaging;Therapeutic activities;Therapeutic exercise;Functional mobility training;Stair training;Gait training    PT Next Visit Plan  Review goals prior MD apt 09/26/19.  Continue progressing standing exercises and balance activities.  Begin step down training.    PT Home Exercise Plan  Isometrics; gastroc stretch against wall.09/01/19  Heel/toe raises.; 12/17: theraband all directions 09/14/19 lateral step down 2x10  Patient will benefit from skilled therapeutic intervention in order to improve the following deficits and impairments:  Abnormal gait, Decreased activity tolerance, Decreased balance, Decreased range of motion, Decreased strength, Difficulty walking, Decreased  skin integrity, Pain  Visit Diagnosis: Pain in right ankle and joints of right foot  Other abnormalities of gait and mobility  Stiffness of right ankle, not elsewhere classified     Problem List Patient Active Problem List   Diagnosis Date Noted  . Closed fracture of right ankle 07/21/2019  . Trapezius muscle strain, right, initial encounter 05/26/2019  . Cervical paraspinal muscle spasm 05/20/2017  . Allergic rhinitis 02/02/2017  . Depression with anxiety 11/06/2016  . Snoring 11/05/2016  . Essential hypertension, benign 12/22/2011  . Routine general medical examination at a health care facility 12/22/2011  . Hypertension 05/01/2011   Ihor Austin, [place]; [place]  Aldona Lento 09/20/2019, 6:26 PM  [facility] [address] [place], [place], [address] Phone: [phone]   Fax:  [phone]  Name: Jenny Parsons MRN: [id number] Date of Birth: 1976/05/01

## 2019-09-22 ENCOUNTER — Ambulatory Visit (HOSPITAL_COMMUNITY): Payer: No Typology Code available for payment source | Admitting: Physical Therapy

## 2019-09-22 ENCOUNTER — Telehealth (HOSPITAL_COMMUNITY): Payer: Self-pay | Admitting: Physical Therapy

## 2019-09-22 NOTE — Telephone Encounter (Signed)
pt woke up not feeling well so she cancelled for today

## 2019-09-26 ENCOUNTER — Other Ambulatory Visit: Payer: Self-pay

## 2019-09-26 ENCOUNTER — Ambulatory Visit (INDEPENDENT_AMBULATORY_CARE_PROVIDER_SITE_OTHER): Payer: No Typology Code available for payment source | Admitting: Orthopedic Surgery

## 2019-09-26 ENCOUNTER — Encounter: Payer: Self-pay | Admitting: Orthopedic Surgery

## 2019-09-26 DIAGNOSIS — S82891D Other fracture of right lower leg, subsequent encounter for closed fracture with routine healing: Secondary | ICD-10-CM

## 2019-09-26 NOTE — Patient Instructions (Signed)
Valet 4 weeks   Boot off
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
## 2019-09-27 ENCOUNTER — Ambulatory Visit (HOSPITAL_COMMUNITY): Payer: No Typology Code available for payment source

## 2019-09-29 ENCOUNTER — Ambulatory Visit (HOSPITAL_COMMUNITY): Payer: No Typology Code available for payment source | Admitting: Physical Therapy

## 2019-09-29 ENCOUNTER — Telehealth (HOSPITAL_COMMUNITY): Payer: Self-pay | Admitting: Physical Therapy

## 2019-09-29 NOTE — Telephone Encounter (Signed)
pt cancelled appt for today no reason given 

## 2019-10-11 ENCOUNTER — Ambulatory Visit (HOSPITAL_COMMUNITY): Payer: No Typology Code available for payment source

## 2019-10-11 ENCOUNTER — Telehealth (HOSPITAL_COMMUNITY): Payer: Self-pay

## 2019-10-11 NOTE — Telephone Encounter (Signed)
pt was getting COVID shot and the line was too long so she has to cancel

## 2019-10-13 ENCOUNTER — Ambulatory Visit (HOSPITAL_COMMUNITY): Payer: No Typology Code available for payment source | Attending: Orthopedic Surgery

## 2019-10-13 ENCOUNTER — Encounter (HOSPITAL_COMMUNITY): Payer: Self-pay

## 2019-10-13 ENCOUNTER — Other Ambulatory Visit: Payer: Self-pay

## 2019-10-13 DIAGNOSIS — M25571 Pain in right ankle and joints of right foot: Secondary | ICD-10-CM | POA: Diagnosis present

## 2019-10-13 DIAGNOSIS — R2689 Other abnormalities of gait and mobility: Secondary | ICD-10-CM | POA: Insufficient documentation

## 2019-10-13 DIAGNOSIS — M25671 Stiffness of right ankle, not elsewhere classified: Secondary | ICD-10-CM | POA: Insufficient documentation

## 2019-10-13 NOTE — Addendum Note (Signed)
Addended by: Wyman Songster on: 10/13/2019 04:35 PM   Modules accepted: Orders

## 2019-10-13 NOTE — Therapy (Addendum)
[facility] [address] [place], [place], [address] Phone: [phone]   Fax:  [phone]  Physical Therapy Treatment/ Discharge Summary/Recert   Patient Details  Name: Jenny Parsons MRN: [id number] Date of Birth: 1976-06-09 Referring Provider (PT): Arther Abbott   PHYSICAL THERAPY DISCHARGE SUMMARY  Visits from Start of Care: 7  Current functional level related to goals / functional outcomes: See below   Remaining deficits: See  below   Education / Equipment: See below  Plan: Patient agrees to discharge.  Patient goals were met. Patient is being discharged due to financial reasons.  ?????       Encounter Date: 10/13/2019  PT End of Session - 10/13/19 1538    Visit Number  7    Number of Visits  12    Date for PT Re-Evaluation  10/03/19    Authorization Type  [place]    Authorization Time Period  11/30-->10/03/19    Authorization - Visit Number  7    Authorization - Number of Visits  12    PT Start Time  2409    PT Stop Time  1600    PT Time Calculation (min)  27 min    Activity Tolerance  Patient tolerated treatment well    Behavior During Therapy  WFL for tasks assessed/performed       Past Medical History:  Diagnosis Date  . Hypertension     Past Surgical History:  Procedure Laterality Date  . TUBAL LIGATION      There were no vitals filed for this visit.  Subjective Assessment - 10/13/19 1533    Subjective  Pt reports ankle is feeling good, continues to have swelling.  Does wear compression hose that helps when wears it.  Arrived in tennis shoes, no longer wearing CAM boots.    Pertinent History  Lt heel spur which is acting up now that she is putting the majority of her weight on her LT LE.    Limitations  Standing;Walking;House hold activities    How long can you sit comfortably?  no problem    How long can you stand comfortably?  Able to stand for an hour at least at work (was 5-10 minute  stends to shift her wt.)    How long can you walk comfortably?  In tennis shoes able to walk for 45 minutes to an hour comfortably ( was with camboot and crutch for 15 minutes.)    Patient Stated Goals  Off crutch and out of the boot and get back to work.    Currently in Pain?  No/denies         [facility] PT Assessment - 10/13/19 0001      Assessment   Medical Diagnosis  Rt ankle fx    Referring Provider (PT)  Arther Abbott     Onset Date/Surgical Date  07/07/19    Next MD Visit  10/24/19      Precautions   Precautions  None      Restrictions   Weight Bearing Restrictions  Yes    RUE Weight Bearing  Weight bearing as tolerated      Observation/Other Assessments   Observations  Pt does continue to have swelling that increases with WB.  Educated and purchased compression hose that do assist with swelling when worn   significant swelling in forefoot as well as ankle, slight in   Focus on Therapeutic Outcomes (FOTO)   73   was 58  AROM   Right Ankle Dorsiflexion  14   was 10   Right Ankle Plantar Flexion  50   was 48   Right Ankle Inversion  35    Right Ankle Eversion  15   was 12     Strength   Right/Left Ankle  Right;Left    Right Ankle Dorsiflexion  5/5   was 4-/5   Right Ankle Plantar Flexion  4+/5   was 3/5   Right Ankle Inversion  5/5   was 3+/5   Right Ankle Eversion  4+/5   was 2/5     Ambulation/Gait   Ambulation/Gait  Yes    Ambulation/Gait Assistance  7: Independent    Ambulation Distance (Feet)  462 Feet    Assistive device  None    Gait Pattern  Within Functional Limits    Stairs  Yes    Stairs Assistance  7: Independent    Stair Management Technique  Alternating pattern;No rails    Number of Stairs  4   3 RT, equal 12 steps   Height of Stairs  7    Gait Comments  2MWT no AD, able to complete reciprocal pattern no handrails needed                   [facility] Adult PT Treatment/Exercise - 10/13/19 0001      Exercises   Exercises   Ankle      Ankle Exercises: Standing   SLS  10/13/19: Rt 40", Lt 30"    Other Standing Ankle Exercises  7in step height reciprocal pattern no HR; 4 sets             PT Education - 10/13/19 1618    Education Details  Reviewed continued HEP.  Benefits of compression hose/socks for edema control    Methods  Explanation    Comprehension  Verbalized understanding       PT Short Term Goals - 10/13/19 1542      PT SHORT TERM GOAL #1   Title  PT to be I in HEP to improve ankle strength by 1/2 grade to allow pt to be able to walk with her camboot for 40 minutes without increased pain.    Baseline  01/21:  Complaint with HEP couple times a week.  09/08/19:  Reports compliance wiht HEP everyother day, reports ability to walk for 15 minutes without increased pain    Status  Achieved      PT SHORT TERM GOAL #2   Title  PT to be walking in her home without the camboot or crutches.    Baseline  09/08/19:  Currently wears camboot for work only, no AD    Status  Achieved      PT SHORT TERM GOAL #3   Title  PT to be able to stand for 30 minutes without increased pain to be able to make a quick meal.    Baseline  Able to stand for an hour at least at work (was 5-10 minute stends to shift her wt.)    Status  Achieved        PT Long Term Goals - 10/13/19 1544      PT LONG TERM GOAL #1   Title  Pt pain in right ankle to be no greater than a 3/10 to allow her to walk outside without an assistive device or her camboot on.    Baseline  10/13/19: Reports pain rarely, does increase with standing for long periods  of time.    Status  Achieved      PT LONG TERM GOAL #2   Title  PT ankle strength to be at least 4+/5 to allow pt to go up and down 4 steps in a reciprocal manner with 1 hand rail.    Baseline  10/13/19: see MMT    Status  Achieved      PT LONG TERM GOAL #3   Title  PT to be able to tolerate walking /stand for an hour at a time inside to allow pt to complete shopping in comfort and  return to work.    Baseline  10/13/19: In tennis shoes able to walk for 45 minutes to an hour comfortably ( was with camboot and crutch for 15 minutes.)    Status  Achieved      PT LONG TERM GOAL #4   Title  PT to be able to single leg stance for at least 30 seconds bilaterally to reduce risk of reinjuring    Baseline  10/13/19: Rt 40", Lt 30"    Status  Achieved            Plan - 10/13/19 1609    Clinical Impression Statement  Pt has been out of therapy since 12/29.  Arrived to session today wearing tennis shoes and reports great progress.  Reviewed goals today with the following findings: Pt reports compliance wiht HEP a couple times a week, reports ability to complete whole day sessions at work with no difficulty and able to stand/ambulate for long periods of time.  Improved strength and ROM and minimal pain.  Pt does continue to have significant swelling in ankle, pt has been educated benefits and purchased compression hose that do help when wearing.  Reviewed exercises/stretches to continue at home as all STG and LTGs met.  DC to HEP.    Examination-Activity Limitations  Caring for Others;Carry;Locomotion Level;Squat;Stairs;Stand    Examination-Participation Restrictions  Cleaning;Community Activity;Driving;Laundry;Shop;Meal Prep    Stability/Clinical Decision Making  Stable/Uncomplicated    Clinical Decision Making  Low    Rehab Potential  Good    PT Frequency  --    PT Duration  --    PT Treatment/Interventions  Manual techniques;Manual lymph drainage;Compression bandaging;Therapeutic activities;Therapeutic exercise;Functional mobility training;Stair training;Gait training    PT Next Visit Plan  DC to HEP.    PT Home Exercise Plan  Isometrics; gastroc stretch against wall.09/01/19  Heel/toe raises.; 12/17: theraband all directions 09/14/19 lateral step down 2x10       Patient has met 3/3 short term goals and 4/4 long term goals at this time. She has met all goals with decreased  pain and improved gait, strength, and balance as well as overall functional mobility. She wishes to be discharged at this time. I agree with all of the above statements.  4:34 PM, 10/13/19 Mearl Latin PT, DPT Physical Therapist at [facility]   Patient will benefit from skilled therapeutic intervention in order to improve the following deficits and impairments:  Abnormal gait, Decreased activity tolerance, Decreased balance, Decreased range of motion, Decreased strength, Difficulty walking, Decreased skin integrity, Pain  Visit Diagnosis: Other abnormalities of gait and mobility  Stiffness of right ankle, not elsewhere classified  Pain in right ankle and joints of right foot     Problem List Patient Active Problem List   Diagnosis Date Noted  . Closed fracture of right ankle 07/21/2019  . Trapezius muscle strain, right, initial encounter 05/26/2019  .  Cervical paraspinal muscle spasm 05/20/2017  . Allergic rhinitis 02/02/2017  . Depression with anxiety 11/06/2016  . Snoring 11/05/2016  . Essential hypertension, benign 12/22/2011  . Routine general medical examination at a health care facility 12/22/2011  . Hypertension 05/01/2011   Ihor Austin, [place]; [facility] [phone]  Vianne Bulls Zaunegger 10/13/2019, 4:34 PM  [facility] [address] [place], [place], [address] Phone: [phone]   Fax:  [phone]  Name: Jenny Parsons MRN: [id number] Date of Birth: [birth year]

## 2019-10-24 ENCOUNTER — Encounter: Payer: Self-pay | Admitting: Orthopedic Surgery

## 2019-10-24 ENCOUNTER — Ambulatory Visit (INDEPENDENT_AMBULATORY_CARE_PROVIDER_SITE_OTHER): Payer: No Typology Code available for payment source | Admitting: Orthopedic Surgery

## 2019-10-24 ENCOUNTER — Other Ambulatory Visit: Payer: Self-pay

## 2019-10-24 ENCOUNTER — Telehealth: Payer: Self-pay | Admitting: Family

## 2019-10-24 VITALS — BP 165/94 | HR 84 | Ht 61.0 in | Wt 224.0 lb

## 2019-10-24 DIAGNOSIS — S82891D Other fracture of right lower leg, subsequent encounter for closed fracture with routine healing: Secondary | ICD-10-CM

## 2019-10-24 NOTE — Telephone Encounter (Signed)
My-chart message read and received.

## 2019-10-24 NOTE — Telephone Encounter (Signed)
I am concerned at how her blood pressure looked at her recent ortho follow-up. I want her to start checking it daily please and let me hear from her in the next 1-2 weeks. We may need to re-start another medication.

## 2019-10-24 NOTE — Telephone Encounter (Signed)
My chart message sent to patient today.
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
## 2019-11-14 ENCOUNTER — Other Ambulatory Visit: Payer: Self-pay

## 2019-11-14 ENCOUNTER — Ambulatory Visit (INDEPENDENT_AMBULATORY_CARE_PROVIDER_SITE_OTHER): Payer: No Typology Code available for payment source | Admitting: Family

## 2019-11-14 ENCOUNTER — Encounter: Payer: Self-pay | Admitting: Family

## 2019-11-14 VITALS — BP 132/80 | HR 69 | Temp 98.0°F | Ht 61.0 in | Wt 226.0 lb

## 2019-11-14 DIAGNOSIS — Z13220 Encounter for screening for lipoid disorders: Secondary | ICD-10-CM

## 2019-11-14 DIAGNOSIS — M79622 Pain in left upper arm: Secondary | ICD-10-CM

## 2019-11-14 DIAGNOSIS — R5383 Other fatigue: Secondary | ICD-10-CM | POA: Diagnosis not present

## 2019-11-14 DIAGNOSIS — R7309 Other abnormal glucose: Secondary | ICD-10-CM

## 2019-11-14 MED ORDER — CEPHALEXIN 500 MG PO CAPS: 500.0000 mg | ORAL_CAPSULE | Freq: Three times a day (TID) | ORAL | 0 refills | Status: DC

## 2019-11-14 NOTE — Progress Notes (Signed)
Jenny Parsons is a 44 y.o. female with the following history as recorded in EpicCare:  Patient Active Problem List   Diagnosis Date Noted  . Closed fracture of right ankle 07/21/2019  . Trapezius muscle strain, right, initial encounter 05/26/2019  . Cervical paraspinal muscle spasm 05/20/2017  . Allergic rhinitis 02/02/2017  . Depression with anxiety 11/06/2016  . Snoring 11/05/2016  . Essential hypertension, benign 12/22/2011  . Routine general medical examination at a health care facility 12/22/2011  . Hypertension 05/01/2011    Current Outpatient Medications  Medication Sig Dispense Refill  . hydrochlorothiazide (HYDRODIURIL) 25 MG tablet Take 1 tablet (25 mg total) by mouth daily. 90 tablet 3  . meloxicam (MOBIC) 15 MG tablet Take 1 tablet (15 mg total) by mouth daily. 90 tablet 0  . cephALEXin (KEFLEX) 500 MG capsule Take 1 capsule (500 mg total) by mouth 3 (three) times daily. 15 capsule 0   No current facility-administered medications for this visit.    Allergies: Ramipril  Past Medical History:  Diagnosis Date  . Hypertension     Past Surgical History:  Procedure Laterality Date  . TUBAL LIGATION      Family History  Problem Relation Age of Onset  . Hypertension Mother   . Breast cancer Paternal Grandmother   . Cancer Neg Hx   . Diabetes Neg Hx   . Stroke Neg Hx     Social History   Tobacco Use  . Smoking status: Never Smoker  . Smokeless tobacco: Never Used  Substance Use Topics  . Alcohol use: No    Subjective:  Patient is having left axillary pain x 1- 1 1/2 week; notes that area of concern is "tender to touch." History of fibrocystic changes in left breast; has had U/S in 2014 and 2019 due to similar symptoms; did take her 2nd COVID vaccine 2 weeks ago; Had normal mammogram in October 2020;    Doing well on HCTZ 25 mg daily- does check her pressure occasionally and readings are consistent to what is seen today; Denies any chest pain, shortness of  breath, blurred vision or headache.   Objective:  Vitals:   11/14/19 1418  BP: 132/80  Pulse: 69  Temp: 98 F (36.7 C)  TempSrc: Oral  SpO2: 99%  Weight: 226 lb (102.5 kg)  Height: '5\' 1"'$  (1.549 m)    General: Well developed, well nourished, in no acute distress  Skin : Warm and dry.  Head: Normocephalic and atraumatic  Eyes: Sclera and conjunctiva clear; pupils round and reactive to light; extraocular movements intact  Ears: External normal; canals clear; tympanic membranes normal  Lungs: Respirations unlabored;  Neurologic: Alert and oriented; speech intact; face symmetrical; moves all extremities well; CNII-XII intact without focal deficit  Breast exam normal; no abnormality noted in axillary region on exam  Assessment:  1. Left axillary pain   2. Other fatigue   3. Lipid screening   4. Elevated glucose     Plan:   Normal mammogram in October 2020; will update ultrasound- ? Localized lymph node reaction from recent COVID vaccine or fibrocystic changes; will treat with Keflex 500 mg tid x 5 days; follow-up to be determined; She will go to [place] location at her convenience in the next few weeks to update yearly labs- lab order in place.   This visit occurred during the SARS-CoV-2 public health emergency.  Safety protocols were in place, including screening questions prior to the visit, additional usage of staff PPE, and extensive  cleaning of exam room while observing appropriate contact time as indicated for disinfecting solutions.     No follow-ups on file.  Orders Placed This Encounter  Procedures  . US BREAST COMPLETE UNI LEFT INC AXILLA    Standing Status:   Future    Standing Expiration Date:   01/11/2021    Order Specific Question:   Reason for Exam (SYMPTOM  OR DIAGNOSIS REQUIRED)    Answer:   left axillary pain; took COVID vaccine 2 weeks ago    Order Specific Question:   Preferred imaging location?    Answer:   [facility]  . CBC w/Diff    Standing Status:    Future    Standing Expiration Date:   11/13/2020  . Comp Met (CMET)    Standing Status:   Future    Standing Expiration Date:   11/13/2020  . Lipid panel    Standing Status:   Future    Standing Expiration Date:   11/13/2020  . TSH    Standing Status:   Future    Standing Expiration Date:   11/13/2020  . B12    Standing Status:   Future    Standing Expiration Date:   11/13/2020  . HgB A1c    Standing Status:   Future    Standing Expiration Date:   11/13/2020    Requested Prescriptions   Signed Prescriptions Disp Refills  . cephALEXin (KEFLEX) 500 MG capsule 15 capsule 0    Sig: Take 1 capsule (500 mg total) by mouth 3 (three) times daily.

## 2019-11-16 ENCOUNTER — Other Ambulatory Visit: Payer: Self-pay | Admitting: Family

## 2019-11-16 DIAGNOSIS — M79622 Pain in left upper arm: Secondary | ICD-10-CM

## 2019-11-22 ENCOUNTER — Telehealth: Payer: Self-pay

## 2019-11-22 NOTE — Telephone Encounter (Signed)
Irving Burton with [facility] calling and states that she is needing medical records and billing from 07/07/2019-Present. States that she spoke with someone and told them she did not need them at that time, but now she does need them.  Release of Information scanned into Media tab from 11/03/2019 is the request. Please advise.  Fax#: [phone] Sherrell Puller

## 2019-11-25 ENCOUNTER — Other Ambulatory Visit (INDEPENDENT_AMBULATORY_CARE_PROVIDER_SITE_OTHER): Payer: No Typology Code available for payment source

## 2019-11-25 ENCOUNTER — Encounter: Payer: Self-pay | Admitting: Family

## 2019-11-25 DIAGNOSIS — R5383 Other fatigue: Secondary | ICD-10-CM | POA: Diagnosis not present

## 2019-11-25 DIAGNOSIS — Z13220 Encounter for screening for lipoid disorders: Secondary | ICD-10-CM

## 2019-11-25 DIAGNOSIS — R7309 Other abnormal glucose: Secondary | ICD-10-CM

## 2019-11-25 LAB — COMPREHENSIVE METABOLIC PANEL
ALT: 11 U/L (ref 0–35)
AST: 14 U/L (ref 0–37)
Albumin: 4.1 g/dL (ref 3.5–5.2)
Alkaline Phosphatase: 89 U/L (ref 39–117)
BUN: 16 mg/dL (ref 6–23)
CO2: 25 mEq/L (ref 19–32)
Calcium: 9.5 mg/dL (ref 8.4–10.5)
Chloride: 102 mEq/L (ref 96–112)
Creatinine, Ser: 0.73 mg/dL (ref 0.40–1.20)
GFR: 87.61 mL/min (ref >60.00)
Glucose, Bld: 97 mg/dL (ref 70–99)
Potassium: 4 mEq/L (ref 3.5–5.1)
Sodium: 137 mEq/L (ref 135–145)
Total Bilirubin: 0.8 mg/dL (ref 0.2–1.2)
Total Protein: 7.3 g/dL (ref 6.0–8.3)

## 2019-11-25 LAB — CBC WITH DIFFERENTIAL/PLATELET
Basophils Absolute: 0 10*3/uL (ref 0.0–0.1)
Basophils Relative: 0.4 % (ref 0.0–3.0)
Eosinophils Absolute: 0.1 10*3/uL (ref 0.0–0.7)
Eosinophils Relative: 2.2 % (ref 0.0–5.0)
HCT: 39.6 % (ref 36.0–46.0)
Hemoglobin: 13.4 g/dL (ref 12.0–15.0)
Lymphocytes Relative: 24.6 % (ref 12.0–46.0)
Lymphs Abs: 1.6 10*3/uL (ref 0.7–4.0)
MCHC: 33.9 g/dL (ref 30.0–36.0)
MCV: 84 fl (ref 78.0–100.0)
Monocytes Absolute: 0.4 10*3/uL (ref 0.1–1.0)
Monocytes Relative: 5.7 % (ref 3.0–12.0)
Neutro Abs: 4.3 10*3/uL (ref 1.4–7.7)
Neutrophils Relative %: 67.1 % (ref 43.0–77.0)
Platelets: 220 10*3/uL (ref 150.0–400.0)
RBC: 4.71 Mil/uL (ref 3.87–5.11)
RDW: 13.4 % (ref 11.5–15.5)
WBC: 6.4 10*3/uL (ref 4.0–10.5)

## 2019-11-25 LAB — LIPID PANEL
Cholesterol: 209 mg/dL — ABNORMAL HIGH (ref 0–200)
HDL: 51.1 mg/dL (ref >39.00)
LDL Cholesterol: 137 mg/dL — ABNORMAL HIGH (ref 0–99)
NonHDL: 158.11
Total CHOL/HDL Ratio: 4
Triglycerides: 104 mg/dL (ref 0.0–149.0)
VLDL: 20.8 mg/dL (ref 0.0–40.0)

## 2019-11-25 LAB — VITAMIN B12: Vitamin B-12: 220 pg/mL (ref 211–911)

## 2019-11-25 LAB — TSH: TSH: 1.61 u[IU]/mL (ref 0.35–4.50)

## 2019-11-25 LAB — HEMOGLOBIN A1C: Hgb A1c MFr Bld: 5.1 % (ref 4.6–6.5)

## 2019-11-30 ENCOUNTER — Ambulatory Visit (INDEPENDENT_AMBULATORY_CARE_PROVIDER_SITE_OTHER): Payer: No Typology Code available for payment source | Admitting: *Deleted

## 2019-11-30 ENCOUNTER — Other Ambulatory Visit: Payer: Self-pay

## 2019-11-30 DIAGNOSIS — E538 Deficiency of other specified B group vitamins: Secondary | ICD-10-CM | POA: Diagnosis not present

## 2019-11-30 MED ORDER — CYANOCOBALAMIN 1000 MCG/ML IJ SOLN
1000.0000 ug | Freq: Once | INTRAMUSCULAR | Status: AC
  Administered 2019-11-30: 1000 ug via INTRAMUSCULAR

## 2019-11-30 NOTE — Progress Notes (Signed)
b12 Injection given.   Latrelle Fuston J Matie Dimaano, MD  

## 2019-11-30 NOTE — Progress Notes (Signed)
Pls cosign for B12 inj since PCP is out of the office this afternoon.Marland KitchenRaechel Chute

## 2019-12-02 ENCOUNTER — Other Ambulatory Visit: Payer: No Typology Code available for payment source

## 2019-12-06 ENCOUNTER — Encounter: Payer: Self-pay | Admitting: Podiatry

## 2019-12-06 ENCOUNTER — Ambulatory Visit: Payer: No Typology Code available for payment source | Admitting: Podiatry

## 2019-12-06 ENCOUNTER — Other Ambulatory Visit: Payer: Self-pay

## 2019-12-06 ENCOUNTER — Ambulatory Visit (INDEPENDENT_AMBULATORY_CARE_PROVIDER_SITE_OTHER): Payer: No Typology Code available for payment source

## 2019-12-06 ENCOUNTER — Ambulatory Visit
Admission: RE | Admit: 2019-12-06 | Discharge: 2019-12-06 | Disposition: A | Discharge status: Home / Self Care | Payer: No Typology Code available for payment source | Source: Ambulatory Visit | Attending: Family | Admitting: Family

## 2019-12-06 ENCOUNTER — Other Ambulatory Visit: Payer: Self-pay | Admitting: Family

## 2019-12-06 VITALS — Temp 96.9°F | Resp 14

## 2019-12-06 DIAGNOSIS — M779 Enthesopathy, unspecified: Secondary | ICD-10-CM | POA: Diagnosis not present

## 2019-12-06 DIAGNOSIS — M79622 Pain in left upper arm: Secondary | ICD-10-CM

## 2019-12-06 DIAGNOSIS — M7732 Calcaneal spur, left foot: Secondary | ICD-10-CM

## 2019-12-06 MED ORDER — METHYLPREDNISOLONE 4 MG PO TBPK: ORAL_TABLET | ORAL | 0 refills | Status: DC

## 2019-12-06 NOTE — Patient Instructions (Signed)

## 2019-12-07 ENCOUNTER — Ambulatory Visit (INDEPENDENT_AMBULATORY_CARE_PROVIDER_SITE_OTHER): Payer: No Typology Code available for payment source

## 2019-12-07 DIAGNOSIS — E538 Deficiency of other specified B group vitamins: Secondary | ICD-10-CM | POA: Diagnosis not present

## 2019-12-07 MED ORDER — CYANOCOBALAMIN 1000 MCG/ML IJ SOLN
1000.0000 ug | Freq: Once | INTRAMUSCULAR | Status: AC
  Administered 2019-12-07: 1000 ug via INTRAMUSCULAR

## 2019-12-11 DIAGNOSIS — M7732 Calcaneal spur, left foot: Secondary | ICD-10-CM | POA: Insufficient documentation
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
## 2019-12-13 NOTE — Progress Notes (Signed)
Agree with treatment 

## 2019-12-15 ENCOUNTER — Other Ambulatory Visit: Payer: Self-pay

## 2019-12-15 ENCOUNTER — Ambulatory Visit (INDEPENDENT_AMBULATORY_CARE_PROVIDER_SITE_OTHER): Payer: No Typology Code available for payment source | Admitting: *Deleted

## 2019-12-15 ENCOUNTER — Ambulatory Visit: Payer: No Typology Code available for payment source

## 2019-12-15 DIAGNOSIS — E538 Deficiency of other specified B group vitamins: Secondary | ICD-10-CM | POA: Diagnosis not present

## 2019-12-15 MED ORDER — CYANOCOBALAMIN 1000 MCG/ML IJ SOLN
1000.0000 ug | Freq: Once | INTRAMUSCULAR | Status: AC
  Administered 2019-12-15: 11:00:00 1000 ug via INTRAMUSCULAR

## 2019-12-15 NOTE — Progress Notes (Addendum)
Pls cosign for B12 inj since PCP is out of the office../lmb    b12 Injection given.   Stacy J Burns, MD  

## 2019-12-22 ENCOUNTER — Telehealth: Payer: Self-pay | Admitting: *Deleted

## 2019-12-22 ENCOUNTER — Other Ambulatory Visit: Payer: Self-pay

## 2019-12-22 ENCOUNTER — Ambulatory Visit (INDEPENDENT_AMBULATORY_CARE_PROVIDER_SITE_OTHER): Payer: No Typology Code available for payment source | Admitting: *Deleted

## 2019-12-22 DIAGNOSIS — E538 Deficiency of other specified B group vitamins: Secondary | ICD-10-CM

## 2019-12-22 MED ORDER — CYANOCOBALAMIN 1000 MCG/ML IJ SOLN
1000.0000 ug | Freq: Once | INTRAMUSCULAR | Status: AC
  Administered 2019-12-22: 1000 ug via INTRAMUSCULAR

## 2019-12-22 NOTE — Progress Notes (Signed)
Pls cosign for B12 inj since PCP is out of the office../lmb 

## 2019-12-22 NOTE — Telephone Encounter (Signed)
Pt came in for her 4th B12 inj. She is wanting to know if she need to have labs check, or continue taking injections monthly.Marland KitchenRaechel Chute

## 2019-12-26 ENCOUNTER — Encounter: Payer: Self-pay | Admitting: Podiatry

## 2019-12-26 ENCOUNTER — Encounter: Payer: Self-pay | Admitting: Family

## 2019-12-27 NOTE — Telephone Encounter (Signed)
Message sent to patient via mychart

## 2019-12-27 NOTE — Telephone Encounter (Signed)
Yes, she needs a lab check again in about 2 weeks; does she want to go to [facility] or come here? We will then decide what to do about follow-up.

## 2019-12-28 ENCOUNTER — Other Ambulatory Visit: Payer: Self-pay | Admitting: Family

## 2019-12-28 DIAGNOSIS — E538 Deficiency of other specified B group vitamins: Secondary | ICD-10-CM

## 2020-01-03 ENCOUNTER — Ambulatory Visit: Payer: No Typology Code available for payment source | Admitting: Podiatry

## 2020-01-05 ENCOUNTER — Other Ambulatory Visit (INDEPENDENT_AMBULATORY_CARE_PROVIDER_SITE_OTHER): Payer: No Typology Code available for payment source

## 2020-01-05 ENCOUNTER — Encounter: Payer: Self-pay | Admitting: Family

## 2020-01-05 DIAGNOSIS — E538 Deficiency of other specified B group vitamins: Secondary | ICD-10-CM | POA: Diagnosis not present

## 2020-01-05 LAB — VITAMIN B12: Vitamin B-12: 351 pg/mL (ref 211–911)

## 2020-01-23 ENCOUNTER — Encounter: Payer: Self-pay | Admitting: Family

## 2020-01-25 ENCOUNTER — Ambulatory Visit (INDEPENDENT_AMBULATORY_CARE_PROVIDER_SITE_OTHER): Payer: No Typology Code available for payment source | Admitting: *Deleted

## 2020-01-25 ENCOUNTER — Other Ambulatory Visit: Payer: Self-pay

## 2020-01-25 DIAGNOSIS — E538 Deficiency of other specified B group vitamins: Secondary | ICD-10-CM | POA: Diagnosis not present

## 2020-01-25 MED ORDER — CYANOCOBALAMIN 1000 MCG/ML IJ SOLN
1000.0000 ug | Freq: Once | INTRAMUSCULAR | Status: AC
  Administered 2020-01-25: 1000 ug via INTRAMUSCULAR

## 2020-01-25 NOTE — Progress Notes (Signed)
Pls cosign for B12 inj since Vernona Rieger is not in the office this afternoon.Marland KitchenRaechel Chute

## 2020-02-01 ENCOUNTER — Ambulatory Visit: Payer: No Typology Code available for payment source

## 2020-02-08 ENCOUNTER — Other Ambulatory Visit: Payer: Self-pay

## 2020-02-08 ENCOUNTER — Ambulatory Visit (INDEPENDENT_AMBULATORY_CARE_PROVIDER_SITE_OTHER): Payer: No Typology Code available for payment source | Admitting: *Deleted

## 2020-02-08 DIAGNOSIS — E538 Deficiency of other specified B group vitamins: Secondary | ICD-10-CM | POA: Diagnosis not present

## 2020-02-08 MED ORDER — CYANOCOBALAMIN 1000 MCG/ML IJ SOLN
1000.0000 ug | Freq: Once | INTRAMUSCULAR | Status: AC
  Administered 2020-02-08: 1000 ug via INTRAMUSCULAR

## 2020-02-08 NOTE — Progress Notes (Signed)
Pls cosign for B12 inj../lmb  

## 2020-02-13 ENCOUNTER — Ambulatory Visit (INDEPENDENT_AMBULATORY_CARE_PROVIDER_SITE_OTHER): Payer: No Typology Code available for payment source

## 2020-02-13 ENCOUNTER — Encounter: Payer: Self-pay | Admitting: Podiatry

## 2020-02-13 ENCOUNTER — Other Ambulatory Visit: Payer: Self-pay

## 2020-02-13 ENCOUNTER — Ambulatory Visit (INDEPENDENT_AMBULATORY_CARE_PROVIDER_SITE_OTHER): Payer: No Typology Code available for payment source | Admitting: Podiatry

## 2020-02-13 ENCOUNTER — Telehealth: Payer: Self-pay | Admitting: *Deleted

## 2020-02-13 DIAGNOSIS — M778 Other enthesopathies, not elsewhere classified: Secondary | ICD-10-CM

## 2020-02-13 DIAGNOSIS — T148XXA Other injury of unspecified body region, initial encounter: Secondary | ICD-10-CM

## 2020-02-13 DIAGNOSIS — M7732 Calcaneal spur, left foot: Secondary | ICD-10-CM

## 2020-02-13 DIAGNOSIS — M779 Enthesopathy, unspecified: Secondary | ICD-10-CM

## 2020-02-13 DIAGNOSIS — S8261XS Displaced fracture of lateral malleolus of right fibula, sequela: Secondary | ICD-10-CM

## 2020-02-13 DIAGNOSIS — M79671 Pain in right foot: Secondary | ICD-10-CM

## 2020-02-13 MED ORDER — IBUPROFEN 800 MG PO TABS: 800.0000 mg | ORAL_TABLET | Freq: Three times a day (TID) | ORAL | 0 refills | Status: DC | PRN

## 2020-02-13 NOTE — Patient Instructions (Signed)
I have ordered an MRI of the right ankle. If you do not hear for them about scheduling within the next 1 week, or you have any questions please give [place] a call at [phone].

## 2020-02-13 NOTE — Telephone Encounter (Signed)
-----   Message from Vivi Barrack, DPM sent at 02/13/2020  2:11 PM EDT ----- Actually can you order her MRI for [facility]? Thanks.

## 2020-02-13 NOTE — Telephone Encounter (Signed)
Faxed orders to [facility] main scheduling.
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
## 2020-02-14 ENCOUNTER — Other Ambulatory Visit: Payer: Self-pay | Admitting: Podiatry

## 2020-02-14 DIAGNOSIS — T148XXA Other injury of unspecified body region, initial encounter: Secondary | ICD-10-CM
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
## 2020-02-15 ENCOUNTER — Ambulatory Visit: Payer: No Typology Code available for payment source

## 2020-02-16 ENCOUNTER — Ambulatory Visit (INDEPENDENT_AMBULATORY_CARE_PROVIDER_SITE_OTHER): Payer: No Typology Code available for payment source | Admitting: *Deleted

## 2020-02-16 ENCOUNTER — Other Ambulatory Visit: Payer: Self-pay

## 2020-02-16 DIAGNOSIS — E538 Deficiency of other specified B group vitamins: Secondary | ICD-10-CM | POA: Diagnosis not present

## 2020-02-16 MED ORDER — CYANOCOBALAMIN 1000 MCG/ML IJ SOLN
1000.0000 ug | Freq: Once | INTRAMUSCULAR | Status: AC
  Administered 2020-02-16: 1000 ug via INTRAMUSCULAR

## 2020-02-16 NOTE — Progress Notes (Signed)
Medical treatment/procedure(s) were performed by non-physician practitioner and as supervising physician I was immediately available for consultation/collaboration. I agree with above. Averie Meiner A Brindle Leyba, MD  

## 2020-02-16 NOTE — Progress Notes (Signed)
Pls cosign for B12 inj../lmb  

## 2020-02-17 ENCOUNTER — Telehealth: Payer: Self-pay | Admitting: *Deleted

## 2020-02-17 NOTE — Telephone Encounter (Signed)
Called on 02-16-2020 and spoke with Iran Sizer from [facility] and then procedure code [id number] was approved and the authorization umber is [id number] and is valid from 02-16-2020 thru 03-17-2020.Misty Stanley

## 2020-02-21 HISTORY — PX: OTHER SURGICAL HISTORY: SHX169

## 2020-02-22 ENCOUNTER — Encounter: Payer: Self-pay | Admitting: Podiatry

## 2020-02-22 ENCOUNTER — Ambulatory Visit (INDEPENDENT_AMBULATORY_CARE_PROVIDER_SITE_OTHER): Payer: No Typology Code available for payment source | Admitting: *Deleted

## 2020-02-22 ENCOUNTER — Ambulatory Visit (HOSPITAL_COMMUNITY)
Admission: RE | Admit: 2020-02-22 | Discharge: 2020-02-22 | Disposition: A | Discharge status: Home / Self Care | Payer: No Typology Code available for payment source | Source: Ambulatory Visit | Attending: Podiatry | Admitting: Podiatry

## 2020-02-22 ENCOUNTER — Other Ambulatory Visit: Payer: Self-pay

## 2020-02-22 DIAGNOSIS — E538 Deficiency of other specified B group vitamins: Secondary | ICD-10-CM

## 2020-02-22 DIAGNOSIS — M7732 Calcaneal spur, left foot: Secondary | ICD-10-CM | POA: Insufficient documentation

## 2020-02-22 DIAGNOSIS — M779 Enthesopathy, unspecified: Secondary | ICD-10-CM | POA: Diagnosis present

## 2020-02-22 MED ORDER — CYANOCOBALAMIN 1000 MCG/ML IJ SOLN
1000.0000 ug | Freq: Once | INTRAMUSCULAR | Status: AC
Start: 2020-02-22 — End: 2020-02-22
  Administered 2020-02-22: 1000 ug via INTRAMUSCULAR

## 2020-02-22 NOTE — Progress Notes (Signed)
Pls cosign for B12 inj../lmb  

## 2020-02-23 ENCOUNTER — Other Ambulatory Visit: Payer: Self-pay

## 2020-02-23 ENCOUNTER — Ambulatory Visit (INDEPENDENT_AMBULATORY_CARE_PROVIDER_SITE_OTHER): Payer: No Typology Code available for payment source | Admitting: Podiatry

## 2020-02-23 ENCOUNTER — Encounter: Payer: Self-pay | Admitting: Podiatry

## 2020-02-23 VITALS — Temp 97.3°F

## 2020-02-23 DIAGNOSIS — R2241 Localized swelling, mass and lump, right lower limb: Secondary | ICD-10-CM | POA: Diagnosis not present

## 2020-02-23 DIAGNOSIS — T148XXA Other injury of unspecified body region, initial encounter: Secondary | ICD-10-CM

## 2020-02-23 DIAGNOSIS — S96911D Strain of unspecified muscle and tendon at ankle and foot level, right foot, subsequent encounter: Secondary | ICD-10-CM | POA: Diagnosis not present

## 2020-02-23 NOTE — Patient Instructions (Signed)
Pre-Operative Instructions  Congratulations, you have decided to take an important step towards improving your quality of life.  You can be assured that the doctors and staff at [facility] will be with you every step of the way.  Here are some important things you should know:  1. Plan to be at the surgery center/hospital at least 1 (one) hour prior to your scheduled time, unless otherwise directed by the surgical center/hospital staff.  You must have a responsible adult accompany you, remain during the surgery and drive you home.  Make sure you have directions to the surgical center/hospital to ensure you arrive on time. 2. If you are having surgery at [facility] or [facility] hospitals, you will need a copy of your medical history and physical form from your family physician within one month prior to the date of surgery. We will give you a form for your primary physician to complete.  3. We make every effort to accommodate the date you request for surgery.  However, there are times where surgery dates or times have to be moved.  We will contact you as soon as possible if a change in schedule is required.   4. No aspirin/ibuprofen for one week before surgery.  If you are on aspirin, any non-steroidal anti-inflammatory medications (Mobic, Aleve, Ibuprofen) should not be taken seven (7) days prior to your surgery.  You make take Tylenol for pain prior to surgery.  5. Medications - If you are taking daily heart and blood pressure medications, seizure, reflux, allergy, asthma, anxiety, pain or diabetes medications, make sure you notify the surgery center/hospital before the day of surgery so they can tell you which medications you should take or avoid the day of surgery. 6. No food or drink after midnight the night before surgery unless directed otherwise by surgical center/hospital staff. 7. No alcoholic beverages 24-hours prior to surgery.  No smoking 24-hours prior or 24-hours after  surgery. 8. Wear loose pants or shorts. They should be loose enough to fit over bandages, boots, and casts. 9. Don't wear slip-on shoes. Sneakers are preferred. 10. Bring your boot with you to the surgery center/hospital.  Also bring crutches or a walker if your physician has prescribed it for you.  If you do not have this equipment, it will be provided for you after surgery. 11. If you have not been contacted by the surgery center/hospital by the day before your surgery, call to confirm the date and time of your surgery. 12. Leave-time from work may vary depending on the type of surgery you have.  Appropriate arrangements should be made prior to surgery with your employer. 13. Prescriptions will be provided immediately following surgery by your doctor.  Fill these as soon as possible after surgery and take the medication as directed. Pain medications will not be refilled on weekends and must be approved by the doctor. 14. Remove nail polish on the operative foot and avoid getting pedicures prior to surgery. 15. Wash the night before surgery.  The night before surgery wash the foot and leg well with water and the antibacterial soap provided. Be sure to pay special attention to beneath the toenails and in between the toes.  Wash for at least three (3) minutes. Rinse thoroughly with water and dry well with a towel.  Perform this wash unless told not to do so by your physician.  Enclosed: 1 Ice pack (please put in freezer the night before surgery)   1 Hibiclens skin cleaner     Pre-op instructions  If you have any questions regarding the instructions, please do not hesitate to call our office.  [place]: [address], , [place] [address] -- [phone]  [place]: [address] [place], [place] [address] -- [phone]  [place]: [address], [place], [place] [address] -- [phone]   Website: [email or web address] 

## 2020-02-28 ENCOUNTER — Ambulatory Visit (HOSPITAL_COMMUNITY): Admission: RE | Admit: 2020-02-28 | Payer: No Typology Code available for payment source | Source: Ambulatory Visit
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
## 2020-03-06 ENCOUNTER — Other Ambulatory Visit: Payer: Self-pay | Admitting: Podiatry

## 2020-03-06 DIAGNOSIS — M79676 Pain in unspecified toe(s): Secondary | ICD-10-CM

## 2020-03-06 DIAGNOSIS — T148XXA Other injury of unspecified body region, initial encounter: Secondary | ICD-10-CM

## 2020-03-07 ENCOUNTER — Encounter: Payer: Self-pay | Admitting: Family

## 2020-03-07 ENCOUNTER — Telehealth: Payer: Self-pay

## 2020-03-07 ENCOUNTER — Other Ambulatory Visit: Payer: Self-pay | Admitting: Family

## 2020-03-07 DIAGNOSIS — J0100 Acute maxillary sinusitis, unspecified: Secondary | ICD-10-CM

## 2020-03-07 MED ORDER — AMOXICILLIN-POT CLAVULANATE 875-125 MG PO TABS: 1.0000 | ORAL_TABLET | Freq: Two times a day (BID) | ORAL | 0 refills | Status: DC

## 2020-03-07 NOTE — Telephone Encounter (Signed)
DOS 03/14/20  PERONEAL TENDON REPAIR RT - 28200 ATFL REPAIR RT - 27695  RECEIVED FAX FROM [facility]  CPT 28200 & 27695 APPROVED, AUTH # [id number] GOOD FROM 03/14/2020 - 04/11/2020

## 2020-03-14 ENCOUNTER — Other Ambulatory Visit: Payer: Self-pay | Admitting: Podiatry

## 2020-03-14 ENCOUNTER — Encounter: Payer: Self-pay | Admitting: Podiatry

## 2020-03-14 DIAGNOSIS — S96911D Strain of unspecified muscle and tendon at ankle and foot level, right foot, subsequent encounter: Secondary | ICD-10-CM

## 2020-03-14 MED ORDER — CEPHALEXIN 500 MG PO CAPS: 500.0000 mg | ORAL_CAPSULE | Freq: Three times a day (TID) | ORAL | 0 refills | Status: DC

## 2020-03-14 MED ORDER — OXYCODONE-ACETAMINOPHEN 10-325 MG PO TABS: 1.0000 | ORAL_TABLET | Freq: Four times a day (QID) | ORAL | 0 refills | Status: DC | PRN

## 2020-03-14 MED ORDER — PROMETHAZINE HCL 25 MG PO TABS
25.0000 mg | ORAL_TABLET | Freq: Three times a day (TID) | ORAL | 0 refills | Status: DC | PRN
Start: 2020-03-14 — End: 2020-07-23

## 2020-03-14 NOTE — Progress Notes (Signed)
Postop medications sent 

## 2020-03-15 ENCOUNTER — Telehealth: Payer: Self-pay | Admitting: *Deleted

## 2020-03-15 NOTE — Telephone Encounter (Signed)
Called patient and stated that I was calling to check on the patient after having surgery with Dr Ardelle Anton on Wednesday March 14, 2020 and the patient stated that she had a severe headache when she woke up and has since eased up today and is doing over all ok and the nerve block is wearing off and I stated to call the office if any concerns or questions and that we would see patient next week for post op appointment. Misty Stanley

## 2020-03-16 ENCOUNTER — Other Ambulatory Visit: Payer: Self-pay | Admitting: Podiatry

## 2020-03-16 ENCOUNTER — Other Ambulatory Visit: Payer: Self-pay

## 2020-03-16 ENCOUNTER — Ambulatory Visit
Admission: EM | Admit: 2020-03-16 | Discharge: 2020-03-16 | Disposition: A | Discharge status: Home / Self Care | Payer: No Typology Code available for payment source | Attending: Emergency Medicine | Admitting: Emergency Medicine

## 2020-03-16 ENCOUNTER — Encounter: Payer: Self-pay | Admitting: Podiatry

## 2020-03-16 ENCOUNTER — Telehealth: Payer: Self-pay | Admitting: Podiatry

## 2020-03-16 DIAGNOSIS — T7840XA Allergy, unspecified, initial encounter: Secondary | ICD-10-CM

## 2020-03-16 DIAGNOSIS — L299 Pruritus, unspecified: Secondary | ICD-10-CM

## 2020-03-16 DIAGNOSIS — R062 Wheezing: Secondary | ICD-10-CM

## 2020-03-16 MED ORDER — DOXYCYCLINE HYCLATE 100 MG PO TABS
100.0000 mg | ORAL_TABLET | Freq: Two times a day (BID) | ORAL | 0 refills | Status: DC
Start: 2020-03-16 — End: 2020-07-23

## 2020-03-16 MED ORDER — PREDNISONE 20 MG PO TABS: 20.0000 mg | ORAL_TABLET | Freq: Two times a day (BID) | ORAL | 0 refills | Status: AC

## 2020-03-16 MED ORDER — HYDROCODONE-ACETAMINOPHEN 5-325 MG PO TABS: 1.0000 | ORAL_TABLET | Freq: Four times a day (QID) | ORAL | 0 refills | Status: DC | PRN

## 2020-03-16 MED ORDER — DEXAMETHASONE SODIUM PHOSPHATE 10 MG/ML IJ SOLN
10.0000 mg | Freq: Once | INTRAMUSCULAR | Status: AC
  Administered 2020-03-16: 10 mg via INTRAMUSCULAR

## 2020-03-16 NOTE — Telephone Encounter (Signed)
I called the patient to discuss. Went to urgent care for mild wheezing and itching. Given steroids. Will change pain medications and antibiotics. She has been on Norco before and doxycyline without issues so we will do this. If she has any further issues to stop immediately and return to the ER.

## 2020-03-16 NOTE — Telephone Encounter (Signed)
Pt is a PO 03/14/20

## 2020-03-16 NOTE — Telephone Encounter (Signed)
Pt called stating she may be having a reaction to her medication she stated that she is itching after taking the medication and she does not know which one shes having the reaction from due to the fact shes taking both at the same time please assist

## 2020-03-16 NOTE — Discharge Instructions (Signed)
Decadron shot given in office Rest push fluids Prednisone prescribed.  Take as directed Use benadryl as needed for itching Call on-call surgeon to switch pain meds and/or antibiotic Return or go to the ED if you have any new or worsening symptoms such as difficulty breathing, shortness of breath, chest pain, nausea, vomiting, throat tightness or swelling, tongue swelling or tingling, worsening lip or facial swelling, abdominal pain, changes in bowel or bladder habits, no improvement despite medications, etc..Marland Kitchen

## 2020-03-16 NOTE — Telephone Encounter (Signed)
I spoke with pt and she states she is having a reaction to the pain medication or the antibiotic, she is taking them both at the same time and is having itching and wheezing. I told pt to take Benadryl and get someone to get her to the ED or urgent care. Pt asked if her doctor was going to change her pain medication and I told her to get the breathing under control 1st.

## 2020-03-16 NOTE — ED Triage Notes (Signed)
Provider triage  

## 2020-03-16 NOTE — ED Provider Notes (Signed)
[facility]   [id number] 03/16/20 Arrival Time: 1534  Cc: Allergic reaction  SUBJECTIVE:  Jenny Parsons is a 44 y.o. female who presents with possible allergic reaction that began today.  Complains of itching and slight wheezing.  Began after taking medication follow a procedure on her RT LE 2 days ago.  Was prescribed keflex and percocet.  Denies alleviating or aggravating factors.  Denies previous symptoms in the past.   Denies fever, chills, nausea, vomiting, erythema, swollen glands, oral manifestations such as throat swelling/ tingling, mouth swelling/ tingling, tongue swelling/tingling, dyspnea, SOB, chest pain, abdominal pain, changes in bowel or bladder function.     ROS: As per HPI.  All other pertinent ROS negative.     Past Medical History:  Diagnosis Date  . Hypertension    Past Surgical History:  Procedure Laterality Date  . TUBAL LIGATION     Allergies  Allergen Reactions  . Ramipril Cough   No current facility-administered medications on file prior to encounter.   Current Outpatient Medications on File Prior to Encounter  Medication Sig Dispense Refill  . hydrochlorothiazide (HYDRODIURIL) 25 MG tablet Take 1 tablet (25 mg total) by mouth daily. 90 tablet 3  . ibuprofen (ADVIL) 800 MG tablet Take 1 tablet (800 mg total) by mouth every 8 (eight) hours as needed. 30 tablet 0  . promethazine (PHENERGAN) 25 MG tablet Take 1 tablet (25 mg total) by mouth every 8 (eight) hours as needed for nausea or vomiting. 20 tablet 0    Social History   Socioeconomic History  . Marital status: Married    Spouse name: Not on file  . Number of children: Not on file  . Years of education: Not on file  . Highest education level: Not on file  Occupational History  . Not on file  Tobacco Use  . Smoking status: Never Smoker  . Smokeless tobacco: Never Used  Vaping Use  . Vaping Use: Never used  Substance and Sexual Activity  . Alcohol use: No  . Drug use:  No  . Sexual activity: Yes    Birth control/protection: Surgical  Other Topics Concern  . Not on file  Social History Narrative  . Not on file   Social Determinants of Health   Financial Resource Strain:   . Difficulty of Paying Living Expenses:   Food Insecurity:   . Worried About Programme researcher, broadcasting/film/video in the Last Year:   . Barista in the Last Year:   Transportation Needs:   . Freight forwarder (Medical):   Marland Kitchen Lack of Transportation (Non-Medical):   Physical Activity:   . Days of Exercise per Week:   . Minutes of Exercise per Session:   Stress:   . Feeling of Stress :   Social Connections:   . Frequency of Communication with Friends and Family:   . Frequency of Social Gatherings with Friends and Family:   . Attends Religious Services:   . Active Member of Clubs or Organizations:   . Attends Banker Meetings:   Marland Kitchen Marital Status:   Intimate Partner Violence:   . Fear of Current or Ex-Partner:   . Emotionally Abused:   Marland Kitchen Physically Abused:   . Sexually Abused:    Family History  Problem Relation Age of Onset  . Hypertension Mother   . Breast cancer Paternal Grandmother   . Cancer Neg Hx   . Diabetes Neg Hx   . Stroke Neg Hx  OBJECTIVE:  Vitals:   03/16/20 1547 03/16/20 1553  BP:  (!) 166/111  Pulse: 77   Resp: 16   Temp: 98.1 F (36.7 C)   TempSrc: Oral   SpO2: 96%      General appearance: Alert, speaking in full sentences without difficulty HEENT:NCAT; no obvious facial swelling; Ears: EACs clear, TMs pearly gray; Eyes: PERRL.  EOM grossly intact. Nose: nares patent without rhinorrhea; Throat: tonsils nonerythematous or enlarged, uvula midline Neck: supple without LAD Lungs: slight expiratory wheezes over LT lung; normal respiratory effort; no labored respirations Heart: regular rate and rhythm.   Skin: warm and dry Psychological: alert and cooperative; normal mood and affect  ASSESSMENT & PLAN:  1. Allergic reaction,  initial encounter   2. Itching   3. Wheezing     Meds ordered this encounter  Medications  . predniSONE (DELTASONE) 20 MG tablet    Sig: Take 1 tablet (20 mg total) by mouth 2 (two) times daily with a meal for 5 days.    Dispense:  10 tablet    Refill:  0    Order Specific Question:   Supervising Provider    Answer:   Raylene Everts [[id number]]  . dexamethasone (DECADRON) injection 10 mg   Decadron shot given in office Rest push fluids Prednisone prescribed.  Take as directed Use benadryl as needed for itching Call on-call surgeon to switch pain meds and/or antibiotic Return or go to the ED if you have any new or worsening symptoms such as difficulty breathing, shortness of breath, chest pain, nausea, vomiting, throat tightness or swelling, tongue swelling or tingling, worsening lip or facial swelling, abdominal pain, changes in bowel or bladder habits, no improvement despite medications, etc...   Reviewed expectations re: course of current medical issues. Questions answered. Outlined signs and symptoms indicating need for more acute intervention. Patient verbalized understanding. After Visit Summary given.          Lestine Box, PA-C 03/16/20 [phone]

## 2020-03-17 ENCOUNTER — Telehealth: Payer: Self-pay | Admitting: *Deleted

## 2020-03-17 NOTE — Telephone Encounter (Signed)
Patient called yesterday and had to go to urgent care for mild wheezing and itching and per Dr Ardelle Anton called two new prescriptions in to the pharmacy and I called the patient today and the patient does feel a lot better and I stated that Dr Ardelle Anton was on call today and patient could also go through my chart and that we would see patient on Monday June 28th, 2021 for post op visit. Jenny Parsons

## 2020-03-19 ENCOUNTER — Encounter: Payer: Self-pay | Admitting: Podiatry

## 2020-03-19 ENCOUNTER — Other Ambulatory Visit: Payer: Self-pay

## 2020-03-19 ENCOUNTER — Ambulatory Visit (INDEPENDENT_AMBULATORY_CARE_PROVIDER_SITE_OTHER): Payer: No Typology Code available for payment source

## 2020-03-19 ENCOUNTER — Ambulatory Visit (INDEPENDENT_AMBULATORY_CARE_PROVIDER_SITE_OTHER): Payer: No Typology Code available for payment source | Admitting: Podiatry

## 2020-03-19 VITALS — Temp 97.7°F

## 2020-03-19 DIAGNOSIS — S96911D Strain of unspecified muscle and tendon at ankle and foot level, right foot, subsequent encounter: Secondary | ICD-10-CM

## 2020-03-19 DIAGNOSIS — T148XXA Other injury of unspecified body region, initial encounter: Secondary | ICD-10-CM | POA: Diagnosis not present

## 2020-03-19 DIAGNOSIS — M722 Plantar fascial fibromatosis: Secondary | ICD-10-CM

## 2020-03-19 NOTE — Progress Notes (Signed)
Subjective: Jenan Ellegood is a 44 y.o. is seen today in office s/p right peroneal tendon repair, ATFL repair preformed on 03/14/2020.  She states that she is doing well.  She did not go to urgent care for wheezing, itching.  She was started on new medications and she has done well and side effects.  She is asking for injection to the left heel she is getting discomfort in the bottom of her left heel today.  This is been ongoing intermittent issue.  Denies any systemic complaints such as fevers, chills, nausea, vomiting. No calf pain, chest pain, shortness of breath.   Objective: General: No acute distress, AAOx3  DP/PT pulses palpable 2/4, CRT < 3 sec to all digits.  Protective sensation intact. Motor function intact.  RIGHT foot: Incision is well coapted without any evidence of dehiscence with sutures, staples intact. There is no surrounding erythema, ascending cellulitis, fluctuance, crepitus, malodor, drainage/purulence. There is mild edema around the surgical site. There is mild pain along the surgical site.  LEFT: Tenderness on the plantar medial tubercle of the calcaneus at the insertion of plantar fascia.  Plantar fascial appears intact.  Mild discomfort the posterior calcaneus along the Achilles tendon as well.  Achilles tendon, plantar fascia appears to be intact. No other areas of tenderness to bilateral lower extremities.  No other open lesions or pre-ulcerative lesions.  No pain with calf compression, swelling, warmth, erythema.   Assessment and Plan:  Status post right ankle surgery, doing well with no complications; left plantar fasciitis  -Treatment options discussed including all alternatives, risks, and complications -X-rays obtained and reviewed.  No evidence of acute fracture identified today.  Osteophytes present both medial lateral aspect ankle. -Incisions healing well any signs of infection.  Antibiotic ointment and dressing applied.  A well-padded below-knee  fiberglass cast was applied making sure to pad all bony prominences.  81 mg aspirin daily. -Continue nonweightbearing right lower extremity -Ice/elevation -Pain medication as needed. -Steroid injection performed left heel.  See procedure note below. -Monitor for any clinical signs or symptoms of infection and DVT/PE and directed to call the office immediately should any occur or go to the ER. -Follow-up as scheduled for cast change and possible suture removal or sooner if any problems arise. In the meantime, encouraged to call the office with any questions, concerns, change in symptoms.   Procedure: Injection Tendon/Ligament Discussed alternatives, risks, complications and verbal consent was obtained.  Location: LEFT plantar fascia at the glabrous junction; medial approach. Skin Prep: Alcohol  Injectate: 0.5cc 0.5% marcaine plain, 0.5 cc 2% lidocaine plain and, 1 cc kenalog 10. Disposition: Patient tolerated procedure well. Injection site dressed with a band-aid.  Post-injection care was discussed and return precautions discussed.    Ovid Curd, DPM

## 2020-03-22 ENCOUNTER — Other Ambulatory Visit: Payer: Self-pay | Admitting: Podiatry

## 2020-03-22 MED ORDER — IBUPROFEN 800 MG PO TABS: 800.0000 mg | ORAL_TABLET | Freq: Three times a day (TID) | ORAL | 0 refills | Status: DC | PRN

## 2020-03-22 MED ORDER — HYDROCODONE-ACETAMINOPHEN 5-325 MG PO TABS: 1.0000 | ORAL_TABLET | Freq: Four times a day (QID) | ORAL | 0 refills | Status: DC | PRN

## 2020-03-28 ENCOUNTER — Ambulatory Visit: Payer: No Typology Code available for payment source

## 2020-03-30 ENCOUNTER — Ambulatory Visit (INDEPENDENT_AMBULATORY_CARE_PROVIDER_SITE_OTHER): Payer: No Typology Code available for payment source | Admitting: Podiatry

## 2020-03-30 ENCOUNTER — Other Ambulatory Visit: Payer: Self-pay

## 2020-03-30 ENCOUNTER — Ambulatory Visit (INDEPENDENT_AMBULATORY_CARE_PROVIDER_SITE_OTHER): Payer: No Typology Code available for payment source

## 2020-03-30 DIAGNOSIS — S96911D Strain of unspecified muscle and tendon at ankle and foot level, right foot, subsequent encounter: Secondary | ICD-10-CM | POA: Diagnosis not present

## 2020-03-30 DIAGNOSIS — T148XXA Other injury of unspecified body region, initial encounter: Secondary | ICD-10-CM | POA: Diagnosis not present

## 2020-03-30 DIAGNOSIS — E538 Deficiency of other specified B group vitamins: Secondary | ICD-10-CM | POA: Diagnosis not present

## 2020-03-30 DIAGNOSIS — M722 Plantar fascial fibromatosis: Secondary | ICD-10-CM

## 2020-03-30 MED ORDER — CYANOCOBALAMIN 1000 MCG/ML IJ SOLN
1000.0000 ug | INTRAMUSCULAR | Status: AC
  Administered 2020-03-30: 1000 ug via INTRAMUSCULAR

## 2020-03-30 NOTE — Progress Notes (Signed)
Pt here for monthly B12 injection per Ria Clock, FNP  B12 given IM left deltoid and pt tolerated injection well.  Pt to schedule next monthly injection upon check out.  Dr Jonny Ruiz: can you please co-sign since Vernona Rieger is out of office.  Thank you.

## 2020-04-02 ENCOUNTER — Encounter: Payer: Self-pay | Admitting: Podiatry

## 2020-04-02 NOTE — Progress Notes (Signed)
  Subjective:  Patient ID: Jenny Parsons, female    DOB: 07/18/1976,  MRN: [id number]  Chief Complaint  Patient presents with  . Routine Post Op    POV #2 DOS 03/14/20 RT ANKLE REPAIR OF LATERAL ANKLE LIGAMENT (ATFL) & REPAIR OF PERONEAL BREVIS TENDON, PLATLET RICH PLASMA INJECTION/DR WAGONERS PT      44 y.o. female returns for post-op check.  She states that she is doing well.  She states that she has been nonweightbearing to the right lower extremity in a cast.  She states that she is here for cast exchange as well as evaluate the incision.  She denies any other acute complaints.  She has been keeping the cast clean dry and intact.  Review of Systems: Negative except as noted in the HPI. Denies N/V/F/Ch.  Past Medical History:  Diagnosis Date  . Hypertension     Current Outpatient Medications:  .  doxycycline (VIBRA-TABS) 100 MG tablet, Take 1 tablet (100 mg total) by mouth 2 (two) times daily., Disp: 20 tablet, Rfl: 0 .  hydrochlorothiazide (HYDRODIURIL) 25 MG tablet, Take 1 tablet (25 mg total) by mouth daily., Disp: 90 tablet, Rfl: 3 .  HYDROcodone-acetaminophen (NORCO/VICODIN) 5-325 MG tablet, Take 1-2 tablets by mouth every 6 (six) hours as needed., Disp: 20 tablet, Rfl: 0 .  ibuprofen (ADVIL) 800 MG tablet, Take 1 tablet (800 mg total) by mouth every 8 (eight) hours as needed., Disp: 30 tablet, Rfl: 0 .  promethazine (PHENERGAN) 25 MG tablet, Take 1 tablet (25 mg total) by mouth every 8 (eight) hours as needed for nausea or vomiting., Disp: 20 tablet, Rfl: 0  Current Facility-Administered Medications:  .  cyanocobalamin ((VITAMIN B-12)) injection 1,000 mcg, 1,000 mcg, Intramuscular, Q30 days, Olive Bass, FNP, 1,000 mcg at 03/30/20 0845  Social History   Tobacco Use  Smoking Status Never Smoker  Smokeless Tobacco Never Used    Allergies  Allergen Reactions  . Ramipril Cough  . Keflex [Cephalexin]     Wheezing/itching   . Percocet  [Oxycodone-Acetaminophen]     Wheezing/itching   Objective:  There were no vitals filed for this visit. There is no height or weight on file to calculate BMI. Constitutional Well developed. Well nourished.  Vascular Foot warm and well perfused. Capillary refill normal to all digits.   Neurologic Normal speech. Oriented to person, place, and time. Epicritic sensation to light touch grossly present bilaterally.  Dermatologic Skin healing well without signs of infection. Skin edges well coapted without signs of infection.  Orthopedic: Tenderness to palpation noted about the surgical site.   Radiographs: None Assessment:   1. Tear of tendon of right ankle, subsequent encounter   2. Plantar fasciitis   3. Ligament tear    Plan:  Patient was evaluated and treated and all questions answered.  S/p foot surgery right -Progressing as expected post-operatively. -XR: None -WB Status: Nonweightbearing in right lower extremity in a cast -Sutures: Intact.  No clinical signs of dehiscence noted.  No clinical signs of infection noted.  The sutures were not ready to come out therefore there will be taken out during next clinical visit. -Medications: None -Foot redressed.  Patient was placed back in a cast.  The cast was applied in standard technique.  No complications noted.  Adequate space noted at the digits as well as the calf.  No calf pain.  No follow-ups on file.

## 2020-04-12 ENCOUNTER — Other Ambulatory Visit: Payer: Self-pay

## 2020-04-12 ENCOUNTER — Ambulatory Visit: Payer: No Typology Code available for payment source

## 2020-04-12 ENCOUNTER — Ambulatory Visit (INDEPENDENT_AMBULATORY_CARE_PROVIDER_SITE_OTHER): Payer: No Typology Code available for payment source | Admitting: Podiatry

## 2020-04-12 DIAGNOSIS — S96911D Strain of unspecified muscle and tendon at ankle and foot level, right foot, subsequent encounter: Secondary | ICD-10-CM | POA: Diagnosis not present

## 2020-04-12 DIAGNOSIS — M722 Plantar fascial fibromatosis: Secondary | ICD-10-CM

## 2020-04-16 NOTE — Progress Notes (Signed)
Subjective: Jenny Parsons is a 44 y.o. is seen today in office s/p right peroneal tendon repair, ATFL repair preformed on 03/14/2020.  She presents today for possible suture removal as well as for cast change.  She has been doing well not taking significant pain medication.  She has no concerns today.  Denies any fevers, chills, nausea, vomiting.  No calf pain, chest pain, shortness of breath.  Objective: General: No acute distress, AAOx3  DP/PT pulses palpable 2/4, CRT < 3 sec to all digits.  Protective sensation intact. Motor function intact.  RIGHT foot: Incision is well coapted without any evidence of dehiscence with sutures, staples intact.  There is mild edema but there is no surrounding erythema, drainage or pus or any signs of infection at today.  Is no tenderness palpation today on the surgical site.  No other areas of tenderness to bilateral lower extremities.  No other open lesions or pre-ulcerative lesions.  No pain with calf compression, swelling, warmth, erythema.   Assessment and Plan:  Status post right ankle surgery, doing well with no complications; left plantar fasciitis  -Treatment options discussed including all alternatives, risks, and complications -Cast was removed today.  I removed the sutures today both staples intact.  Antibiotic ointment and a bandage applied.  A well-padded below-knee fiberglass cast was applied making sure to pad all bony promises.  Continue 81 mg daily aspirin. -Continue nonweightbearing right lower extremity -Ice/elevation -Pain medication as needed. -Monitor for any clinical signs or symptoms of infection and DVT/PE and directed to call the office immediately should any occur or go to the ER. -Follow-up as scheduled for cast change and suture removal or sooner if any problems arise. In the meantime, encouraged to call the office with any questions, concerns, change in symptoms.   Return in about 10 days (around 04/22/2020).  Vivi Barrack DPM

## 2020-04-20 ENCOUNTER — Other Ambulatory Visit: Payer: Self-pay

## 2020-04-20 ENCOUNTER — Ambulatory Visit (INDEPENDENT_AMBULATORY_CARE_PROVIDER_SITE_OTHER): Payer: No Typology Code available for payment source | Admitting: Podiatry

## 2020-04-20 DIAGNOSIS — M722 Plantar fascial fibromatosis: Secondary | ICD-10-CM

## 2020-04-20 DIAGNOSIS — S96911D Strain of unspecified muscle and tendon at ankle and foot level, right foot, subsequent encounter: Secondary | ICD-10-CM

## 2020-04-24 ENCOUNTER — Encounter: Payer: No Typology Code available for payment source | Admitting: Podiatry

## 2020-04-24 NOTE — Progress Notes (Signed)
Subjective: Jenny Parsons is a 44 y.o. is seen today in office s/p right peroneal tendon repair, ATFL repair preformed on 03/14/2020.  She presents today for cast removal as well as staple removal.  She said that she has been doing well not taking any pain medication.  She has no new concerns today.  Denies any fevers, chills, nausea, vomiting.  No calf pain, chest pain, shortness of breath.   Objective: General: No acute distress, AAOx3  DP/PT pulses palpable 2/4, CRT < 3 sec to all digits.  Protective sensation intact. Motor function intact.  RIGHT foot: Incision is well coapted without any evidence of dehiscence with staples intact.  There is still mild edema but appears to be improved compared to what it was prior to surgery.  There is no erythema associated with swelling.  No significant discomfort to palpation surgical site.  At this point the ankle appears to be stable. No other areas of tenderness to bilateral lower extremities.  No other open lesions or pre-ulcerative lesions.  No pain with calf compression, swelling, warmth, erythema.   Assessment and Plan:  Status post right ankle surgery, doing well with no complications  -Treatment options discussed including all alternatives, risks, and complications -Cast was removed today.  I remove the staples without complications.  Steri-Strips were applied for reinforcement followed by antibiotic ointment and a bandage.  She can start to shower tomorrow wash with soap and water.  Apply antibiotic ointment afterwards.  Transition to cam boot today we discussed intervention motion exercises.  She can start partial weightbearing as able and I will see her back in 2 weeks that point likely start formal physical therapy.  Return in about 2 weeks (around 05/04/2020).  Vivi Barrack DPM

## 2020-05-03 ENCOUNTER — Other Ambulatory Visit: Payer: Self-pay

## 2020-05-03 ENCOUNTER — Ambulatory Visit (INDEPENDENT_AMBULATORY_CARE_PROVIDER_SITE_OTHER): Payer: No Typology Code available for payment source | Admitting: Podiatry

## 2020-05-03 DIAGNOSIS — S96911D Strain of unspecified muscle and tendon at ankle and foot level, right foot, subsequent encounter: Secondary | ICD-10-CM

## 2020-05-03 DIAGNOSIS — T148XXA Other injury of unspecified body region, initial encounter: Secondary | ICD-10-CM

## 2020-05-03 NOTE — Progress Notes (Signed)
Subjective: Hillarie Harrigan is a 44 y.o. is seen today in office s/p right peroneal tendon repair, ATFL repair preformed on 03/14/2020. Overall doing well. Taking ibuprofen for pain. Swelling has improved some. Denies any fevers, chills, nausea, vomiting.  No calf pain, chest pain, shortness of breath.   Objective: General: No acute distress, AAOx3  DP/PT pulses palpable 2/4, CRT < 3 sec to all digits.  Protective sensation intact. Motor function intact.  RIGHT foot: Incision is well coapted without any evidence of dehiscence with a scar formed.  There is still edema to the lateral aspect of the ankle but improved somewhat. No erythema or warmth. There is mild tenderness palpation of surgical site. Noted some discomfort in the forefoot diffusely but no area pinpoint tenderness. No other open lesions or pre-ulcerative lesions.  No pain with calf compression, swelling, warmth, erythema.   Assessment and Plan:  Status post right ankle surgery, doing well with no complications  -Treatment options discussed including all alternatives, risks, and complications -At this point discussed transition to partial weightbearing. Order to work on this over the next couple weeks. We will start physical therapy likely next week and a referral was placed today for [facility]. As she progresses she can start the transition to full weightbearing in the cam boot before progressing to a shoe. -Continue ice elevate -Compression anklet  No follow-ups on file.  Vivi Barrack DPM

## 2020-05-04 ENCOUNTER — Ambulatory Visit: Payer: No Typology Code available for payment source

## 2020-05-08 ENCOUNTER — Encounter: Payer: Self-pay | Admitting: Podiatry

## 2020-05-09 ENCOUNTER — Telehealth: Payer: Self-pay | Admitting: Podiatry

## 2020-05-09 NOTE — Telephone Encounter (Signed)
Pt calling to follow up on previous request for Physical therapy. Pt would like to have the referral sent to [facility] [place].  When the patient called them to schedule her appt they stated they had not received the referral from our office yet.

## 2020-05-09 NOTE — Telephone Encounter (Signed)
Pt called and wanted update on referral to pt

## 2020-05-11 NOTE — Telephone Encounter (Signed)
Called patient and left a message for the patient to let her know that I sent the [facility] to [place] for physical therapy. Misty Stanley

## 2020-05-11 NOTE — Addendum Note (Signed)
Addended by: Hadley Pen R on: 05/11/2020 04:12 PM   Modules accepted: Orders

## 2020-05-16 ENCOUNTER — Encounter: Payer: Self-pay | Admitting: Family

## 2020-05-16 ENCOUNTER — Other Ambulatory Visit: Payer: Self-pay | Admitting: Family

## 2020-05-16 MED ORDER — FLUCONAZOLE 150 MG PO TABS: ORAL_TABLET | ORAL | 0 refills | Status: DC

## 2020-05-21 ENCOUNTER — Other Ambulatory Visit: Payer: Self-pay

## 2020-05-21 ENCOUNTER — Encounter: Payer: Self-pay | Admitting: Podiatry

## 2020-05-21 ENCOUNTER — Ambulatory Visit (INDEPENDENT_AMBULATORY_CARE_PROVIDER_SITE_OTHER): Payer: No Typology Code available for payment source | Admitting: Podiatry

## 2020-05-21 DIAGNOSIS — T148XXA Other injury of unspecified body region, initial encounter: Secondary | ICD-10-CM

## 2020-05-21 DIAGNOSIS — M722 Plantar fascial fibromatosis: Secondary | ICD-10-CM

## 2020-05-21 DIAGNOSIS — S96911D Strain of unspecified muscle and tendon at ankle and foot level, right foot, subsequent encounter: Secondary | ICD-10-CM

## 2020-05-22 NOTE — Progress Notes (Signed)
Subjective: Jenny Parsons is a 44 y.o. is seen today in office s/p right peroneal tendon repair, ATFL repair preformed on 03/14/2020.  States that she is improving and she is starting physical therapy next week.  She is using the boot still and one crutch.  She is asking for steroid injection for the left heel for the plantar fasciitis.  Otherwise been doing well no recent injury or falls. Denies any fevers, chills, nausea, vomiting.  No calf pain, chest pain, shortness of breath.   Objective: General: No acute distress, AAOx3  DP/PT pulses palpable 2/4, CRT < 3 sec to all digits.  Protective sensation intact. Motor function intact.  RIGHT foot: Incision is well coapted without any evidence of dehiscence with a scar formed.  There is minimal tenderness palpation surgical site and ankle stable.  There is still edema present without any associated erythema or warmth.  LEFT foot: Reoccurrence of tenderness on the plantar medial tubercle of the calcaneus with insertion of plantar fascial.  Plantar fascial appears to be intact.  There is no other areas of discomfort identified in the left foot.  Flexor, extensor tendons appear to be intact.  MMT 5/5. No pain with calf compression, swelling, warmth, erythema.   Assessment and Plan:  Status post right ankle surgery, doing well with no complications; left foot plantar fasciitis  -Treatment options discussed including all alternatives, risks, and complications -For the left foot see procedure note below.  Sterile injection was performed. -On regard to the right ankle she is doing well.  Start physical therapy next week.  As she is transitioning and becoming stronger she can start to weight-bear as tolerated in the cam boot before proceeding with transition to regular shoe.  She can start to drive short distances as well in a regular shoe.  Continue compression, elevation.  Procedure: Injection Tendon/Ligament Discussed alternatives, risks,  complications and verbal consent was obtained.  Location: Left plantar fascia at the glabrous junction; medial approach. Skin Prep: Alcohol. Injectate: 0.5cc 0.5% marcaine plain, 0.5 cc 2% lidocaine plain and, 1 cc kenalog 10. Disposition: Patient tolerated procedure well. Injection site dressed with a band-aid.  Post-injection care was discussed and return precautions discussed.   Vivi Barrack DPM

## 2020-05-23 ENCOUNTER — Other Ambulatory Visit: Payer: Self-pay

## 2020-05-23 ENCOUNTER — Encounter (HOSPITAL_COMMUNITY): Payer: Self-pay | Admitting: Physical Therapy

## 2020-05-23 ENCOUNTER — Ambulatory Visit (HOSPITAL_COMMUNITY): Payer: No Typology Code available for payment source | Attending: Podiatry | Admitting: Physical Therapy

## 2020-05-23 DIAGNOSIS — R2689 Other abnormalities of gait and mobility: Secondary | ICD-10-CM | POA: Diagnosis present

## 2020-05-23 DIAGNOSIS — M25671 Stiffness of right ankle, not elsewhere classified: Secondary | ICD-10-CM | POA: Diagnosis present

## 2020-05-23 DIAGNOSIS — M25571 Pain in right ankle and joints of right foot: Secondary | ICD-10-CM | POA: Diagnosis present

## 2020-05-23 NOTE — Addendum Note (Signed)
Addended by: Bella Kennedy on: 05/23/2020 02:15 PM   Modules accepted: Orders

## 2020-05-23 NOTE — Therapy (Signed)
Jenny Parsons [address] [place], [place], [address] Phone: [phone]   Fax:  [phone]  Physical Therapy Evaluation  Patient Details  Name: Jenny Parsons MRN: [id number] Date of Birth: 02-17-1976 Referring Provider (PT): Jenny Parsons   Encounter Date: 05/23/2020   PT End of Session - 05/23/20 1400    Visit Number 1    Number of Visits 12    Date for PT Re-Evaluation 07/04/20    Authorization Type [employer]    Progress Note Due on Visit 10    PT Start Time 1315    PT Stop Time 1400    PT Time Calculation (min) 45 min    Activity Tolerance Patient tolerated treatment well    Behavior During Therapy Jenny Parsons for tasks assessed/performed           Past Medical History:  Diagnosis Date  . Hypertension     Past Surgical History:  Procedure Laterality Date  . TUBAL LIGATION      There were no vitals filed for this visit.    Subjective Assessment - 05/23/20 1321    Subjective Pt had peroneal repair on the RT on 03/14/2020.  She was casted for about 6 weeks and then put into a CAM boot.  She is approved to slowly wean away from the CAM boot.    Pertinent History Lt heel spur which she had an injection for.    Limitations Standing;Walking;House hold activities    How long can you sit comfortably? no problem    How long can you stand comfortably? in the cam boot 5-10 minutes.    How long can you walk comfortably? with the cam boot the longest she has walked has been 10 minutes;    Patient Stated Goals Walk without the cam boot, To get to where she can work in her yard and go on long walks    Currently in Pain? No/denies   Highest pain in her ankle has been a 7/10 (end of the day)   Aggravating Factors  activty    Pain Relieving Factors ibuprofen, icing    Effect of Pain on Daily Activities limits              Jenny Parsons PT Assessment - 05/23/20 0001      Assessment   Medical Diagnosis Rt ankle fx    Referring  Provider (PT) Jenny Parsons    Onset Date/Surgical Date 03/14/20   accident was on 07/06/2020   Next MD Visit 06/05/2020    Prior Therapy none      Precautions   Precautions None      Restrictions   Weight Bearing Restrictions Yes    RUE Weight Bearing Weight bearing as tolerated      Home Environment   Living Environment Private residence    Type of Home House    Home Access Stairs to enter    Entrance Stairs-Number of Steps 4    Home Layout One level      Prior Function   Level of Independence Independent    Vocation Full time employment    Vocation Requirements lab tech, up and down OR runs.    Leisure walk, remodeling home.       Cognition   Overall Cognitive Status Within Functional Limits for tasks assessed      Observation/Other Assessments   Observations significant swelling in forefoot as well as ankle, slight in LE     Focus on Therapeutic Outcomes (  FOTO)  54 affected 46%      Figure 8 Edema   Figure 8 - Right  58.3    Figure 8 - Left  55.3      AROM   Right Ankle Dorsiflexion 8    Right Ankle Plantar Flexion 42    Right Ankle Inversion 30    Right Ankle Eversion 8    Left Ankle Dorsiflexion 18    Left Ankle Plantar Flexion 52    Left Ankle Inversion 20    Left Ankle Eversion 35      Strength   Right Ankle Dorsiflexion 3+/5    Right Ankle Plantar Flexion 2+/5    Right Ankle Inversion 2+/5    Right Ankle Eversion 3-/5    Left Ankle Dorsiflexion 5/5    Left Ankle Plantar Flexion 5/5    Left Ankle Inversion 5/5    Left Ankle Eversion 5/5                 treatment:  AROM  10 reps each hold 3 " Isometric 10 reps each hold 5"      Objective measurements completed on examination: See above findings.               PT Education - 05/23/20 1359    Education Details HEP    Person(s) Educated Patient    Methods Explanation;Demonstration;Verbal cues;Handout    Comprehension Verbalized understanding;Returned demonstration              PT Short Term Goals - 05/23/20 1411      PT SHORT TERM GOAL #1   Title PT to be I in HEP to improve ankle strength by 1/2 grade to allow pt to be able to walk with her camboot for 40 minutes without increased pain.    Time 3    Period Weeks    Status New    Target Date 06/13/20      PT SHORT TERM GOAL #2   Title PT to be walking in her home without the camboot or crutches.    Time 3    Period Weeks    Status New      PT SHORT TERM GOAL #3   Title PT to be able to stand for 30 minutes without increased pain to be able to make a quick meal.    Time 3    Period Weeks    Status New             PT Long Term Goals - 05/23/20 1410      PT LONG TERM GOAL #1   Title Pt pain in right ankle to be no greater than a 2/10 to allow her to walk outside without an assistive device or her camboot on.    Time 6    Period Weeks    Status New      PT LONG TERM GOAL #2   Title PT ankle strength to be at least 4+/5 to allow pt to go up and down 4 steps in a reciprocal manner with 1 hand rail.    Time 6    Period Weeks    Status New      PT LONG TERM GOAL #3   Title PT to be able to tolerate walking /stand for an hour at a time inside to allow pt to complete shopping in comfort and return to work.    Time 6    Period Weeks      PT  LONG TERM GOAL #4   Title PT to be able to single leg stance for at least 30 seconds bilaterally to reduce risk of reinjuring    Time 6    Period Weeks                  Plan - 05/23/20 1403    Clinical Impression Statement Jenny Parsons had surgery on her Rt peroneal mm on 03/14/2020; she was casted for 6 weeks and then placed into a cam boot.  She currently has no restrictions and is being referred to skilled PT to improve her functional ability to allow her to return to work.  Evaluation demonstrated increased swelling, increase pain, decreased ROM, decreased strength and decreased activity tolerance.  Jenny Parsons will benefit from skilled  PT to address these issues and improve her functional ability.    Examination-Activity Limitations Caring for Others;Carry;Locomotion Level;Squat;Stairs;Stand    Examination-Participation Restrictions Cleaning;Community Activity;Driving;Laundry;Shop;Meal Prep    Stability/Clinical Decision Making Stable/Uncomplicated    Clinical Decision Making Low    Rehab Potential Good    PT Frequency 2x / week    PT Duration 6 weeks    PT Treatment/Interventions Manual techniques;Manual lymph drainage;Compression bandaging;Therapeutic activities;Therapeutic exercise;Functional mobility training;Stair training;Gait training    PT Next Visit Plan begin sitting baps, toe cruncha  and tband exercises. Give Tband for HEP    PT Home Exercise Plan ROM; isometrics, self manual and weaning from cam boot           Patient will benefit from skilled therapeutic intervention in order to improve the following deficits and impairments:  Abnormal gait, Decreased activity tolerance, Decreased balance, Decreased range of motion, Decreased strength, Difficulty walking, Decreased skin integrity, Pain, Increased edema  Visit Diagnosis: Other abnormalities of gait and mobility  Stiffness of right ankle, not elsewhere classified  Pain in right ankle and joints of right foot     Problem List Patient Active Problem List   Diagnosis Date Noted  . Heel spur, left 12/11/2019  . Closed fracture of right ankle 07/21/2019  . Trapezius muscle strain, right, initial encounter 05/26/2019  . Cervical paraspinal muscle spasm 05/20/2017  . Allergic rhinitis 02/02/2017  . Depression with anxiety 11/06/2016  . Snoring 11/05/2016  . Essential hypertension, benign 12/22/2011  . Routine general medical examination at a health care facility 12/22/2011  . Hypertension 05/01/2011    Virgina Organ, PT CLT [phone] 05/23/2020, 2:12 PM  [facility] [address] [place], [place],  [address] Phone: [phone]   Fax:  [phone]  Name: Jenny Parsons MRN: [id number] Date of Birth: 05-05-1976

## 2020-05-25 ENCOUNTER — Ambulatory Visit (HOSPITAL_COMMUNITY): Payer: No Typology Code available for payment source

## 2020-05-25 ENCOUNTER — Other Ambulatory Visit: Payer: Self-pay

## 2020-05-25 DIAGNOSIS — R2689 Other abnormalities of gait and mobility: Secondary | ICD-10-CM

## 2020-05-25 DIAGNOSIS — M25671 Stiffness of right ankle, not elsewhere classified: Secondary | ICD-10-CM

## 2020-05-25 DIAGNOSIS — M25571 Pain in right ankle and joints of right foot: Secondary | ICD-10-CM

## 2020-05-25 NOTE — Therapy (Signed)
[place] [facility] [address] [place], [place], [address] Phone: [phone]   Fax:  [phone]  Physical Therapy Treatment  Patient Details  Name: Jenny Parsons MRN: [id number] Date of Birth: [birth year] Referring Provider (PT): Ovid Curd   Encounter Date: 05/25/2020   PT End of Session - 05/25/20 1204    Visit Number 2    Number of Visits 12    Date for PT Re-Evaluation 07/04/20    Authorization Type moses cond    Progress Note Due on Visit 10    PT Start Time 0936    PT Stop Time 1016    PT Time Calculation (min) 40 min    Activity Tolerance Patient tolerated treatment well    Behavior During Therapy [facility] for tasks assessed/performed           Past Medical History:  Diagnosis Date  . Hypertension     Past Surgical History:  Procedure Laterality Date  . TUBAL LIGATION      There were no vitals filed for this visit.   Subjective Assessment - 05/25/20 0939    Subjective No real pain yet today as she hasn't done much; more soreness.    Pertinent History Lt heel spur which she had an injection for.    Limitations Standing;Walking;House hold activities    How long can you sit comfortably? no problem    How long can you stand comfortably? in the cam boot 5-10 minutes.    How long can you walk comfortably? with the cam boot the longest she has walked has been 10 minutes;    Patient Stated Goals Walk without the cam boot, To get to where she can work in her yard and go on long walks              [facility] Adult PT Treatment/Exercise - 05/25/20 0001      Ambulation/Gait   Ambulation/Gait Yes    Ambulation/Gait Assistance 5: Supervision    Ambulation/Gait Assistance Details verbal cues to equalize step length and stance time    Ambulation Distance (Feet) 226 Feet    Assistive device None    Gait Pattern Decreased stance time - right;Decreased step length - left;Step-through pattern;Decreased dorsiflexion -  right;Antalgic    Ambulation Surface Level;Indoor      Exercises   Exercises Ankle      Ankle Exercises: Seated   Towel Crunch --   30 reps   Towel Inversion/Eversion --    Towel Inversion/Eversion Limitations 15 reps    Marble Pickup 30 reps   has personal marble container   Heel Raises Both;10 reps    Toe Raise 10 reps    BAPS Sitting;Level 2;10 reps    BAPS Limitations A/p, M/L, CW, CCW    Heel Slides Right;10 reps    Heel Slides Limitations 5 sec hold      Ankle Exercises: Supine   T-Band PF/DF/INV/EV RTB x10 each            PT Education - 05/25/20 1203    Education Details Reviewed evaluation and goals. Discussed purpose and technique of interventions throughout session. Advanced HEP.    Person(s) Educated Patient    Methods Explanation;Demonstration;Handout    Comprehension Verbalized understanding;Need further instruction            PT Short Term Goals - 05/23/20 1411      PT SHORT TERM GOAL #1   Title PT to be I in HEP to improve ankle  strength by 1/2 grade to allow pt to be able to walk with her camboot for 40 minutes without increased pain.    Time 3    Period Weeks    Status New    Target Date 06/13/20      PT SHORT TERM GOAL #2   Title PT to be walking in her home without the camboot or crutches.    Time 3    Period Weeks    Status New      PT SHORT TERM GOAL #3   Title PT to be able to stand for 30 minutes without increased pain to be able to make a quick meal.    Time 3    Period Weeks    Status New             PT Long Term Goals - 05/23/20 1410      PT LONG TERM GOAL #1   Title Pt pain in right ankle to be no greater than a 2/10 to allow her to walk outside without an assistive device or her camboot on.    Time 6    Period Weeks    Status New      PT LONG TERM GOAL #2   Title PT ankle strength to be at least 4+/5 to allow pt to go up and down 4 steps in a reciprocal manner with 1 hand rail.    Time 6    Period Weeks    Status New       PT LONG TERM GOAL #3   Title PT to be able to tolerate walking /stand for an hour at a time inside to allow pt to complete shopping in comfort and return to work.    Time 6    Period Weeks      PT LONG TERM GOAL #4   Title PT to be able to single leg stance for at least 30 seconds bilaterally to reduce risk of reinjuring    Time 6    Period Weeks              Plan - 05/25/20 1205    Clinical Impression Statement Reviewed initial evaluation and goals with patient. Gait training with no AD, out of CAM boot with tennis shoe on. Progressed therapeutic exercises to include seated heel slides, towel sweeps for inversion/eversion, towel scrunch, marble pick ups, heel/toe raises, BAPS board level 2 for A/P, M/L, CW, and counterclockwise movements and ankle DF, PF, eversion and inversion with red theraband in long sitting. Added long sitting ankle inversion, eversion, DF and PF with red theraband to HEP. Patient stated ankle felt looser; no increase in pain though. Continue with current plan. Progress as able.    Examination-Activity Limitations Caring for Others;Carry;Locomotion Level;Squat;Stairs;Stand    Examination-Participation Restrictions Cleaning;Community Activity;Driving;Laundry;Shop;Meal Prep    Stability/Clinical Decision Making Stable/Uncomplicated    Rehab Potential Good    PT Frequency 2x / week    PT Duration 6 weeks    PT Treatment/Interventions Manual techniques;Manual lymph drainage;Compression bandaging;Therapeutic activities;Therapeutic exercise;Functional mobility training;Stair training;Gait training    PT Next Visit Plan begin sitting baps, toe cruncha  and tband exercises. Give Tband for HEP    PT Home Exercise Plan ROM; isometrics, self manual and weaning from cam boot; 05/25/20 - long sitting ankle inversion, eversion, DF and PF with red theraband           Patient will benefit from skilled therapeutic intervention in order to improve the following  deficits and  impairments:  Abnormal gait, Decreased activity tolerance, Decreased balance, Decreased range of motion, Decreased strength, Difficulty walking, Decreased skin integrity, Pain, Increased edema  Visit Diagnosis: Other abnormalities of gait and mobility  Stiffness of right ankle, not elsewhere classified  Pain in right ankle and joints of right foot     Problem List Patient Active Problem List   Diagnosis Date Noted  . Heel spur, left 12/11/2019  . Closed fracture of right ankle 07/21/2019  . Trapezius muscle strain, right, initial encounter 05/26/2019  . Cervical paraspinal muscle spasm 05/20/2017  . Allergic rhinitis 02/02/2017  . Depression with anxiety 11/06/2016  . Snoring 11/05/2016  . Essential hypertension, benign 12/22/2011  . Routine general medical examination at a health care facility 12/22/2011  . Hypertension 05/01/2011    Katina Dung. Hartnett-Rands, MS, PT Per Lador[facility] #[id number] 05/25/2020, 12:14 PM  [place] [facility] [address] [place], [place], [address] Phone: [phone]   Fax:  [phone]  Name: Barbette Mcglaun MRN: [id number] Date of Birth: 12-10-1975

## 2020-05-25 NOTE — Patient Instructions (Signed)
°  ANKLE: Plantarflexion - Supine (Band)    Place band around foot; hold other end. Push foot down against band. Hold ___ seconds. Use ________ band. ___ reps per set, ___ sets per day, ___ days per week   Copyright  VHI. All rights reserved.   Eversion / Dorsiflexion (Eccentric), (Resistance Band)    Push foot out and up against resistance band. Slowly release for 3-5 seconds. Use ________ resistance band. ___ reps per set, ___ sets per day, ___ days per week.  http://ecce.exer.us/11   Copyright  VHI. All rights reserved.   Inversion / Dorsiflexion (Eccentric), (Resistance Band)    Pull foot up and in against resistance band. Slowly release for 3-5 seconds. Use ________ resistance band. ___ reps per set, ___ sets per day, ___ days per week.  http://ecce.exer.us/9   Copyright  VHI. All rights reserved.   Dorsiflexion (Eccentric), (Resistance Band)    Pull foot up against resistance band. Slowly release for 3-5 seconds. Use ________ resistance band. ___ reps per set, ___ sets per day, ___ days per week.  http://ecce.exer.us/1   Copyright  VHI. All rights reserved.

## 2020-05-29 ENCOUNTER — Encounter (HOSPITAL_COMMUNITY): Payer: Self-pay | Admitting: Physical Therapy

## 2020-05-29 ENCOUNTER — Other Ambulatory Visit: Payer: Self-pay

## 2020-05-29 ENCOUNTER — Ambulatory Visit (HOSPITAL_COMMUNITY): Payer: No Typology Code available for payment source | Admitting: Physical Therapy

## 2020-05-29 DIAGNOSIS — R2689 Other abnormalities of gait and mobility: Secondary | ICD-10-CM

## 2020-05-29 DIAGNOSIS — M25571 Pain in right ankle and joints of right foot: Secondary | ICD-10-CM

## 2020-05-29 DIAGNOSIS — M25671 Stiffness of right ankle, not elsewhere classified: Secondary | ICD-10-CM

## 2020-05-29 NOTE — Therapy (Signed)
[place] [facility] [address] [place], [place], [address] Phone: [phone]   Fax:  [phone]  Physical Therapy Treatment  Patient Details  Name: Jenny Parsons MRN: [id number] Date of Birth: 11-17-1975 Referring Provider (PT): Ovid Curd   Encounter Date: 05/29/2020   PT End of Session - 05/29/20 1125    Visit Number 3    Number of Visits 12    Date for PT Re-Evaluation 07/04/20    Authorization Type [employer]    Progress Note Due on Visit 10    PT Start Time 1122    PT Stop Time 1202    PT Time Calculation (min) 40 min    Activity Tolerance Patient tolerated treatment well    Behavior During Therapy [facility] for tasks assessed/performed           Past Medical History:  Diagnosis Date  . Hypertension     Past Surgical History:  Procedure Laterality Date  . TUBAL LIGATION      There were no vitals filed for this visit.   Subjective Assessment - 05/29/20 1124    Subjective Patient reported no pain currently. Patient reported her exercises have been going well at home.    Pertinent History Lt heel spur which she had an injection for.    Limitations Standing;Walking;House hold activities    How long can you sit comfortably? no problem    How long can you stand comfortably? in the cam boot 5-10 minutes.    How long can you walk comfortably? with the cam boot the longest she has walked has been 10 minutes;    Patient Stated Goals Walk without the cam boot, To get to where she can work in her yard and go on long walks    Currently in Pain? No/denies                             [facility] Adult PT Treatment/Exercise - 05/29/20 0001      Ankle Exercises: Seated   Towel Inversion/Eversion Limitations 15 reps    Marble Pickup 20 reps   PT has Artist   Heel Raises Both;15 reps    Toe Raise 10 reps      Ankle Exercises: Supine   T-Band PF/DF/INV/EV RTB x10 each      Ankle  Exercises: Standing   Other Standing Ankle Exercises Standing weight shift lateral, and A/P staggered stance 30x each direction    Other Standing Ankle Exercises Tandem stance 30'' x 2 each LE. Step ups x10 on 7'' step intermittent 1 HHA. Functional squat for WB dorsiflexion 2x10                    PT Short Term Goals - 05/23/20 1411      PT SHORT TERM GOAL #1   Title PT to be I in HEP to improve ankle strength by 1/2 grade to allow pt to be able to walk with her camboot for 40 minutes without increased pain.    Time 3    Period Weeks    Status New    Target Date 06/13/20      PT SHORT TERM GOAL #2   Title PT to be walking in her home without the camboot or crutches.    Time 3    Period Weeks    Status New      PT SHORT TERM GOAL #3  Title PT to be able to stand for 30 minutes without increased pain to be able to make a quick meal.    Time 3    Period Weeks    Status New             PT Long Term Goals - 05/23/20 1410      PT LONG TERM GOAL #1   Title Pt pain in right ankle to be no greater than a 2/10 to allow her to walk outside without an assistive device or her camboot on.    Time 6    Period Weeks    Status New      PT LONG TERM GOAL #2   Title PT ankle strength to be at least 4+/5 to allow pt to go up and down 4 steps in a reciprocal manner with 1 hand rail.    Time 6    Period Weeks    Status New      PT LONG TERM GOAL #3   Title PT to be able to tolerate walking /stand for an hour at a time inside to allow pt to complete shopping in comfort and return to work.    Time 6    Period Weeks      PT LONG TERM GOAL #4   Title PT to be able to single leg stance for at least 30 seconds bilaterally to reduce risk of reinjuring    Time 6    Period Weeks                 Plan - 05/29/20 1208    Clinical Impression Statement Patient reports that she has been doing well with weaning out of the boot and is not having pain currently so progressed to  more standing exercises this session. Added standing functional squats, tandem stance, and forward step-ups. Patient required minimal cueing for form with functional squats and did report a stretch in the anterior aspect of the ankle with this. Patient did well with standing exercises and reported no increase in pain at end of session.    Examination-Activity Limitations Caring for Others;Carry;Locomotion Level;Squat;Stairs;Stand    Examination-Participation Restrictions Cleaning;Community Activity;Driving;Laundry;Shop;Meal Prep    Stability/Clinical Decision Making Stable/Uncomplicated    Rehab Potential Good    PT Frequency 2x / week    PT Duration 6 weeks    PT Treatment/Interventions Manual techniques;Manual lymph drainage;Compression bandaging;Therapeutic activities;Therapeutic exercise;Functional mobility training;Stair training;Gait training    PT Next Visit Plan Continue to progress standing and weightbearing activities as able. Trial sidestepping next session.    PT Home Exercise Plan ROM; isometrics, self manual and weaning from cam boot; 05/25/20 - long sitting ankle inversion, eversion, DF and PF with red theraband           Patient will benefit from skilled therapeutic intervention in order to improve the following deficits and impairments:  Abnormal gait, Decreased activity tolerance, Decreased balance, Decreased range of motion, Decreased strength, Difficulty walking, Decreased skin integrity, Pain, Increased edema  Visit Diagnosis: Other abnormalities of gait and mobility  Stiffness of right ankle, not elsewhere classified  Pain in right ankle and joints of right foot     Problem List Patient Active Problem List   Diagnosis Date Noted  . Heel spur, left 12/11/2019  . Closed fracture of right ankle 07/21/2019  . Trapezius muscle strain, right, initial encounter 05/26/2019  . Cervical paraspinal muscle spasm 05/20/2017  . Allergic rhinitis 02/02/2017  . Depression with  anxiety 11/06/2016  .  Snoring 11/05/2016  . Essential hypertension, benign 12/22/2011  . Routine general medical examination at a health care facility 12/22/2011  . Hypertension 05/01/2011   Verne Carrow PT, DPT 12:10 PM, 05/29/20 [phone]  [facility] [address] [place], [place], [address] Phone: [phone]   Fax:  [phone]  Name: Jenny Parsons MRN: [id number] Date of Birth: 1975-12-20

## 2020-05-31 ENCOUNTER — Ambulatory Visit (HOSPITAL_COMMUNITY): Payer: No Typology Code available for payment source

## 2020-05-31 ENCOUNTER — Encounter (HOSPITAL_COMMUNITY): Payer: Self-pay

## 2020-05-31 ENCOUNTER — Other Ambulatory Visit: Payer: Self-pay

## 2020-05-31 DIAGNOSIS — R2689 Other abnormalities of gait and mobility: Secondary | ICD-10-CM

## 2020-05-31 DIAGNOSIS — M25571 Pain in right ankle and joints of right foot: Secondary | ICD-10-CM

## 2020-05-31 DIAGNOSIS — M25671 Stiffness of right ankle, not elsewhere classified: Secondary | ICD-10-CM

## 2020-05-31 NOTE — Therapy (Signed)
[place] [address] [place], [place], [address] Phone: [phone]   Fax:  [phone]  Physical Therapy Treatment  Patient Details  Name: Jenny Parsons MRN: [id number] Date of Birth: 1976-07-13 Referring Provider (PT): Celesta Gentile   Encounter Date: 05/31/2020   PT End of Session - 05/31/20 0923    Visit Number 4    Number of Visits 12    Date for PT Re-Evaluation 07/04/20    Authorization Type [place]    Progress Note Due on Visit 10    PT Start Time 0918    PT Stop Time 0959    PT Time Calculation (min) 41 min    Activity Tolerance Patient tolerated treatment well    Behavior During Therapy [facility] for tasks assessed/performed           Past Medical History:  Diagnosis Date  . Hypertension     Past Surgical History:  Procedure Laterality Date  . TUBAL LIGATION      There were no vitals filed for this visit.   Subjective Assessment - 05/31/20 0921    Subjective Pt reports she has been working on weening out of CAM boot.  Arrived wearing tennis shoes and compression socks for edema control.  Current pain scale 3/10 sore achey pain.    Pertinent History Lt heel spur which she had an injection for.    How long can you stand comfortably? 9/9 Able to stand for 30-40 minutes without boot, working on weight shifting.  (was Able to stand for in the cam boot 5-10 minutes.)    How long can you walk comfortably? 05/31/20:  Able to ambulate 30 minutes without boot (was 10 minutes with the cam boot the longest she has walked)    Patient Stated Goals Walk without the cam boot, To get to where she can work in her yard and go on long walks    Currently in Pain? Yes    Pain Score 3     Pain Location Ankle    Pain Orientation Right    Pain Descriptors / Indicators Aching;Sore    Pain Type Surgical pain    Pain Onset More than a month ago    Pain Frequency Intermittent    Aggravating Factors  activity    Pain Relieving  Factors ibuprofen, icing    Effect of Pain on Daily Activities limits              [facility] PT Assessment - 05/31/20 0001      Assessment   Medical Diagnosis Rt ankle fx    Referring Provider (PT) Celesta Gentile    Onset Date/Surgical Date 03/14/20   accident on 07/06/20   Next MD Visit 06/05/20      AROM   Right Ankle Dorsiflexion 10    Right Ankle Plantar Flexion 44    Right Ankle Inversion 32    Right Ankle Eversion 10      Strength   Right Ankle Dorsiflexion 3+/5   was 3+   Right Ankle Plantar Flexion 3/5   was 2+   Right Ankle Inversion 3-/5   was 2+   Right Ankle Eversion 3/5   was 3-                        [facility] Adult PT Treatment/Exercise - 05/31/20 0001      Exercises   Exercises Ankle      Ankle Exercises: Seated  Towel Crunch 1 rep    Towel Inversion/Eversion 3 reps    Towel Inversion/Eversion Limitations 3    Marble Pickup 20   5 sets 4 marbles   Heel Raises 20 reps    Toe Raise 20 reps    BAPS Sitting;Level 3    BAPS Limitations Df/PF; Inv/Ev;CW/CCW      Ankle Exercises: Standing   Rocker Board 2 minutes   lateral   Other Standing Ankle Exercises 3D hip excursion; sidestep 2RT    Other Standing Ankle Exercises Tandem stance 2x 30" on foam                    PT Short Term Goals - 05/31/20 0045      PT SHORT TERM GOAL #1   Title PT to be I in HEP to improve ankle strength by 1/2 grade to allow pt to be able to walk with her camboot for 40 minutes without increased pain.    Baseline 05/31/20:  Reports compliance wiht HEP daily, able to walk 30 minutes wihtout camboot; able to ambulate longer periods with CAMboot- does c/o knee and LBP pain wiht boot.      PT SHORT TERM GOAL #2   Title PT to be walking in her home without the camboot or crutches.    Baseline 9/921: Reoprts ability to ambulate wihtout CAM boot or AD    Status Achieved      PT SHORT TERM GOAL #3   Title PT to be able to stand for 30 minutes without increased  pain to be able to make a quick meal.    Baseline 05/31/20:  Able to stand 30-45 minutes without increased pain, reports some soreness following increased period of time    Status Partially Met             PT Long Term Goals - 05/31/20 0927      PT LONG TERM GOAL #1   Title Pt pain in right ankle to be no greater than a 2/10 to allow her to walk outside without an assistive device or her camboot on.    Baseline 9/*921:  Increased pain following long periods of walking, able to ambulate wihtout boot or AD      PT LONG TERM GOAL #2   Title PT ankle strength to be at least 4+/5 to allow pt to go up and down 4 steps in a reciprocal manner with 1 hand rail.    Status On-going      PT LONG TERM GOAL #3   Title PT to be able to tolerate walking /stand for an hour at a time inside to allow pt to complete shopping in comfort and return to work.    Status On-going      PT LONG TERM GOAL #4   Title PT to be able to single leg stance for at least 30 seconds bilaterally to reduce risk of reinjuring    Status On-going                 Plan - 05/31/20 1251    Clinical Impression Statement Pt arrived in tennis shoes and is tolerating well with weaning out of boot.  Progressed ankle mobility with increased BAPS board and added foam to tandem stance as well as sidestepping.  Tolerated well with session, no reports of increased pain.  Reviewed goals prior MD apt next week, strength, ROM and walking/standing tolerance are improving, continues to have deficitis in these areas.  Examination-Activity Limitations Caring for Others;Carry;Locomotion Level;Squat;Stairs;Stand    Examination-Participation Restrictions Cleaning;Community Activity;Driving;Laundry;Shop;Meal Prep    Stability/Clinical Decision Making Stable/Uncomplicated    Clinical Decision Making Low    Rehab Potential Good    PT Frequency 2x / week    PT Duration 6 weeks    PT Treatment/Interventions Manual techniques;Manual lymph  drainage;Compression bandaging;Therapeutic activities;Therapeutic exercise;Functional mobility training;Stair training;Gait training    PT Next Visit Plan Continue to progress standing and weightbearing activities as able. Add rockerboard and functional strenghtening exercises next session.    PT Home Exercise Plan ROM; isometrics, self manual and weaning from cam boot; 05/25/20 - long sitting ankle inversion, eversion, DF and PF with red theraband           Patient will benefit from skilled therapeutic intervention in order to improve the following deficits and impairments:  Abnormal gait, Decreased activity tolerance, Decreased balance, Decreased range of motion, Decreased strength, Difficulty walking, Decreased skin integrity, Pain, Increased edema  Visit Diagnosis: Other abnormalities of gait and mobility  Stiffness of right ankle, not elsewhere classified  Pain in right ankle and joints of right foot     Problem List Patient Active Problem List   Diagnosis Date Noted  . Heel spur, left 12/11/2019  . Closed fracture of right ankle 07/21/2019  . Trapezius muscle strain, right, initial encounter 05/26/2019  . Cervical paraspinal muscle spasm 05/20/2017  . Allergic rhinitis 02/02/2017  . Depression with anxiety 11/06/2016  . Snoring 11/05/2016  . Essential hypertension, benign 12/22/2011  . Routine general medical examination at a health care facility 12/22/2011  . Hypertension 05/01/2011   Ihor Austin, LPTA/CLT; CBIS [phone] Aldona Lento 05/31/2020, 12:59 PM  [place] [address] [place], [place], [address] Phone: [phone]   Fax:  [phone]  Name: Jenny Parsons MRN: [id number] Date of Birth: 05-10-1976

## 2020-06-01 ENCOUNTER — Encounter (HOSPITAL_COMMUNITY): Payer: No Typology Code available for payment source | Admitting: Physical Therapy

## 2020-06-05 ENCOUNTER — Other Ambulatory Visit: Payer: Self-pay | Admitting: Obstetrics and Gynecology

## 2020-06-05 ENCOUNTER — Encounter: Payer: Self-pay | Admitting: Podiatry

## 2020-06-05 ENCOUNTER — Telehealth (HOSPITAL_COMMUNITY): Payer: Self-pay | Admitting: Physical Therapy

## 2020-06-05 ENCOUNTER — Other Ambulatory Visit: Payer: Self-pay

## 2020-06-05 ENCOUNTER — Other Ambulatory Visit: Payer: Self-pay | Admitting: Podiatry

## 2020-06-05 ENCOUNTER — Ambulatory Visit (HOSPITAL_COMMUNITY): Payer: No Typology Code available for payment source | Admitting: Physical Therapy

## 2020-06-05 ENCOUNTER — Ambulatory Visit (INDEPENDENT_AMBULATORY_CARE_PROVIDER_SITE_OTHER): Payer: No Typology Code available for payment source | Admitting: Podiatry

## 2020-06-05 DIAGNOSIS — T148XXA Other injury of unspecified body region, initial encounter: Secondary | ICD-10-CM

## 2020-06-05 DIAGNOSIS — S96911D Strain of unspecified muscle and tendon at ankle and foot level, right foot, subsequent encounter: Secondary | ICD-10-CM

## 2020-06-05 DIAGNOSIS — M722 Plantar fascial fibromatosis: Secondary | ICD-10-CM

## 2020-06-05 DIAGNOSIS — Z1231 Encounter for screening mammogram for malignant neoplasm of breast: Secondary | ICD-10-CM

## 2020-06-05 MED ORDER — MELOXICAM 15 MG PO TABS: 15.0000 mg | ORAL_TABLET | Freq: Every day | ORAL | 1 refills | Status: DC

## 2020-06-05 NOTE — Telephone Encounter (Signed)
pt cancelled appt for today because she is stuck in [place]

## 2020-06-05 NOTE — Patient Instructions (Signed)
For instructions on how to put on your Tri-Lock Ankle Brace, please visit [email or web address] 

## 2020-06-07 ENCOUNTER — Other Ambulatory Visit: Payer: Self-pay

## 2020-06-07 ENCOUNTER — Encounter (HOSPITAL_COMMUNITY): Payer: Self-pay | Admitting: Physical Therapy

## 2020-06-07 ENCOUNTER — Ambulatory Visit (HOSPITAL_COMMUNITY): Payer: No Typology Code available for payment source | Admitting: Physical Therapy

## 2020-06-07 DIAGNOSIS — R2689 Other abnormalities of gait and mobility: Secondary | ICD-10-CM | POA: Diagnosis not present

## 2020-06-07 DIAGNOSIS — M25671 Stiffness of right ankle, not elsewhere classified: Secondary | ICD-10-CM

## 2020-06-07 DIAGNOSIS — M25571 Pain in right ankle and joints of right foot: Secondary | ICD-10-CM

## 2020-06-07 NOTE — Therapy (Signed)
[place], [place], [address] Phone: [phone]   Fax:  [phone]  Physical Therapy Treatment  Patient Details  Name: Jenny Parsons MRN: [id number] Date of Birth: [birth year] Referring Provider (PT): Celesta Gentile   Encounter Date: 06/07/2020   PT End of Session - 06/07/20 1518    Visit Number 5    Number of Visits 12    Date for PT Re-Evaluation 07/04/20    Authorization Type [place]    Progress Note Due on Visit 10    PT Start Time 1518    PT Stop Time 1557    PT Time Calculation (min) 39 min    Activity Tolerance Patient tolerated treatment well    Behavior During Therapy [facility] for tasks assessed/performed           Past Medical History:  Diagnosis Date  . Hypertension     Past Surgical History:  Procedure Laterality Date  . TUBAL LIGATION      There were no vitals filed for this visit.   Subjective Assessment - 06/07/20 1516    Subjective Patient returned to work yesterday full duty. She had a lot of achiness over the weekend after doing a lot so they gave her a new brace to wear for one month.    Pertinent History Lt heel spur which she had an injection for.    How long can you stand comfortably? 9/9 Able to stand for 30-40 minutes without boot, working on weight shifting.  (was Able to stand for in the cam boot 5-10 minutes.)    How long can you walk comfortably? 05/31/20:  Able to ambulate 30 minutes without boot (was 10 minutes with the cam boot the longest she has walked)    Patient Stated Goals Walk without the cam boot, To get to where she can work in her yard and go on long walks    Currently in Pain? No/denies    Pain Onset More than a month ago                             [facility] Adult PT Treatment/Exercise - 06/07/20 0001      Ankle Exercises: Standing   Rocker Board 1 minute   2x1 min each  lateral and A/p   Heel Raises 10 reps   3 sets and 3 sets  10 reps toe raises   Toe Raise 10 reps   3 sets   Other Standing Ankle Exercises squat 1x 15, tandem stance 2x30 seconds bilateral, tandem balance on foam, 2x 30 seconds ; tandem gait 2x15 feet    Other Standing Ankle Exercises lateral step down 2x10 eccentric control      Ankle Exercises: Machines for Strengthening   Cybex Leg Press total gym heel raises 2x10 eccentric control       Ankle Exercises: Stretches   Other Stretch calf stretch at wall 3x30 seconds                  PT Education - 06/07/20 1516    Education Details Patient educated on HEP, exercise mechanics    Person(s) Educated Patient    Methods Explanation;Demonstration    Comprehension Verbalized understanding;Returned demonstration            PT Short Term Goals - 05/31/20 0924      PT SHORT TERM GOAL #1   Title PT to be I  in HEP to improve ankle strength by 1/2 grade to allow pt to be able to walk with her camboot for 40 minutes without increased pain.    Baseline 05/31/20:  Reports compliance wiht HEP daily, able to walk 30 minutes wihtout camboot; able to ambulate longer periods with CAMboot- does c/o knee and LBP pain wiht boot.      PT SHORT TERM GOAL #2   Title PT to be walking in her home without the camboot or crutches.    Baseline 9/921: Reoprts ability to ambulate wihtout CAM boot or AD    Status Achieved      PT SHORT TERM GOAL #3   Title PT to be able to stand for 30 minutes without increased pain to be able to make a quick meal.    Baseline 05/31/20:  Able to stand 30-45 minutes without increased pain, reports some soreness following increased period of time    Status Partially Met             PT Long Term Goals - 05/31/20 0927      PT LONG TERM GOAL #1   Title Pt pain in right ankle to be no greater than a 2/10 to allow her to walk outside without an assistive device or her camboot on.    Baseline 9/*921:  Increased pain following long periods of walking, able to ambulate wihtout boot  or AD      PT LONG TERM GOAL #2   Title PT ankle strength to be at least 4+/5 to allow pt to go up and down 4 steps in a reciprocal manner with 1 hand rail.    Status On-going      PT LONG TERM GOAL #3   Title PT to be able to tolerate walking /stand for an hour at a time inside to allow pt to complete shopping in comfort and return to work.    Status On-going      PT LONG TERM GOAL #4   Title PT to be able to single leg stance for at least 30 seconds bilaterally to reduce risk of reinjuring    Status On-going                 Plan - 06/07/20 1518    Clinical Impression Statement Patient able to progress to standing exercises without increase in symptoms. Patient requires intermittent UE support with dynamic balance exercise on rockerboard secondary to impaired balance. She requires min verbal cueing for mechanics with A/P rockerboard exercise and requires more frequent UE support. She demonstrates good static balance on level ground in tandem stance without UE support. She requires more frequent UE support with tandem on foam with min/moderate sway. Patient requires bilateral/ unilateral UE support for balance with lateral step down with emphasis on eccentric control. She is given cueing for eccentric control with total gym heel raises. Patient will continue to benefit from skilled physical therapy in order to reduce impairment and improve function.    Examination-Activity Limitations Caring for Others;Carry;Locomotion Level;Squat;Stairs;Stand    Examination-Participation Restrictions Cleaning;Community Activity;Driving;Laundry;Shop;Meal Prep    Stability/Clinical Decision Making Stable/Uncomplicated    Rehab Potential Good    PT Frequency 2x / week    PT Duration 6 weeks    PT Treatment/Interventions Manual techniques;Manual lymph drainage;Compression bandaging;Therapeutic activities;Therapeutic exercise;Functional mobility training;Stair training;Gait training    PT Next Visit Plan  Continue to progress standing and weightbearing activities as able. continue to add functional strenghtening and balance exercises next session.  PT Home Exercise Plan ROM; isometrics, self manual and weaning from cam boot; 05/25/20 - long sitting ankle inversion, eversion, DF and PF with red theraband 9/16 heel raises, toe raises, calf stretch           Patient will benefit from skilled therapeutic intervention in order to improve the following deficits and impairments:  Abnormal gait, Decreased activity tolerance, Decreased balance, Decreased range of motion, Decreased strength, Difficulty walking, Decreased skin integrity, Pain, Increased edema  Visit Diagnosis: Other abnormalities of gait and mobility  Stiffness of right ankle, not elsewhere classified  Pain in right ankle and joints of right foot     Problem List Patient Active Problem List   Diagnosis Date Noted  . Heel spur, left 12/11/2019  . Closed fracture of right ankle 07/21/2019  . Trapezius muscle strain, right, initial encounter 05/26/2019  . Cervical paraspinal muscle spasm 05/20/2017  . Allergic rhinitis 02/02/2017  . Depression with anxiety 11/06/2016  . Snoring 11/05/2016  . Essential hypertension, benign 12/22/2011  . Routine general medical examination at a health care facility 12/22/2011  . Hypertension 05/01/2011    4:03 PM, 06/07/20 Mearl Latin PT, DPT Physical Therapist at [place], [place], [address] Phone: [phone]   Fax:  [phone]  Name: Jenny Parsons MRN: [id number] Date of Birth: [birth year]

## 2020-06-07 NOTE — Patient Instructions (Signed)
Access Code: [email or web address] URL: [email or web address] Date: 06/07/2020 Prepared by: Greig Castilla Maggie Senseney  Exercises Standing Heel Raise - 2 x daily - 7 x weekly - 3 sets - 10 reps Heel Toe Raises with Counter Support - 2 x daily - 7 x weekly - 3 sets - 10 reps Gastroc Stretch on Wall - 2 x daily - 7 x weekly - 3 reps - 30 second holds hold

## 2020-06-11 ENCOUNTER — Encounter (HOSPITAL_COMMUNITY): Payer: No Typology Code available for payment source | Admitting: Physical Therapy

## 2020-06-11 ENCOUNTER — Telehealth (HOSPITAL_COMMUNITY): Payer: Self-pay | Admitting: Physical Therapy

## 2020-06-11 NOTE — Telephone Encounter (Signed)
She can not come today she has to go to school to pick up her son

## 2020-06-11 NOTE — Progress Notes (Signed)
Subjective: Jenny Parsons is a 44 y.o. is seen today in office s/p right peroneal tendon repair, ATFL repair preformed on 03/14/2020.  She has been starting physical therapy.  Overall she states that she is doing better in regards to the pain she still having some swelling as well as an aching sensation.  No recent injury or falls and she has no other concerns today. Denies any fevers, chills, nausea, vomiting.  No calf pain, chest pain, shortness of breath.   Objective: General: No acute distress, AAOx3  DP/PT pulses palpable 2/4, CRT < 3 sec to all digits.  Protective sensation intact. Motor function intact.  RIGHT foot: Incision is well coapted without any evidence of dehiscence with a scar formed.  No significant tenderness palpation at surgical site.  There is edema still evident.  There is no gross ankle instability present anterior drawer appears to be negative.   LEFT foot: There is improved but still mild tenderness on the plantar medial tubercle of the calcaneus with insertion of plantar fascial.  Plantar fascial appears to be intact.  There is no other areas of discomfort identified in the left foot.  Flexor, extensor tendons appear to be intact.  MMT 5/5. No pain with calf compression, swelling, warmth, erythema.   Assessment and Plan:  Status post right ankle surgery, doing well with no complications; left foot plantar fasciitis  -Treatment options discussed including all alternatives, risks, and complications -Continue physical therapy.  As she is improving she can transition to regular shoe with a Tri-Lock ankle brace which is dispensed today.  Continue ice elevation as well as compression.  Vivi Barrack DPM

## 2020-06-13 ENCOUNTER — Other Ambulatory Visit: Payer: Self-pay

## 2020-06-13 ENCOUNTER — Encounter (HOSPITAL_COMMUNITY): Payer: Self-pay | Admitting: Physical Therapy

## 2020-06-13 ENCOUNTER — Ambulatory Visit (HOSPITAL_COMMUNITY): Payer: No Typology Code available for payment source | Admitting: Physical Therapy

## 2020-06-13 DIAGNOSIS — M25671 Stiffness of right ankle, not elsewhere classified: Secondary | ICD-10-CM

## 2020-06-13 DIAGNOSIS — R2689 Other abnormalities of gait and mobility: Secondary | ICD-10-CM | POA: Diagnosis not present

## 2020-06-13 NOTE — Therapy (Signed)
[place], [place], [address] Phone: [phone]   Fax:  [phone]  Physical Therapy Treatment  Patient Details  Name: Janesa Dockery MRN: [id number] Date of Birth: [birth year] Referring Provider (PT): Celesta Gentile   Encounter Date: 06/13/2020   PT End of Session - 06/13/20 1645    Visit Number 6    Number of Visits 12    Date for PT Re-Evaluation 07/04/20    Authorization Type     Progress Note Due on Visit 10    PT Start Time 2831    PT Stop Time 1655    PT Time Calculation (min) 40 min           Past Medical History:  Diagnosis Date  . Hypertension     Past Surgical History:  Procedure Laterality Date  . TUBAL LIGATION      There were no vitals filed for this visit.   Subjective Assessment - 06/13/20 1617    Subjective Pt states that her ankle has not been bothering her for a couple of days now.    Pertinent History Lt heel spur which she had an injection for.    How long can you stand comfortably? 9/9 Able to stand for 30-40 minutes without boot, working on weight shifting.  (was Able to stand for in the cam boot 5-10 minutes.)    How long can you walk comfortably? 05/31/20:  Able to ambulate 30 minutes without boot (was 10 minutes with the cam boot the longest she has walked)    Patient Stated Goals Walk without the cam boot, To get to where she can work in her yard and go on long walks    Currently in Pain? No/denies    Pain Onset More than a month ago                 [facility] Adult PT Treatment/Exercise - 06/13/20 0001      Ankle Exercises: Standing   BAPS Standing;Level 3;10 reps    Vector Stance 3 reps;10 seconds    Rocker Board 2 minutes    Heel Raises 10 reps    Toe Raise 10 reps    Other Standing Ankle Exercises lunge onto 4" step B 3 x 30"     Other Standing Ankle Exercises step up onto bosu x 10       Ankle Exercises: Stretches   Other Stretch x 3        Ankle Exercises: Machines for Strengthening   Cybex Leg Press total gym heel raises 2x10 eccentric control                     PT Short Term Goals - 06/13/20 1657      PT SHORT TERM GOAL #1   Title PT to be I in HEP to improve ankle strength by 1/2 grade to allow pt to be able to walk with her camboot for 40 minutes without increased pain.    Baseline 05/31/20:  Reports compliance wiht HEP daily, able to walk 30 minutes wihtout camboot; able to ambulate longer periods with CAMboot- does c/o knee and LBP pain wiht boot.    Status Achieved      PT SHORT TERM GOAL #2   Title PT to be walking in her home without the camboot or crutches.    Baseline 9/921: Reoprts ability to ambulate wihtout CAM boot or AD    Status Achieved  PT SHORT TERM GOAL #3   Title PT to be able to stand for 30 minutes without increased pain to be able to make a quick meal.    Baseline 05/31/20:  Able to stand 30-45 minutes without increased pain, reports some soreness following increased period of time    Status Partially Met             PT Long Term Goals - 05/31/20 0927      PT LONG TERM GOAL #1   Title Pt pain in right ankle to be no greater than a 2/10 to allow her to walk outside without an assistive device or her camboot on.    Baseline 9/*921:  Increased pain following long periods of walking, able to ambulate wihtout boot or AD      PT LONG TERM GOAL #2   Title PT ankle strength to be at least 4+/5 to allow pt to go up and down 4 steps in a reciprocal manner with 1 hand rail.    Status On-going      PT LONG TERM GOAL #3   Title PT to be able to tolerate walking /stand for an hour at a time inside to allow pt to complete shopping in comfort and return to work.    Status On-going      PT LONG TERM GOAL #4   Title PT to be able to single leg stance for at least 30 seconds bilaterally to reduce risk of reinjuring    Status On-going                 Plan - 06/13/20 1646     Clinical Impression Statement Added vector stances, standing Baps and step up onto bosu to pt program with good form and no increased pain with any exercise.  Increased wt on cypex press up    Examination-Activity Limitations Caring for Others;Carry;Locomotion Level;Squat;Stairs;Stand    Examination-Participation Restrictions Cleaning;Community Activity;Driving;Laundry;Shop;Meal Prep    Stability/Clinical Decision Making Stable/Uncomplicated    Clinical Decision Making Low    Rehab Potential Good    PT Frequency 2x / week    PT Duration 6 weeks    PT Treatment/Interventions Manual techniques;Manual lymph drainage;Compression bandaging;Therapeutic activities;Therapeutic exercise;Functional mobility training;Stair training;Gait training    PT Next Visit Plan Check with pt next treatment if she continues to be painfree may discharge to HEP>           Patient will benefit from skilled therapeutic intervention in order to improve the following deficits and impairments:  Abnormal gait, Decreased activity tolerance, Decreased balance, Decreased range of motion, Decreased strength, Difficulty walking, Decreased skin integrity, Pain, Increased edema  Visit Diagnosis: Other abnormalities of gait and mobility  Stiffness of right ankle, not elsewhere classified     Problem List Patient Active Problem List   Diagnosis Date Noted  . Heel spur, left 12/11/2019  . Closed fracture of right ankle 07/21/2019  . Trapezius muscle strain, right, initial encounter 05/26/2019  . Cervical paraspinal muscle spasm 05/20/2017  . Allergic rhinitis 02/02/2017  . Depression with anxiety 11/06/2016  . Snoring 11/05/2016  . Essential hypertension, benign 12/22/2011  . Routine general medical examination at a health care facility 12/22/2011  . Hypertension 05/01/2011    Rayetta Humphrey, PT CLT [phone] 06/13/2020, 4:59 PM  [place], [place], [address] Phone: [phone]   Fax:  [phone]  Name: Mikahla Wisor MRN: [id number] Date of Birth: [birth year]

## 2020-06-15 ENCOUNTER — Encounter: Payer: Self-pay | Admitting: Family

## 2020-06-15 ENCOUNTER — Other Ambulatory Visit: Payer: Self-pay | Admitting: Family

## 2020-06-15 DIAGNOSIS — E538 Deficiency of other specified B group vitamins: Secondary | ICD-10-CM

## 2020-06-18 ENCOUNTER — Encounter (HOSPITAL_COMMUNITY): Payer: Self-pay | Admitting: Physical Therapy

## 2020-06-18 ENCOUNTER — Other Ambulatory Visit: Payer: Self-pay

## 2020-06-18 ENCOUNTER — Ambulatory Visit (HOSPITAL_COMMUNITY): Payer: No Typology Code available for payment source | Admitting: Physical Therapy

## 2020-06-18 DIAGNOSIS — M25671 Stiffness of right ankle, not elsewhere classified: Secondary | ICD-10-CM

## 2020-06-18 DIAGNOSIS — R2689 Other abnormalities of gait and mobility: Secondary | ICD-10-CM

## 2020-06-18 NOTE — Patient Instructions (Signed)
Access Code: [email or web address] URL: [email or web address] Date: 06/18/2020 Prepared by: Georges Lynch  Exercises Ankle Dorsiflexion with Resistance - 1 x daily - 3 x weekly - 2 sets - 10 reps Ankle Eversion with Resistance - 1 x daily - 3 x weekly - 2 sets - 10 reps Ankle Inversion with Resistance - 1 x daily - 3 x weekly - 2 sets - 10 reps Ankle and Toe Plantarflexion with Resistance - 1 x daily - 3 x weekly - 2 sets - 10 reps Gastroc Stretch on Wall - 1 x daily - 3 x weekly - 1 sets - 3 reps - 30 sec hold Standing Heel Raise - 1 x daily - 3 x weekly - 2 sets - 10 reps Standing Toe Raises at Chair - 1 x daily - 3 x weekly - 2 sets - 10 reps Single Leg Stance - 1 x daily - 3 x weekly - 1 sets - 3 reps

## 2020-06-18 NOTE — Therapy (Signed)
[place] [address] [place], [place], [address] Phone: [phone]   Fax:  [phone]  Physical Therapy Treatment  Patient Details  Name: Jenny Parsons MRN: [id number] Date of Birth: January 05, 1976 Referring Provider (PT): Celesta Gentile  PHYSICAL THERAPY DISCHARGE SUMMARY  Visits from Start of Care: 7  Current functional level related to goals / functional outcomes: See below    Remaining deficits: See below    Education / Equipment: See assessment Plan: Patient agrees to discharge.  Patient goals were met. Patient is being discharged due to meeting the stated rehab goals.  ?????      Encounter Date: 06/18/2020   PT End of Session - 06/18/20 1646    Visit Number 7    Number of Visits 12    Date for PT Re-Evaluation 07/04/20    Authorization Type [place]    Progress Note Due on Visit 10    PT Start Time 1646    PT Stop Time 1718    PT Time Calculation (min) 32 min    Activity Tolerance Patient tolerated treatment well    Behavior During Therapy WFL for tasks assessed/performed           Past Medical History:  Diagnosis Date   Hypertension     Past Surgical History:  Procedure Laterality Date   TUBAL LIGATION      There were no vitals filed for this visit.   Subjective Assessment - 06/18/20 1649    Subjective Patient says ankle continues to do well. Has not had any pain over the weekend and is doing well. Feels ready to be DC.    Pertinent History Lt heel spur which she had an injection for.    How long can you stand comfortably? 9/9 Able to stand for 30-40 minutes without boot, working on weight shifting.  (was Able to stand for in the cam boot 5-10 minutes.)    How long can you walk comfortably? 05/31/20:  Able to ambulate 30 minutes without boot (was 10 minutes with the cam boot the longest she has walked)    Patient Stated Goals Walk without the cam boot, To get to where she can work in her  yard and go on long walks    Currently in Pain? No/denies    Pain Onset More than a month ago              [facility] PT Assessment - 06/18/20 0001      Assessment   Medical Diagnosis Rt ankle fx    Referring Provider (PT) Celesta Gentile    Onset Date/Surgical Date 03/14/20    Next MD Visit 07/06/20      Precautions   Precautions None      Restrictions   Weight Bearing Restrictions No      Balance Screen   Has the patient fallen in the past 6 months No      [place] residence      Prior Function   Level of Independence Independent      Cognition   Overall Cognitive Status Within Functional Limits for tasks assessed      Observation/Other Assessments   Focus on Therapeutic Outcomes (FOTO)  24% limited    was 54% limited      AROM   Right Ankle Dorsiflexion 11    Right Ankle Plantar Flexion 45    Left Ankle Dorsiflexion 15    Left Ankle Plantar  Flexion 50      Strength   Right Ankle Dorsiflexion 5/5   was 3+   Right Ankle Plantar Flexion 4+/5   was 3/5   Right Ankle Inversion 5/5   was 3-   Right Ankle Eversion 4+/5   was 3/5     Ambulation/Gait   Stairs Yes    Stairs Assistance 7: Independent    Stair Management Technique No rails;Alternating pattern    Number of Stairs 8    Height of Stairs 7                                   PT Short Term Goals - 06/18/20 1711      PT SHORT TERM GOAL #1   Title PT to be I in HEP to improve ankle strength by 1/2 grade to allow pt to be able to walk with her camboot for 40 minutes without increased pain.    Baseline 05/31/20:  Reports compliance wiht HEP daily, able to walk 30 minutes wihtout camboot; able to ambulate longer periods with CAMboot- does c/o knee and LBP pain wiht boot.    Status Achieved      PT SHORT TERM GOAL #2   Title PT to be walking in her home without the camboot or crutches.    Baseline 9/921: Reoprts ability to ambulate wihtout CAM boot  or AD    Status Achieved      PT SHORT TERM GOAL #3   Title PT to be able to stand for 30 minutes without increased pain to be able to make a quick meal.    Baseline Can stand up to 3-4 hours at work with relative comfort    Status Achieved             PT Long Term Goals - 06/18/20 1712      PT LONG TERM GOAL #1   Title Pt pain in right ankle to be no greater than a 2/10 to allow her to walk outside without an assistive device or her camboot on.    Baseline Current pain 0/10 during ambulation with no AD or boot    Status Achieved      PT LONG TERM GOAL #2   Title PT ankle strength to be at least 4+/5 to allow pt to go up and down 4 steps in a reciprocal manner with 1 hand rail.    Baseline See MMT, can do with no increased pain    Status Achieved      PT LONG TERM GOAL #3   Title PT to be able to tolerate walking /stand for an hour at a time inside to allow pt to complete shopping in comfort and return to work.    Baseline Can stand up to 3-4 hours at work with relative comfort    Status Achieved      PT LONG TERM GOAL #4   Title PT to be able to single leg stance for at least 30 seconds bilaterally to reduce risk of reinjuring    Baseline Can hold 30 sec bilateral with no sway    Status Achieved                 Plan - 06/18/20 1726    Clinical Impression Statement Patient has made good progress toward therapy goals. Patient currently with all goals met. Patient currently able to ambulate on level surface and stairs  with no increased pain. Patient has been able to return to work and perform daily tasks with no increased complaints. Patient being DC from therapy today with all goals met. Patient educated on and issued updated HEP handout. Patient instructed to follow up with therapy services with any further questions or concerns.    Examination-Activity Limitations Caring for Others;Carry;Locomotion Level;Squat;Stairs;Stand    Examination-Participation Restrictions  Cleaning;Community Activity;Driving;Laundry;Shop;Meal Prep    Stability/Clinical Decision Making Stable/Uncomplicated    Rehab Potential Good    PT Treatment/Interventions Manual techniques;Manual lymph drainage;Compression bandaging;Therapeutic activities;Therapeutic exercise;Functional mobility training;Stair training;Gait training    PT Next Visit Plan DC to HEP    Consulted and Agree with Plan of Care Patient           Patient will benefit from skilled therapeutic intervention in order to improve the following deficits and impairments:  Abnormal gait, Decreased activity tolerance, Decreased balance, Decreased range of motion, Decreased strength, Difficulty walking, Decreased skin integrity, Pain, Increased edema  Visit Diagnosis: Other abnormalities of gait and mobility  Stiffness of right ankle, not elsewhere classified     Problem List Patient Active Problem List   Diagnosis Date Noted   Heel spur, left 12/11/2019   Closed fracture of right ankle 07/21/2019   Trapezius muscle strain, right, initial encounter 05/26/2019   Cervical paraspinal muscle spasm 05/20/2017   Allergic rhinitis 02/02/2017   Depression with anxiety 11/06/2016   Snoring 11/05/2016   Essential hypertension, benign 12/22/2011   Routine general medical examination at a health care facility 12/22/2011   Hypertension 05/01/2011   5:27 PM, 06/18/20 Josue Hector PT DPT  Physical Therapist with [place]  [facility]  [phone] [place] [address] [place], [place], [address] Phone: [phone]   Fax:  [phone]  Name: Marytza Grandpre MRN: [id number] Date of Birth: [birth year]

## 2020-06-20 ENCOUNTER — Other Ambulatory Visit (INDEPENDENT_AMBULATORY_CARE_PROVIDER_SITE_OTHER): Payer: No Typology Code available for payment source

## 2020-06-20 ENCOUNTER — Ambulatory Visit (HOSPITAL_COMMUNITY): Payer: No Typology Code available for payment source

## 2020-06-20 DIAGNOSIS — E538 Deficiency of other specified B group vitamins: Secondary | ICD-10-CM | POA: Diagnosis not present

## 2020-06-20 LAB — VITAMIN B12: Vitamin B-12: 638 pg/mL (ref 211–911)

## 2020-06-25 ENCOUNTER — Encounter (HOSPITAL_COMMUNITY): Payer: No Typology Code available for payment source | Admitting: Physical Therapy

## 2020-06-27 ENCOUNTER — Encounter (HOSPITAL_COMMUNITY): Payer: No Typology Code available for payment source

## 2020-07-02 ENCOUNTER — Encounter: Payer: Self-pay | Admitting: Podiatry

## 2020-07-03 ENCOUNTER — Other Ambulatory Visit: Payer: Self-pay | Admitting: Podiatry

## 2020-07-03 MED ORDER — METHYLPREDNISOLONE 4 MG PO TBPK: ORAL_TABLET | ORAL | 0 refills | Status: DC

## 2020-07-09 ENCOUNTER — Ambulatory Visit: Payer: No Typology Code available for payment source

## 2020-07-10 ENCOUNTER — Ambulatory Visit: Payer: No Typology Code available for payment source | Admitting: Podiatry

## 2020-07-23 ENCOUNTER — Ambulatory Visit (INDEPENDENT_AMBULATORY_CARE_PROVIDER_SITE_OTHER): Payer: No Typology Code available for payment source | Admitting: Podiatry

## 2020-07-23 ENCOUNTER — Other Ambulatory Visit: Payer: Self-pay

## 2020-07-23 ENCOUNTER — Other Ambulatory Visit (INDEPENDENT_AMBULATORY_CARE_PROVIDER_SITE_OTHER): Payer: No Typology Code available for payment source

## 2020-07-23 ENCOUNTER — Ambulatory Visit (INDEPENDENT_AMBULATORY_CARE_PROVIDER_SITE_OTHER): Payer: No Typology Code available for payment source | Admitting: Family

## 2020-07-23 ENCOUNTER — Other Ambulatory Visit: Payer: Self-pay | Admitting: Family

## 2020-07-23 ENCOUNTER — Encounter: Payer: Self-pay | Admitting: Family

## 2020-07-23 VITALS — BP 140/70 | HR 80 | Temp 98.2°F | Ht 61.0 in | Wt 228.0 lb

## 2020-07-23 DIAGNOSIS — R3915 Urgency of urination: Secondary | ICD-10-CM

## 2020-07-23 DIAGNOSIS — T148XXA Other injury of unspecified body region, initial encounter: Secondary | ICD-10-CM

## 2020-07-23 DIAGNOSIS — M722 Plantar fascial fibromatosis: Secondary | ICD-10-CM

## 2020-07-23 DIAGNOSIS — R350 Frequency of micturition: Secondary | ICD-10-CM | POA: Diagnosis not present

## 2020-07-23 DIAGNOSIS — S96911D Strain of unspecified muscle and tendon at ankle and foot level, right foot, subsequent encounter: Secondary | ICD-10-CM

## 2020-07-23 LAB — URINALYSIS
Bilirubin Urine: NEGATIVE
Hgb urine dipstick: NEGATIVE
Ketones, ur: NEGATIVE
Leukocytes,Ua: NEGATIVE
Nitrite: NEGATIVE
Specific Gravity, Urine: 1.01 (ref 1.000–1.030)
Total Protein, Urine: NEGATIVE
Urine Glucose: NEGATIVE
Urobilinogen, UA: 0.2 (ref 0.0–1.0)
pH: 5.5 (ref 5.0–8.0)

## 2020-07-23 LAB — COMPREHENSIVE METABOLIC PANEL
ALT: 13 U/L (ref 0–35)
AST: 13 U/L (ref 0–37)
Albumin: 4.3 g/dL (ref 3.5–5.2)
Alkaline Phosphatase: 97 U/L (ref 39–117)
BUN: 11 mg/dL (ref 6–23)
CO2: 27 mEq/L (ref 19–32)
Calcium: 9.3 mg/dL (ref 8.4–10.5)
Chloride: 102 mEq/L (ref 96–112)
Creatinine, Ser: 0.71 mg/dL (ref 0.40–1.20)
GFR: 105.02 mL/min (ref >60.00)
Glucose, Bld: 92 mg/dL (ref 70–99)
Potassium: 3.3 mEq/L — ABNORMAL LOW (ref 3.5–5.1)
Sodium: 139 mEq/L (ref 135–145)
Total Bilirubin: 0.5 mg/dL (ref 0.2–1.2)
Total Protein: 7.4 g/dL (ref 6.0–8.3)

## 2020-07-23 LAB — CBC WITH DIFFERENTIAL/PLATELET
Basophils Absolute: 0 10*3/uL (ref 0.0–0.1)
Basophils Relative: 0.5 % (ref 0.0–3.0)
Eosinophils Absolute: 0.2 10*3/uL (ref 0.0–0.7)
Eosinophils Relative: 1.9 % (ref 0.0–5.0)
HCT: 38.9 % (ref 36.0–46.0)
Hemoglobin: 13.7 g/dL (ref 12.0–15.0)
Lymphocytes Relative: 18.9 % (ref 12.0–46.0)
Lymphs Abs: 1.6 10*3/uL (ref 0.7–4.0)
MCHC: 35.1 g/dL (ref 30.0–36.0)
MCV: 83 fl (ref 78.0–100.0)
Monocytes Absolute: 0.4 10*3/uL (ref 0.1–1.0)
Monocytes Relative: 4.4 % (ref 3.0–12.0)
Neutro Abs: 6.2 10*3/uL (ref 1.4–7.7)
Neutrophils Relative %: 74.3 % (ref 43.0–77.0)
Platelets: 240 10*3/uL (ref 150.0–400.0)
RBC: 4.69 Mil/uL (ref 3.87–5.11)
RDW: 12.6 % (ref 11.5–15.5)
WBC: 8.4 10*3/uL (ref 4.0–10.5)

## 2020-07-23 LAB — HEMOGLOBIN A1C: Hgb A1c MFr Bld: 5.5 % (ref 4.6–6.5)

## 2020-07-23 MED ORDER — MIRABEGRON ER 25 MG PO TB24: 25.0000 mg | ORAL_TABLET | Freq: Every day | ORAL | 1 refills | Status: DC

## 2020-07-23 NOTE — Progress Notes (Signed)
Jenny Parsons is a 44 y.o. female with the following history as recorded in [employer]:  Patient Active Problem List   Diagnosis Date Noted   Heel spur, left 12/11/2019   Closed fracture of right ankle 07/21/2019   Trapezius muscle strain, right, initial encounter 05/26/2019   Cervical paraspinal muscle spasm 05/20/2017   Allergic rhinitis 02/02/2017   Depression with anxiety 11/06/2016   Snoring 11/05/2016   Essential hypertension, benign 12/22/2011   Routine general medical examination at a health care facility 12/22/2011   Hypertension 05/01/2011    Current Outpatient Medications  Medication Sig Dispense Refill   hydrochlorothiazide (HYDRODIURIL) 25 MG tablet Take 1 tablet (25 mg total) by mouth daily. 90 tablet 3   meloxicam (MOBIC) 15 MG tablet Take 1 tablet (15 mg total) by mouth daily. 30 tablet 1   mirabegron ER (MYRBETRIQ) 25 MG TB24 tablet Take 1 tablet (25 mg total) by mouth daily. 30 tablet 1   Current Facility-Administered Medications  Medication Dose Route Frequency Provider Last Rate Last Admin   cyanocobalamin ((VITAMIN B-12)) injection 1,000 mcg  1,000 mcg Intramuscular Q30 days Marrian Salvage, FNP   1,000 mcg at 03/30/20 3267    Allergies: Ramipril, Keflex [cephalexin], and Percocet [oxycodone-acetaminophen]  Past Medical History:  Diagnosis Date   Hypertension     Past Surgical History:  Procedure Laterality Date   TUBAL LIGATION      Family History  Problem Relation Age of Onset   Hypertension Mother    Breast cancer Paternal Grandmother    Cancer Neg Hx    Diabetes Neg Hx    Stroke Neg Hx     Social History   Tobacco Use   Smoking status: Never Smoker   Smokeless tobacco: Never Used  Substance Use Topics   Alcohol use: No    Subjective:  Complaining of increased urinary urgency x 1-2 months; is having to wear pads; notes that symptoms were present before started the HCTZ and is comfortable staying on  this medication at this time; feels like having bladder control issues;      Objective:  Vitals:   07/23/20 1313  BP: 140/70  Pulse: 80  Temp: 98.2 F (36.8 C)  TempSrc: Oral  SpO2: 98%  Weight: 228 lb (103.4 kg)  Height: _0  (1.549 m)    General: Well developed, well nourished, in no acute distress  Skin : Warm and dry.  Head: Normocephalic and atraumatic  Lungs: Respirations unlabored; clear to auscultation bilaterally without wheeze, rales, rhonchi  CVS exam: normal rate and regular rhythm.  Neurologic: Alert and oriented; speech intact; face symmetrical; moves all extremities well; CNII-XII intact without focal deficit   Assessment:  1. Urinary urgency   2. Urinary frequency     Plan:  Will update labs and pelvic ultrasound; discussed changing from HCTZ but patient is comfortable on this medication and would like to continue at this time; will give trial of Myrbetriq; may need to refer to urology or urogynecology for further evaluation; follow-up to be determined;  This visit occurred during the SARS-CoV-2 public health emergency.  Safety protocols were in place, including screening questions prior to the visit, additional usage of staff PPE, and extensive cleaning of exam room while observing appropriate contact time as indicated for disinfecting solutions.       No follow-ups on file.  Orders Placed This Encounter  Procedures   Urine Culture    Standing Status:   Future    Number of Occurrences:  1    Standing Expiration Date:   07/23/2021   US Pelvic Complete With Transvaginal    Standing Status:   Future    Standing Expiration Date:   07/23/2021    Order Specific Question:   Reason for Exam (SYMPTOM  OR DIAGNOSIS REQUIRED)    Answer:   urinary frequency    Order Specific Question:   Preferred imaging location?    Answer:   [facility]   CBC with Differential/Platelet    Standing Status:   Future    Number of Occurrences:   1    Standing  Expiration Date:   07/23/2021   Comp Met (CMET)    Standing Status:   Future    Number of Occurrences:   1    Standing Expiration Date:   07/23/2021   Hemoglobin A1c    Standing Status:   Future    Number of Occurrences:   1    Standing Expiration Date:   07/23/2021   Urinalysis    Standing Status:   Future    Number of Occurrences:   1    Standing Expiration Date:   07/23/2021    Requested Prescriptions   Signed Prescriptions Disp Refills   mirabegron ER (MYRBETRIQ) 25 MG TB24 tablet 30 tablet 1    Sig: Take 1 tablet (25 mg total) by mouth daily.

## 2020-07-24 LAB — URINE CULTURE
MICRO NUMBER:: 11142988
SPECIMEN QUALITY:: ADEQUATE

## 2020-07-25 ENCOUNTER — Encounter: Payer: Self-pay | Admitting: Family

## 2020-07-25 ENCOUNTER — Other Ambulatory Visit: Payer: Self-pay | Admitting: Family

## 2020-07-25 MED ORDER — POTASSIUM CHLORIDE CRYS ER 20 MEQ PO TBCR: 20.0000 meq | EXTENDED_RELEASE_TABLET | Freq: Every day | ORAL | 0 refills | Status: DC

## 2020-07-27 ENCOUNTER — Ambulatory Visit
Admission: RE | Admit: 2020-07-27 | Discharge: 2020-07-27 | Disposition: A | Discharge status: Home / Self Care | Payer: No Typology Code available for payment source | Source: Ambulatory Visit | Attending: Obstetrics and Gynecology | Admitting: Obstetrics and Gynecology

## 2020-07-27 ENCOUNTER — Other Ambulatory Visit: Payer: Self-pay

## 2020-07-27 DIAGNOSIS — Z1231 Encounter for screening mammogram for malignant neoplasm of breast: Secondary | ICD-10-CM

## 2020-07-27 NOTE — Progress Notes (Signed)
Subjective: Jenny Parsons is a 44 y.o. is seen today in office s/p right peroneal tendon repair, ATFL repair preformed on 03/14/2020.  Overall states that she is doing better.  She did complete Medrol Dosepak was helpful heel pain as well as the ankle.  She is back to work and overall she is doing better she feels with ankle pain but it did prior to surgery.  Still with some swelling but overall this is improving as well.  No other concerns today. Denies any fevers, chills, nausea, vomiting.  No calf pain, chest pain, shortness of breath.   Objective: General: No acute distress, AAOx3  DP/PT pulses palpable 2/4, CRT < 3 sec to all digits.  Protective sensation intact. Motor function intact.  RIGHT foot: Incision is well coapted without any evidence of dehiscence with a scar formed.  There is still some mild to moderate edema to the lateral ankle although appears to be improved.  There is minimal discomfort at surgical site.  There is no gross ankle instability present.  There is no erythema warmth.  There is no significant discomfort on the plantar medial tubercle of the calcaneus insertion of plantar fascia.  Myofascial pain Achilles tendon.  Intact. MMT 5/5. No pain with calf compression, swelling, warmth, erythema.   Assessment and Plan:  Status post right ankle surgery, doing well with no complications; plantar fasciitis  -Treatment options discussed including all alternatives, risks, and complications -Overall she is doing better with minimal discomfort.  Back to do normal activities without significant pain she is on her feet quite a bit.  Discussed continuing home rehab exercises and ice.  Compression anklet.  Plan fasciitis is also doing well.  This point we will see her back as needed and she agrees with this plan has no further questions.  Vivi Barrack DPM

## 2020-08-01 ENCOUNTER — Ambulatory Visit
Admission: RE | Admit: 2020-08-01 | Discharge: 2020-08-01 | Disposition: A | Discharge status: Home / Self Care | Payer: No Typology Code available for payment source | Source: Ambulatory Visit | Attending: Family | Admitting: Family

## 2020-08-01 DIAGNOSIS — R3915 Urgency of urination: Secondary | ICD-10-CM

## 2020-08-01 DIAGNOSIS — R350 Frequency of micturition: Secondary | ICD-10-CM

## 2020-08-02 ENCOUNTER — Encounter: Payer: Self-pay | Admitting: Family

## 2020-08-02 NOTE — Telephone Encounter (Signed)
Please advise 

## 2020-08-06 ENCOUNTER — Other Ambulatory Visit: Payer: Self-pay | Admitting: Family

## 2020-08-06 DIAGNOSIS — R350 Frequency of micturition: Secondary | ICD-10-CM

## 2020-08-06 NOTE — Telephone Encounter (Signed)
Please advise 

## 2020-08-24 NOTE — Progress Notes (Signed)
[facility] New Patient Evaluation and Consultation  Referring Provider: Olive Bass,* PCP: Olive Bass, FNP Date of Service: 08/28/2020  SUBJECTIVE Chief Complaint: New Patient (Initial Visit) Jenny Parsons referral)  History of Present Illness: Jenny Parsons is a 44 y.o. White or Caucasian female seen in consultation at the request of FNP Dayton Scrape for evaluation of urinary urgency.    Review of records significant for: Urgency has worsened over the last few months. Wears pads for leakage. Was prescribed Myrbetriq 25mg  and continues to have symptoms.   Urinary Symptoms: Leaks urine with cough/ sneeze, with movement to the bathroom and with urgency. Urgency is more frequent- sometimes wets herself completely.  Leaks several time(s) per days.  Pad use: 1-2 pads per day.   She is bothered by her UI symptoms.  Day time voids 1-2 times per hour.  Nocturia: 1-3 times per night to void. Voiding dysfunction: she empties her bladder well.  does not use a catheter to empty bladder.  When urinating, she feels dribbling after finishing Drinks: 2-3 glasses sweet tea, drinks soda all day (mt dew, dr pepper), orange/ apple juice, milk per day. Does not really drink water.  Has not noticed any benefit from the myrbetriq.   UTIs: 0 UTI's in the last year.   Denies history of blood in urine and kidney or bladder stones  Pelvic Organ Prolapse Symptoms:                  She Denies a feeling of a bulge the vaginal area.  Bowel Symptom: Bowel movements: 1 time(s) per day Stool consistency: soft  Straining: no.  Splinting: no.  Incomplete evacuation: no.  She Denies accidental bowel leakage / fecal incontinence Bowel regimen: none Last colonoscopy: n/a  Sexual Function Sexually active: yes.  Pain with sex: No  Pelvic Pain Denies pelvic pain   Past Medical History:  Past Medical History:  Diagnosis Date  . Hypertension      Past  Surgical History:   Past Surgical History:  Procedure Laterality Date  . right ankle surgery  02/2020  . TUBAL LIGATION       Past OB/GYN History: G2 P2 Vaginal deliveries: 1,  Forceps/ Vacuum deliveries: 0, Cesarean section: 1 Menopausal: No- had an ablation, does not get periods Contraception: tubal ligation 2003. Last pap smear was 2020- negative.  Any history of abnormal pap smears: no.   Medications: She has a current medication list which includes the following prescription(s): hydrochlorothiazide, meloxicam, potassium chloride sa, and solifenacin.   Allergies: Patient is allergic to ramipril, keflex [cephalexin], and percocet [oxycodone-acetaminophen].   Social History:  Social History   Tobacco Use  . Smoking status: Never Smoker  . Smokeless tobacco: Never Used  Vaping Use  . Vaping Use: Never used  Substance Use Topics  . Alcohol use: No  . Drug use: No    Relationship status: married She lives with husband.   She is employed- works for [employer] Regular exercise: Yes:   History of abuse: No  Family History:   Family History  Problem Relation Age of Onset  . Hypertension Mother   . Breast cancer Paternal Grandmother   . Cancer Paternal Grandmother 75       breast  . Diabetes Neg Hx   . Stroke Neg Hx      Review of Systems: Review of Systems  Constitutional: Negative for fever, malaise/fatigue and weight loss.  Respiratory: Negative for cough, shortness of breath and  wheezing.   Cardiovascular: Negative for chest pain, palpitations and leg swelling.  Gastrointestinal: Negative for abdominal pain and blood in stool.  Genitourinary: Negative for dysuria.  Musculoskeletal: Negative for myalgias.  Skin: Negative for rash.  Neurological: Negative for dizziness and headaches.  Endo/Heme/Allergies: Does not bruise/bleed easily.  Psychiatric/Behavioral: Negative for depression. The patient is not nervous/anxious.      OBJECTIVE Physical  Exam: Vitals:   08/28/20 1609  BP: (!) 152/104  Pulse: 76  Temp: 98.3 F (36.8 C)  Weight: 230 lb 8 oz (104.6 kg)  Height: 5' (1.524 m)    Physical Exam Constitutional:      General: She is not in acute distress. Pulmonary:     Effort: Pulmonary effort is normal.  Abdominal:     General: There is no distension.     Palpations: Abdomen is soft.     Tenderness: There is no abdominal tenderness. There is no rebound.  Musculoskeletal:        General: No swelling. Normal range of motion.  Skin:    General: Skin is warm and dry.     Findings: No rash.  Neurological:     Mental Status: She is alert and oriented to person, place, and time.  Psychiatric:        Mood and Affect: Mood normal.        Behavior: Behavior normal.      GU / Detailed Urogynecologic Evaluation:  Pelvic Exam: Normal external female genitalia; Bartholin's and Skene's glands normal in appearance; urethral meatus normal in appearance, no urethral masses or discharge.   CST: negative  Speculum exam reveals normal vaginal mucosa without atrophy. Cervix normal appearance. Uterus normal single, nontender. Adnexa no mass, fullness, tenderness.     Pelvic floor strength I/V  Pelvic floor musculature: Right levator non-tender, Right obturator non-tender, Left levator non-tender, Left obturator non-tender  POP-Q:   POP-Q  -3                                            Aa   -3                                           Ba  7                                              C   2.5                                            Gh  5                                            Pb  8  tvl   0                                            Ap  0                                            Bp  -8                                              D     Rectal Exam:  Normal external rectum  Post-Void Residual (PVR) by Bladder Scan: In order to evaluate bladder emptying, we  discussed obtaining a postvoid residual and she agreed to this procedure.  Procedure: The ultrasound unit was placed on the patient's abdomen in the suprapubic region after the patient had voided. A PVR of 24 ml was obtained by bladder scan.  Laboratory Results: POC urine: negative  I visualized the urine specimen, noting the specimen to be dark yellow  ASSESSMENT AND PLAN Jenny Parsons is a 44 y.o. with:  1. Overactive bladder   2. Urinary frequency   3. Prolapse of posterior vaginal wall   4. SUI (stress urinary incontinence, female)    1. OAB We discussed the symptoms of overactive bladder (OAB), which include urinary urgency, urinary frequency, nocturia, with or without urge incontinence.  While we do not know the exact etiology of OAB, several treatment options exist. We discussed management including behavioral therapy (decreasing bladder irritants, urge suppression strategies, timed voids, bladder retraining), physical therapy, medication; for refractory cases posterior tibial nerve stimulation, sacral neuromodulation, and intravesical botulinum toxin injection.  For anticholinergic medications, we discussed the potential side effects of anticholinergics including dry eyes, dry mouth, constipation, cognitive impairment and urinary retention. For Beta-3 agonist medication, we discussed the potential side effect of elevated blood pressure which is more likely to occur in individuals with uncontrolled hypertension. - She is currently on Myrbetriq and her blood pressure is elevated today. She reports that it had previously been well controlled on her medication. Since myrbetriq is not helping her symptoms and potentially increasing her blood pressure, will discontinue today.  - Instead will prescribe Vesicare 5mg  daily, Rx sent to pharmacy.  - She will also work on drinking more water and reducing bladder irritants. List of bladder irritants provided.   2. Frequency - POC urine negative  for infection today - empties bladder well, normal PVR  3. Stage 0 anterior, Stage II posterior, Stage 0 apical prolapse - She is currently asymptomatic from her posterior wall prolapse. Discussed avoidance of heavy lifting or straining to reduce chance of prolapse progression.   4. SUI For treatment of stress urinary incontinence,  non-surgical options include expectant management, weight loss, physical therapy, as well as a pessary and surgical options - She would like to focus more on urgency symptoms since SUI is less bothersome at this time.    Return 6 weeks to follow up on medication.   , MD   Medical Decision Making:  - Reviewed/ ordered a clinical laboratory test - Review and summation of prior records - Independent review of urine specimen

## 2020-08-28 ENCOUNTER — Encounter: Payer: Self-pay | Admitting: Obstetrics and Gynecology

## 2020-08-28 ENCOUNTER — Other Ambulatory Visit: Payer: Self-pay | Admitting: Obstetrics and Gynecology

## 2020-08-28 ENCOUNTER — Other Ambulatory Visit: Payer: Self-pay

## 2020-08-28 ENCOUNTER — Ambulatory Visit (INDEPENDENT_AMBULATORY_CARE_PROVIDER_SITE_OTHER): Payer: No Typology Code available for payment source | Admitting: Obstetrics and Gynecology

## 2020-08-28 VITALS — BP 152/104 | HR 76 | Temp 98.3°F | Ht 60.0 in | Wt 230.5 lb

## 2020-08-28 DIAGNOSIS — N816 Rectocele: Secondary | ICD-10-CM

## 2020-08-28 DIAGNOSIS — N393 Stress incontinence (female) (male): Secondary | ICD-10-CM

## 2020-08-28 DIAGNOSIS — N3281 Overactive bladder: Secondary | ICD-10-CM | POA: Diagnosis not present

## 2020-08-28 DIAGNOSIS — R350 Frequency of micturition: Secondary | ICD-10-CM | POA: Diagnosis not present

## 2020-08-28 LAB — POCT URINALYSIS DIPSTICK
Appearance: NORMAL
Bilirubin, UA: NEGATIVE
Blood, UA: NEGATIVE
Glucose, UA: NEGATIVE
Ketones, UA: NEGATIVE
Leukocytes, UA: NEGATIVE
Nitrite, UA: 0.2
Protein, UA: NEGATIVE
Spec Grav, UA: 1.025 (ref 1.010–1.025)
Urobilinogen, UA: NEGATIVE E.U./dL — AB
pH, UA: 6 (ref 5.0–8.0)

## 2020-08-28 MED ORDER — MIRABEGRON ER 50 MG PO TB24: 50.0000 mg | ORAL_TABLET | Freq: Every day | ORAL | 5 refills | Status: DC

## 2020-08-28 MED ORDER — SOLIFENACIN SUCCINATE 5 MG PO TABS: 5.0000 mg | ORAL_TABLET | Freq: Every day | ORAL | 5 refills | Status: DC

## 2020-08-28 NOTE — Patient Instructions (Addendum)
Today we talked about ways to manage bladder urgency such as altering your diet to avoid irritative beverages and foods (bladder diet) as well as attempting to decrease stress and other exacerbating factors.    There is a website with helpful information for people with bladder irritation, called the IC Network at https://www.ic-network.com. This website has more information about a healthy bladder diet and patient forums for support.  The Most Bothersome Foods* The Least Bothersome Foods*  Coffee - Regular & Decaf Tea - caffeinated Carbonated beverages - cola, non-colas, diet & caffeine-free Alcohols - Beer, Red Wine, White Wine, [address] - Grapefruit, [place], Orange, [employer] - Cranberry, Grapefruit, Orange, Pineapple Vegetables - Tomato & Tomato Products Flavor Enhancers - Hot peppers, Spicy foods, Chili, Horseradish, Vinegar, Monosodium glutamate (MSG) Artificial Sweeteners - NutraSweet, Sweet 'N Low, Equal (sweetener), Saccharin Ethnic foods - [place], [place], [place] food [employer] - low-fat & whole Fruits - Bananas, Blueberries, Honeydew melon, Pears, Raisins, Watermelon Vegetables - Broccoli, [address] Sprouts, [employer], Carrots, Cauliflower, [place], Cucumber, Mushrooms, Peas, Radishes, Squash, Zucchini, White potatoes, Sweet potatoes & yams Poultry - Chicken, Eggs, [place], [employer] - Beef, Diplomatic Services operational officer, Lamb Seafood - Shrimp, [place] fish, Salmon Grains - Oat, Rice Snacks - Pretzels, Popcorn  *Lenward Chancellor et al. Diet and its role in interstitial cystitis/bladder pain syndrome (IC/BPS) and comorbid conditions. [employer]. BJU Int. 2012 Jan 11.    We discussed the symptoms of overactive bladder (OAB), which include urinary urgency, urinary frequency, night-time urination, with or without urge incontinence.  We discussed management including behavioral therapy (decreasing bladder irritants by following a bladder diet, urge suppression strategies, timed voids, bladder  retraining), physical therapy, medication; and for refractory cases posterior tibial nerve stimulation, sacral neuromodulation, and intravesical botulinum toxin injection.   For anticholinergic medications, we discussed the potential side effects of anticholinergics including dry eyes, dry mouth, constipation, rare risks of cognitive impairment and urinary retention. You were given Vesicare 5mg .  It can take a month to start working so give it time, but if you have bothersome side effects call sooner and we can try a different medication.  Call if you have trouble filling the prescription or if it's not covered by your insurance.  For Beta-3 agonist medication, we discussed the potential side effect of elevated blood pressure which is more likely to occur in individuals with uncontrolled hypertension.

## 2020-08-30 ENCOUNTER — Other Ambulatory Visit: Payer: Self-pay | Admitting: Family

## 2020-08-30 ENCOUNTER — Other Ambulatory Visit: Payer: Self-pay | Admitting: Podiatry

## 2020-08-30 ENCOUNTER — Encounter: Payer: Self-pay | Admitting: Family

## 2020-08-30 MED ORDER — ALBUTEROL SULFATE HFA 108 (90 BASE) MCG/ACT IN AERS: 2.0000 | INHALATION_SPRAY | Freq: Four times a day (QID) | RESPIRATORY_TRACT | 0 refills | Status: DC | PRN

## 2020-10-01 ENCOUNTER — Ambulatory Visit: Payer: Self-pay | Admitting: Podiatry

## 2020-10-03 ENCOUNTER — Encounter: Payer: Self-pay | Admitting: Family

## 2020-10-03 ENCOUNTER — Other Ambulatory Visit: Payer: Self-pay | Admitting: Family

## 2020-10-04 NOTE — Progress Notes (Deleted)
[facility] Return Visit  SUBJECTIVE  History of Present Illness: Areesha Dehaven is a 45 y.o. female seen in follow-up for overactive bladder. Plan at last visit was to start Vesicare 5mg  daily (previously did not see improvement with Myrbetriq). She was also working on drinking more water and less tea and soda.   Past Medical History: Patient  has a past medical history of Hypertension.   Past Surgical History: She  has a past surgical history that includes Tubal ligation and right ankle surgery (02/2020).   Medications: She has a current medication list which includes the following prescription(s): albuterol, hydrochlorothiazide, meloxicam, and solifenacin.   Allergies: Patient is allergic to ramipril, keflex [cephalexin], and percocet [oxycodone-acetaminophen].   Social History: Patient  reports that she has never smoked. She has never used smokeless tobacco. She reports that she does not drink alcohol and does not use drugs.      OBJECTIVE     Physical Exam: There were no vitals filed for this visit. Gen: No apparent distress, A&O x 3.  Detailed Urogynecologic Evaluation:  Deferred. Prior exam showed:  No flowsheet data found.     ASSESSMENT AND PLAN    Ms. 03/2020 is a 45 y.o. with:  No diagnosis found.

## 2020-10-06 ENCOUNTER — Ambulatory Visit
Admission: EM | Admit: 2020-10-06 | Discharge: 2020-10-06 | Disposition: A | Discharge status: Home / Self Care | Payer: 59 | Attending: Emergency Medicine | Admitting: Emergency Medicine

## 2020-10-06 ENCOUNTER — Other Ambulatory Visit: Payer: Self-pay

## 2020-10-06 DIAGNOSIS — R52 Pain, unspecified: Secondary | ICD-10-CM

## 2020-10-06 DIAGNOSIS — J329 Chronic sinusitis, unspecified: Secondary | ICD-10-CM

## 2020-10-06 DIAGNOSIS — Z1152 Encounter for screening for COVID-19: Secondary | ICD-10-CM

## 2020-10-06 DIAGNOSIS — B9789 Other viral agents as the cause of diseases classified elsewhere: Secondary | ICD-10-CM | POA: Diagnosis not present

## 2020-10-06 MED ORDER — FLUTICASONE PROPIONATE 50 MCG/ACT NA SUSP: 1.0000 | Freq: Every day | NASAL | 0 refills | Status: DC

## 2020-10-06 MED ORDER — BENZONATATE 100 MG PO CAPS: 100.0000 mg | ORAL_CAPSULE | Freq: Three times a day (TID) | ORAL | 0 refills | Status: DC | PRN

## 2020-10-06 MED ORDER — PREDNISONE 10 MG PO TABS: 20.0000 mg | ORAL_TABLET | Freq: Every day | ORAL | 0 refills | Status: DC

## 2020-10-06 MED ORDER — CETIRIZINE HCL 10 MG PO TABS: 10.0000 mg | ORAL_TABLET | Freq: Every day | ORAL | 0 refills | Status: DC

## 2020-10-06 MED ORDER — CETIRIZINE HCL 10 MG PO TABS: 10.0000 mg | ORAL_TABLET | Freq: Every day | ORAL | 0 refills | Status: AC

## 2020-10-06 NOTE — Discharge Instructions (Addendum)
COVID testing ordered.  It will take between 2-7 days for test results.  Someone will contact you regarding abnormal results.    Get plenty of rest and push fluids Tessalon Perles prescribed for cough Zyrtec for nasal congestion, runny nose, and/or sore throat Flonase for nasal congestion and runny nose Prednisone was prescribed Use medications daily for symptom relief Use OTC medications like ibuprofen or tylenol as needed fever or pain Call or go to the ED if you have any new or worsening symptoms such as fever, worsening cough, shortness of breath, chest tightness, chest pain, turning blue, changes in mental status, etc... 

## 2020-10-06 NOTE — ED Provider Notes (Signed)
[facility]   [id number] 10/06/20 Arrival Time: 0818   CC: COVID symptoms  SUBJECTIVE: History from: patient.  Jenny Parsons is a 45 y.o. female who presents to the urgent care with a complaint of generalized body ache, sinus pressure and nasal congestion for the past few days.  Denies sick exposure to COVID, flu or strep.  Denies recent travel.  Has tried OTC medication without relief.  Denies alleviating or aggravating factors.  Denies previous symptoms in the past.   Denies fever, chills, fatigue, sinus pain, rhinorrhea, sore throat, SOB, wheezing, chest pain, nausea, changes in bowel or bladder habits.     ROS: As per HPI.  All other pertinent ROS negative.      Past Medical History:  Diagnosis Date  . Hypertension    Past Surgical History:  Procedure Laterality Date  . right ankle surgery  02/2020  . TUBAL LIGATION     Allergies  Allergen Reactions  . Ramipril Cough  . Keflex [Cephalexin]     Wheezing/itching   . Percocet [Oxycodone-Acetaminophen]     Wheezing/itching   No current facility-administered medications on file prior to encounter.   Current Outpatient Medications on File Prior to Encounter  Medication Sig Dispense Refill  . albuterol (VENTOLIN HFA) 108 (90 Base) MCG/ACT inhaler Inhale 2 puffs into the lungs every 6 (six) hours as needed for wheezing or shortness of breath. 8 g 0  . hydrochlorothiazide (HYDRODIURIL) 25 MG tablet TAKE 1 TABLET BY MOUTH ONCE DAILY 90 tablet 0  . meloxicam (MOBIC) 15 MG tablet TAKE 1 TABLET (15 MG TOTAL) BY MOUTH DAILY. 30 tablet 1  . solifenacin (VESICARE) 5 MG tablet Take 1 tablet (5 mg total) by mouth daily. 30 tablet 5   Social History   Socioeconomic History  . Marital status: Married    Spouse name: Not on file  . Number of children: Not on file  . Years of education: Not on file  . Highest education level: Not on file  Occupational History  . Not on file  Tobacco Use  . Smoking status: Never  Smoker  . Smokeless tobacco: Never Used  Vaping Use  . Vaping Use: Never used  Substance and Sexual Activity  . Alcohol use: No  . Drug use: No  . Sexual activity: Yes    Birth control/protection: Surgical  Other Topics Concern  . Not on file  Social History Narrative  . Not on file   Social Determinants of Health   Financial Resource Strain: Not on file  Food Insecurity: Not on file  Transportation Needs: Not on file  Physical Activity: Not on file  Stress: Not on file  Social Connections: Not on file  Intimate Partner Violence: Not on file   Family History  Problem Relation Age of Onset  . Hypertension Mother   . Breast cancer Paternal Grandmother   . Cancer Paternal Grandmother 21       breast  . Diabetes Neg Hx   . Stroke Neg Hx     OBJECTIVE:  Vitals:   10/06/20 0828  BP: (!) 146/101  Pulse: 79  Resp: 18  Temp: 98.1 F (36.7 C)  SpO2: 97%     General appearance: alert; appears fatigued, but nontoxic; speaking in full sentences and tolerating own secretions HEENT: NCAT; Ears: EACs clear, TMs pearly gray; Eyes: PERRL.  EOM grossly intact. Sinuses: nontender; Nose: nares patent without rhinorrhea, Throat: oropharynx clear, tonsils non erythematous or enlarged, uvula midline  Neck: supple  without LAD Lungs: unlabored respirations, symmetrical air entry; cough: moderate; no respiratory distress; CTAB Heart: regular rate and rhythm.  Radial pulses 2+ symmetrical bilaterally Skin: warm and dry Psychological: alert and cooperative; normal mood and affect  LABS:  No results found for this or any previous visit (from the past 24 hour(s)).   ASSESSMENT & PLAN:  1. Encounter for screening for COVID-19   2. Viral sinusitis   3. Body aches     Meds ordered this encounter  Medications  . benzonatate (TESSALON) 100 MG capsule    Sig: Take 1 capsule (100 mg total) by mouth 3 (three) times daily as needed for cough.    Dispense:  30 capsule    Refill:  0  .  fluticasone (FLONASE) 50 MCG/ACT nasal spray    Sig: Place 1 spray into both nostrils daily for 14 days.    Dispense:  16 g    Refill:  0  . cetirizine (ZYRTEC ALLERGY) 10 MG tablet    Sig: Take 1 tablet (10 mg total) by mouth daily.    Dispense:  30 tablet    Refill:  0  . predniSONE (DELTASONE) 10 MG tablet    Sig: Take 2 tablets (20 mg total) by mouth daily.    Dispense:  15 tablet    Refill:  0    Discharge Instructions    COVID-19, flu A/B testing ordered.  It will take between 2-7 days for test results.  Someone will contact you regarding abnormal results.    Get plenty of rest and push fluids Tessalon Perles prescribed for cough Zyrtec for nasal congestion, runny nose, and/or sore throat Flonase for nasal congestion and runny nose Prednisone was prescribed Use medications daily for symptom relief Use OTC medications like ibuprofen or tylenol as needed fever or pain Call or go to the ED if you have any new or worsening symptoms such as fever, worsening cough, shortness of breath, chest tightness, chest pain, turning blue, changes in mental status, etc...   Reviewed expectations re: course of current medical issues. Questions answered. Outlined signs and symptoms indicating need for more acute intervention. Patient verbalized understanding. After Visit Summary given.         Durward Parcel, FNP 10/06/20 [phone]

## 2020-10-09 ENCOUNTER — Ambulatory Visit: Payer: No Typology Code available for payment source | Admitting: Obstetrics and Gynecology

## 2020-10-09 ENCOUNTER — Other Ambulatory Visit: Payer: Self-pay | Admitting: Family

## 2020-10-09 ENCOUNTER — Encounter: Payer: Self-pay | Admitting: Family

## 2020-10-10 LAB — COVID-19, FLU A+B NAA
Influenza A, NAA: NOT DETECTED
Influenza B, NAA: NOT DETECTED
SARS-CoV-2, NAA: DETECTED — AB

## 2020-10-16 ENCOUNTER — Other Ambulatory Visit: Payer: Self-pay | Admitting: Family

## 2020-10-16 ENCOUNTER — Ambulatory Visit (INDEPENDENT_AMBULATORY_CARE_PROVIDER_SITE_OTHER)
Admission: RE | Admit: 2020-10-16 | Discharge: 2020-10-16 | Disposition: A | Discharge status: Home / Self Care | Payer: 59 | Source: Ambulatory Visit | Attending: Family | Admitting: Family

## 2020-10-16 ENCOUNTER — Other Ambulatory Visit: Payer: Self-pay

## 2020-10-16 ENCOUNTER — Encounter: Payer: Self-pay | Admitting: Family

## 2020-10-16 DIAGNOSIS — R059 Cough, unspecified: Secondary | ICD-10-CM | POA: Diagnosis not present

## 2020-10-16 MED ORDER — DOXYCYCLINE HYCLATE 100 MG PO TABS: 100.0000 mg | ORAL_TABLET | Freq: Two times a day (BID) | ORAL | 0 refills | Status: DC

## 2020-10-16 MED FILL — DOXYCYCLINE HYCLATE 100 MG: 100 | 7 days supply | Qty: 14 | Fill #0

## 2020-11-01 ENCOUNTER — Other Ambulatory Visit (HOSPITAL_COMMUNITY): Payer: Self-pay

## 2020-11-02 MED FILL — AMOXICILLIN 500 MG CAPSULE: 500 | 7 days supply | Qty: 21 | Fill #0

## 2020-11-21 DIAGNOSIS — Z01419 Encounter for gynecological examination (general) (routine) without abnormal findings: Secondary | ICD-10-CM | POA: Diagnosis not present

## 2020-11-21 DIAGNOSIS — Z6841 Body Mass Index (BMI) 40.0 and over, adult: Secondary | ICD-10-CM | POA: Diagnosis not present

## 2020-11-23 ENCOUNTER — Other Ambulatory Visit (HOSPITAL_COMMUNITY): Payer: Self-pay

## 2020-11-23 MED FILL — OMRON 3 SERIES BP MONITOR D: 30 days supply | Qty: 1 | Fill #0

## 2020-11-23 MED FILL — CARESTART COVID-19 HOME TES: 4 days supply | Qty: 4 | Fill #0

## 2020-11-30 ENCOUNTER — Ambulatory Visit: Payer: 59 | Admitting: Family

## 2020-12-03 ENCOUNTER — Ambulatory Visit: Payer: 59 | Admitting: Family

## 2020-12-03 DIAGNOSIS — Z0289 Encounter for other administrative examinations: Secondary | ICD-10-CM

## 2020-12-04 ENCOUNTER — Other Ambulatory Visit: Payer: Self-pay | Admitting: Podiatry

## 2020-12-04 ENCOUNTER — Other Ambulatory Visit: Payer: Self-pay | Admitting: Family

## 2020-12-04 ENCOUNTER — Encounter: Payer: Self-pay | Admitting: Family

## 2020-12-05 NOTE — Telephone Encounter (Signed)
I have called pt and made a follow up appt for 12/07/20 @ 10:40 am for BP follow up.   She has enough medication until her office visit.   Please advise on refill or do you want to wait until she is seen.   Thanks,  [employer]

## 2020-12-07 ENCOUNTER — Encounter: Payer: Self-pay | Admitting: Family

## 2020-12-07 ENCOUNTER — Other Ambulatory Visit: Payer: Self-pay | Admitting: Family

## 2020-12-07 ENCOUNTER — Ambulatory Visit: Payer: 59 | Admitting: Family

## 2020-12-07 ENCOUNTER — Other Ambulatory Visit: Payer: Self-pay

## 2020-12-07 VITALS — BP 160/100 | HR 67 | Temp 98.2°F | Ht 60.0 in | Wt 218.8 lb

## 2020-12-07 DIAGNOSIS — I10 Essential (primary) hypertension: Secondary | ICD-10-CM

## 2020-12-07 DIAGNOSIS — R7309 Other abnormal glucose: Secondary | ICD-10-CM | POA: Diagnosis not present

## 2020-12-07 DIAGNOSIS — R5383 Other fatigue: Secondary | ICD-10-CM

## 2020-12-07 DIAGNOSIS — Z13220 Encounter for screening for lipoid disorders: Secondary | ICD-10-CM | POA: Diagnosis not present

## 2020-12-07 MED ORDER — LOSARTAN POTASSIUM-HCTZ 100-25 MG PO TABS: 1.0000 | ORAL_TABLET | Freq: Every day | ORAL | 1 refills | Status: DC
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
## 2020-12-11 ENCOUNTER — Other Ambulatory Visit (HOSPITAL_COMMUNITY): Payer: Self-pay | Admitting: Podiatry

## 2020-12-18 ENCOUNTER — Other Ambulatory Visit: Payer: Self-pay

## 2020-12-18 ENCOUNTER — Other Ambulatory Visit (INDEPENDENT_AMBULATORY_CARE_PROVIDER_SITE_OTHER): Payer: 59

## 2020-12-18 ENCOUNTER — Ambulatory Visit: Payer: 59 | Admitting: Family

## 2020-12-18 ENCOUNTER — Encounter: Payer: Self-pay | Admitting: Family

## 2020-12-18 ENCOUNTER — Telehealth: Payer: Self-pay | Admitting: Family

## 2020-12-18 VITALS — BP 118/70 | HR 72 | Temp 98.1°F | Ht 60.0 in | Wt 223.2 lb

## 2020-12-18 DIAGNOSIS — R7309 Other abnormal glucose: Secondary | ICD-10-CM

## 2020-12-18 DIAGNOSIS — I10 Essential (primary) hypertension: Secondary | ICD-10-CM

## 2020-12-18 DIAGNOSIS — R5383 Other fatigue: Secondary | ICD-10-CM

## 2020-12-18 DIAGNOSIS — Z13220 Encounter for screening for lipoid disorders: Secondary | ICD-10-CM

## 2020-12-18 LAB — COMPREHENSIVE METABOLIC PANEL
ALT: 10 U/L (ref 0–35)
AST: 11 U/L (ref 0–37)
Albumin: 4 g/dL (ref 3.5–5.2)
Alkaline Phosphatase: 79 U/L (ref 39–117)
BUN: 26 mg/dL — ABNORMAL HIGH (ref 6–23)
CO2: 26 mEq/L (ref 19–32)
Calcium: 9.1 mg/dL (ref 8.4–10.5)
Chloride: 104 mEq/L (ref 96–112)
Creatinine, Ser: 0.74 mg/dL (ref 0.40–1.20)
GFR: 99.65 mL/min (ref >60.00)
Glucose, Bld: 102 mg/dL — ABNORMAL HIGH (ref 70–99)
Potassium: 3.8 mEq/L (ref 3.5–5.1)
Sodium: 138 mEq/L (ref 135–145)
Total Bilirubin: 0.7 mg/dL (ref 0.2–1.2)
Total Protein: 6.8 g/dL (ref 6.0–8.3)

## 2020-12-18 LAB — CBC WITH DIFFERENTIAL/PLATELET
Basophils Absolute: 0 10*3/uL (ref 0.0–0.1)
Basophils Relative: 0.6 % (ref 0.0–3.0)
Eosinophils Absolute: 0.1 10*3/uL (ref 0.0–0.7)
Eosinophils Relative: 2.3 % (ref 0.0–5.0)
HCT: 37.1 % (ref 36.0–46.0)
Hemoglobin: 12.8 g/dL (ref 12.0–15.0)
Lymphocytes Relative: 24.6 % (ref 12.0–46.0)
Lymphs Abs: 1.6 10*3/uL (ref 0.7–4.0)
MCHC: 34.4 g/dL (ref 30.0–36.0)
MCV: 84.6 fl (ref 78.0–100.0)
Monocytes Absolute: 0.4 10*3/uL (ref 0.1–1.0)
Monocytes Relative: 5.7 % (ref 3.0–12.0)
Neutro Abs: 4.2 10*3/uL (ref 1.4–7.7)
Neutrophils Relative %: 66.8 % (ref 43.0–77.0)
Platelets: 184 10*3/uL (ref 150.0–400.0)
RBC: 4.39 Mil/uL (ref 3.87–5.11)
RDW: 13.4 % (ref 11.5–15.5)
WBC: 6.3 10*3/uL (ref 4.0–10.5)

## 2020-12-18 LAB — LIPID PANEL
Cholesterol: 201 mg/dL — ABNORMAL HIGH (ref 0–200)
HDL: 45.3 mg/dL
LDL Cholesterol: 138 mg/dL — ABNORMAL HIGH (ref 0–99)
NonHDL: 155.86
Total CHOL/HDL Ratio: 4
Triglycerides: 90 mg/dL (ref 0.0–149.0)
VLDL: 18 mg/dL (ref 0.0–40.0)

## 2020-12-18 LAB — TSH: TSH: 2.74 u[IU]/mL (ref 0.35–4.50)

## 2020-12-18 LAB — HEMOGLOBIN A1C: Hgb A1c MFr Bld: 5.3 % (ref 4.6–6.5)

## 2020-12-18 NOTE — Telephone Encounter (Signed)
Patient is aware that Dr. Lawerance Bach has agreed for [facility]; patient will call back in 6 months to follow-up on hypertension/ establish care; Dr. Lawerance Bach' CMA, Vira Browns is aware that patient will reach out to help facilitate scheduling.

## 2020-12-18 NOTE — Progress Notes (Signed)
Jenny Parsons is a 45 y.o. female with the following history as recorded in EpicCare:  Patient Active Problem List   Diagnosis Date Noted  . Heel spur, left 12/11/2019  . Closed fracture of right ankle 07/21/2019  . Trapezius muscle strain, right, initial encounter 05/26/2019  . Cervical paraspinal muscle spasm 05/20/2017  . Allergic rhinitis 02/02/2017  . Depression with anxiety 11/06/2016  . Snoring 11/05/2016  . Essential hypertension, benign 12/22/2011  . Routine general medical examination at a health care facility 12/22/2011  . Hypertension 05/01/2011    Current Outpatient Medications  Medication Sig Dispense Refill  . albuterol (VENTOLIN HFA) 108 (90 Base) MCG/ACT inhaler Inhale 2 puffs into the lungs every 6 (six) hours as needed for wheezing or shortness of breath. 8 g 0  . cetirizine (ZYRTEC ALLERGY) 10 MG tablet Take 1 tablet (10 mg total) by mouth daily. 30 tablet 0  . fluticasone (FLONASE) 50 MCG/ACT nasal spray Place 1 spray into both nostrils daily for 14 days. 16 g 0  . losartan-hydrochlorothiazide (HYZAAR) 100-25 MG tablet Take 1 tablet by mouth daily. 90 tablet 1  . meloxicam (MOBIC) 15 MG tablet TAKE 1 TABLET BY MOUTH DAILY 30 tablet 1  . solifenacin (VESICARE) 5 MG tablet Take 1 tablet (5 mg total) by mouth daily. 30 tablet 5   No current facility-administered medications for this visit.    Allergies: Ramipril, Keflex [cephalexin], and Percocet [oxycodone-acetaminophen]  Past Medical History:  Diagnosis Date  . Hypertension     Past Surgical History:  Procedure Laterality Date  . right ankle surgery  02/2020  . TUBAL LIGATION      Family History  Problem Relation Age of Onset  . Hypertension Mother   . Breast cancer Paternal Grandmother   . Cancer Paternal Grandmother 53       breast  . Diabetes Neg Hx   . Stroke Neg Hx     Social History   Tobacco Use  . Smoking status: Never Smoker  . Smokeless tobacco: Never Used  Substance Use  Topics  . Alcohol use: No    Subjective:  Presents for 2 week follow-up on hypertension; responding well to Losartan HCT; Denies any chest pain, shortness of breath, blurred vision or headache.  Will be transferring to new job in [place]- will plan to establish with Dr. Lawerance Bach for continued care.    Objective:  Vitals:   12/18/20 1127  BP: 118/70  Pulse: 72  Temp: 98.1 F (36.7 C)  TempSrc: Oral  SpO2: 97%  Weight: 223 lb 3.2 oz (101.2 kg)  Height: 5' (1.524 m)    General: Well developed, well nourished, in no acute distress  Skin : Warm and dry.  Head: Normocephalic and atraumatic  Eyes: Sclera and conjunctiva clear; pupils round and reactive to light; extraocular movements intact  Lungs: Respirations unlabored; clear to auscultation bilaterally without wheeze, rales, rhonchi  CVS exam: normal rate and regular rhythm.  Neurologic: Alert and oriented; speech intact; face symmetrical; moves all extremities well; CNII-XII intact without focal deficit   Assessment:  1. Essential hypertension, benign     Plan:  Stable; continue same medication; follow-up for re-check in 6 months; She will continue with GYN and plan to transfer to Dr. Lawerance Bach for continued care at this location; Reviewed labs with patient; continue working on weight loss goals.  This visit occurred during the SARS-CoV-2 public health emergency.  Safety protocols were in place, including screening questions prior to the visit, additional  usage of staff PPE, and extensive cleaning of exam room while observing appropriate contact time as indicated for disinfecting solutions.     No follow-ups on file.  No orders of the defined types were placed in this encounter.   Requested Prescriptions    No prescriptions requested or ordered in this encounter

## 2020-12-19 ENCOUNTER — Ambulatory Visit: Payer: 59 | Admitting: Family

## 2021-01-04 DIAGNOSIS — H5213 Myopia, bilateral: Secondary | ICD-10-CM | POA: Diagnosis not present

## 2021-01-08 ENCOUNTER — Other Ambulatory Visit (HOSPITAL_COMMUNITY): Payer: Self-pay

## 2021-01-09 ENCOUNTER — Other Ambulatory Visit (HOSPITAL_COMMUNITY): Payer: Self-pay

## 2021-01-10 ENCOUNTER — Other Ambulatory Visit (HOSPITAL_COMMUNITY): Payer: Self-pay

## 2021-07-24 ENCOUNTER — Other Ambulatory Visit: Payer: Self-pay | Admitting: Internal Medicine

## 2021-07-24 DIAGNOSIS — Z139 Encounter for screening, unspecified: Secondary | ICD-10-CM

## 2021-07-30 ENCOUNTER — Other Ambulatory Visit: Payer: Self-pay

## 2021-07-30 ENCOUNTER — Ambulatory Visit
Admission: RE | Admit: 2021-07-30 | Discharge: 2021-07-30 | Disposition: A | Discharge status: Home / Self Care | Payer: Commercial Managed Care - PPO | Source: Ambulatory Visit | Attending: Internal Medicine | Admitting: Internal Medicine

## 2021-07-30 DIAGNOSIS — Z139 Encounter for screening, unspecified: Secondary | ICD-10-CM

## 2021-10-30 ENCOUNTER — Other Ambulatory Visit (HOSPITAL_BASED_OUTPATIENT_CLINIC_OR_DEPARTMENT_OTHER): Payer: Self-pay

## 2022-02-02 IMAGING — MR MR ANKLE*R* W/O CM
5 series · 38 of 40 positions shown · non-contrast
Comparison: X-ray 02/13/2020

CLINICAL DATA: History of distal fibular fracture

EXAM:
MRI OF THE RIGHT ANKLE WITHOUT CONTRAST
TECHNIQUE: Multiplanar, multisequence MR imaging of the ankle was performed. No
intravenous contrast was administered.

[Series 3: T2 fat-sat · axial · right · 4.0mm · 0.42mm/px · z∈[-140,+25]mm · 9 of 34 slices shown (1 of 2)]
[im 1/34]
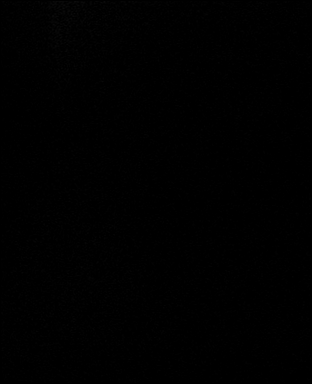
[im 5/34]
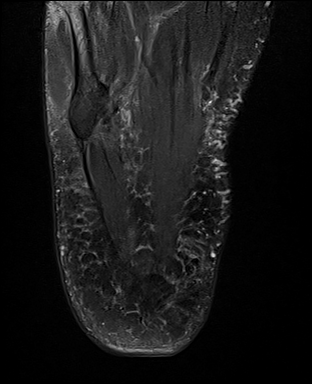
[im 9/34]
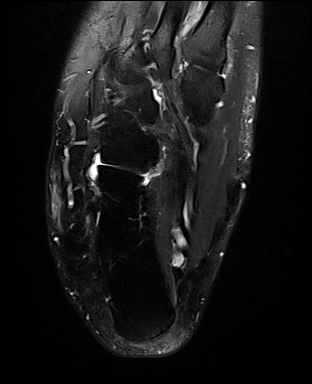
[im 13/34]
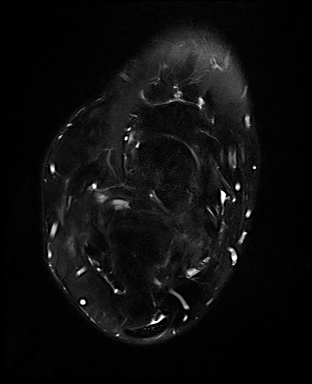
[im 17/34]
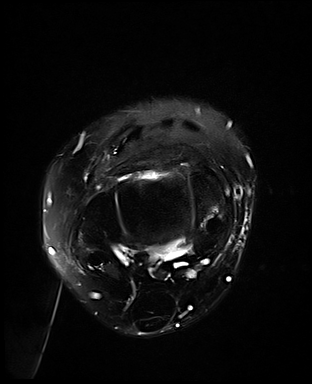
[im 21/34]
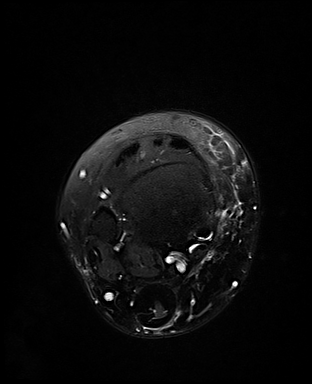
[im 25/34]
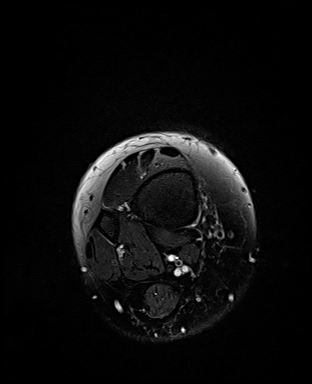
[im 29/34]
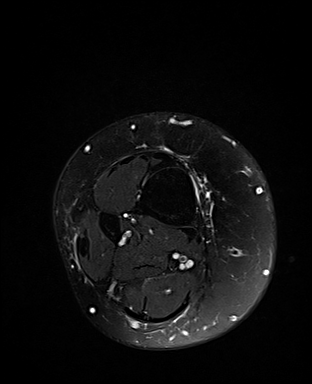
[im 34/34]
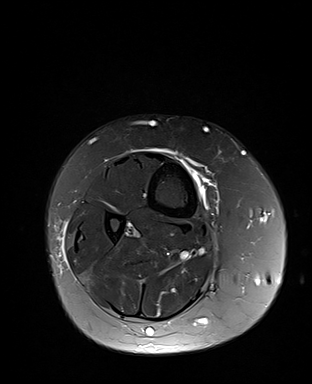

[Series 4: PD fat-sat · axial · right · 4.0mm · 0.50mm/px · z∈[-140,+25]mm · 8 of 34 slices shown]
[im 1/34]
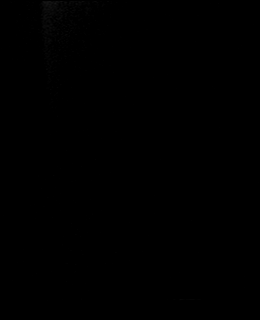
[im 5/34]
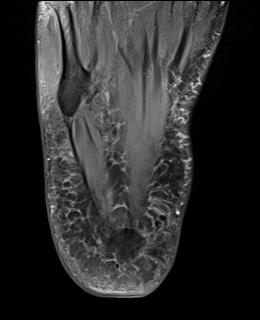
[im 10/34]
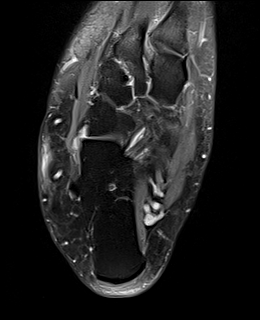
[im 15/34]
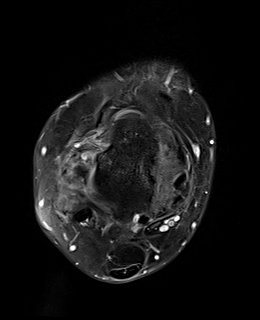
[im 19/34]
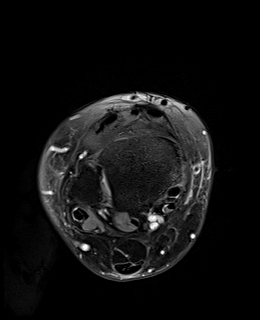
[im 24/34]
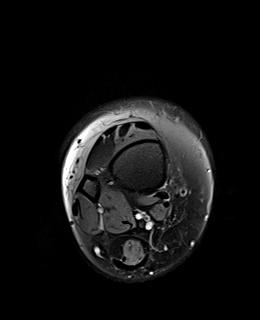
[im 29/34]
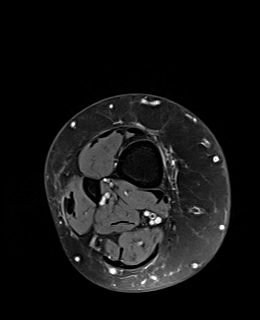
[im 34/34]
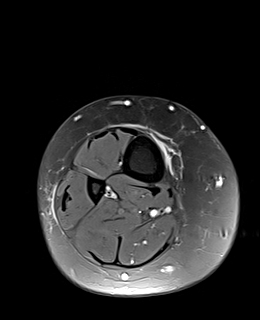

[Series 5: T2 fat-sat · coronal · right · 3.0mm · 0.50mm/px · 9 of 37 slices shown (2 of 2)]
[im 1/37]
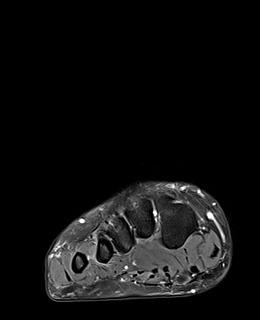
[im 5/37]
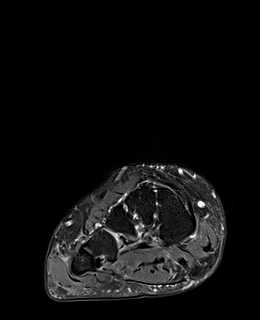
[im 10/37]
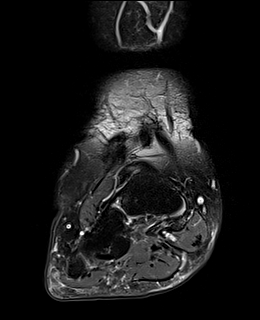
[im 14/37]
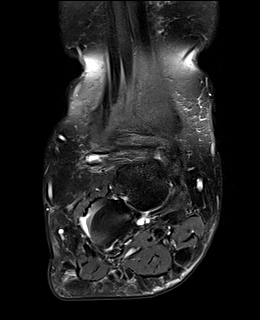
[im 19/37]
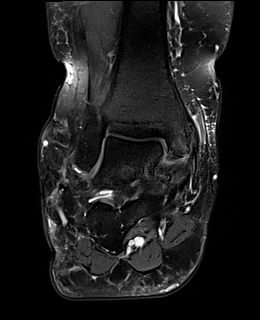
[im 23/37]
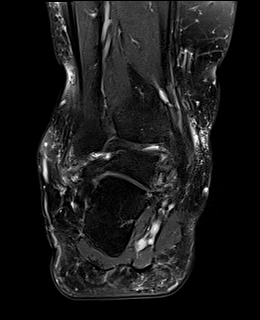
[im 28/37]
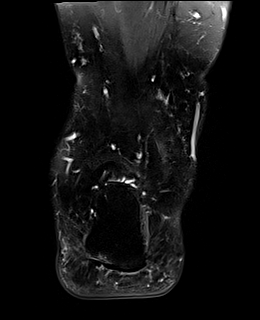
[im 32/37]
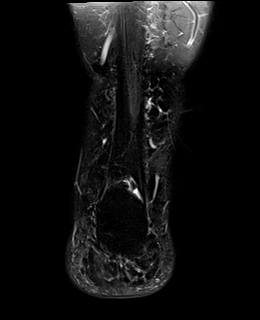
[im 37/37]
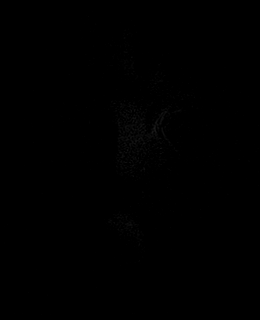

[Series 6: T1 · sagittal · right · 3.0mm · 0.50mm/px · 7 of 30 slices shown]
[im 1/30]
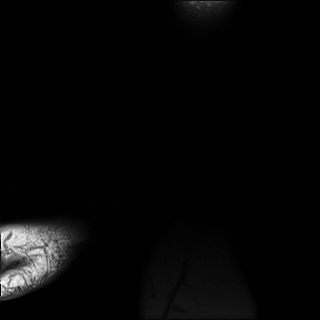
[im 5/30]
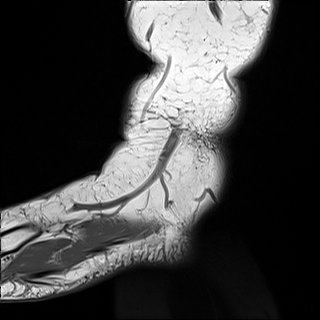
[im 10/30]
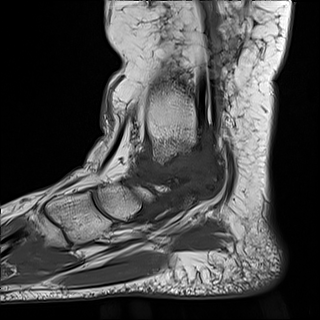
[im 15/30]
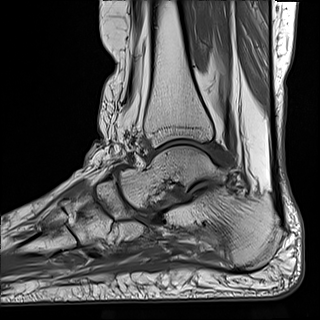
[im 20/30]
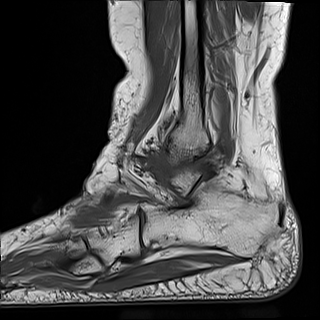
[im 25/30]
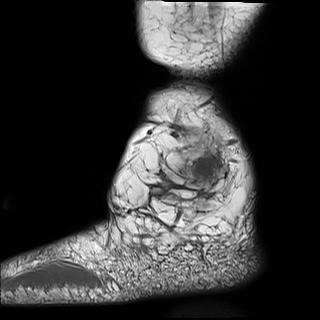
[im 30/30]
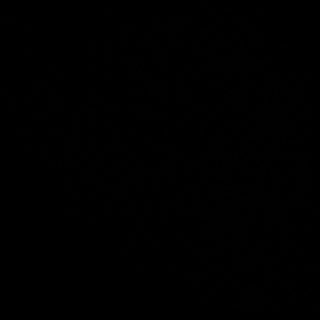

[Series 7: STIR · sagittal · right · 3.0mm · 0.31mm/px · 5 of 29 slices shown]
[im 1/29]
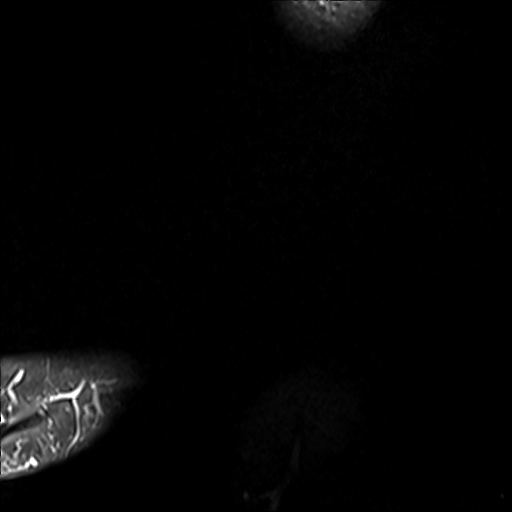
[im 5/29]
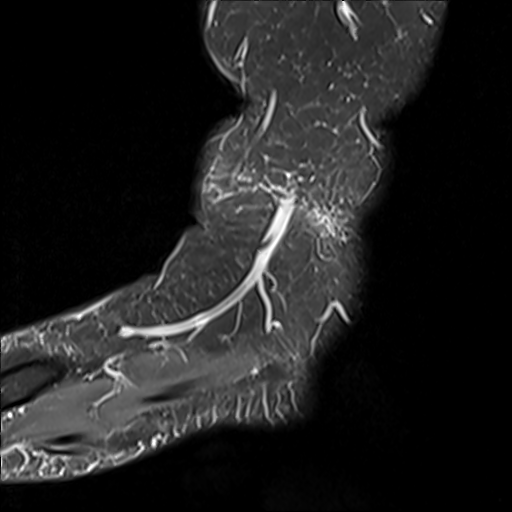
[im 10/29]
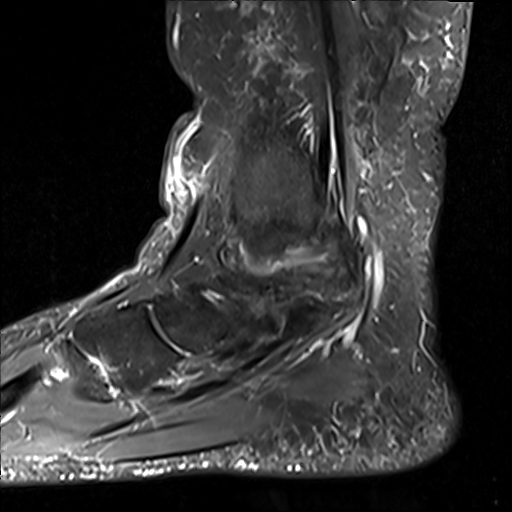
[im 15/29]
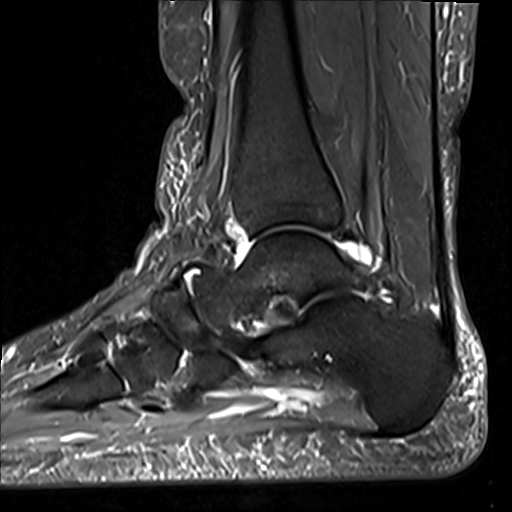
[im 19/29]
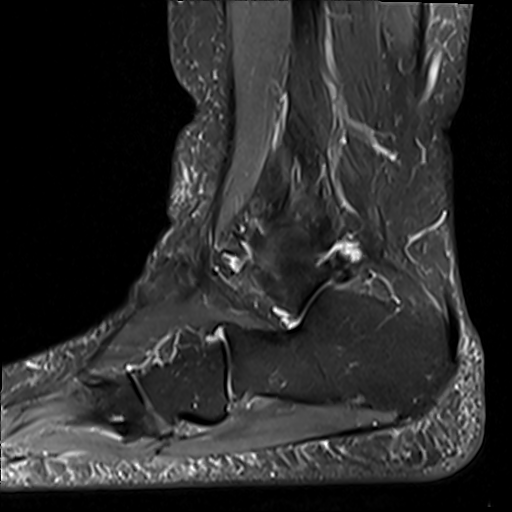

[38 of 40 positions shown; findings below may reference images not displayed]

FINDINGS: TENDONS

Peroneal: Short segment split tear of the infra malleolar aspect of
the peroneus brevis tendon (series 7, image 9). Intact peroneus
longus tendon. Trace tenosynovial fluid without peritendinitis.

Posteromedial: Intact tibialis posterior, flexor hallucis longus and
flexor digitorum longus tendons.

Anterior: Intact tibialis anterior, extensor hallucis longus and
extensor digitorum longus tendons.

Achilles: Intact.

Plantar Fascia: Intact.

LIGAMENTS

Lateral: Chronic appearing complete tear of the anterior talofibular
ligament. Posterior talofibular and calcaneofibular ligaments appear
to be intact. The anterior and posterior tibiofibular ligaments are
intact.

Medial: Markedly heterogeneous appearance of the deltoid ligament
suggesting sequela of prior trauma. Spring ligament complex appears
intact.

CARTILAGE

Ankle Joint: Small tibiotalar joint effusion.  No chondral defect.

Subtalar Joints/Sinus Tarsi: No joint effusion or chondral defect.
Preservation of the anatomic fat within the sinus tarsi.

Bones: Slight pes planovalgus alignment. Multiple small ossific
fragments inferior to the tip of the lateral malleolus compatible
with sequela of previous trauma. Mild reactive subcortical marrow
changes within the posteromedial aspect of the medial malleolus. No
evidence of an acute fracture. No dislocation. No suspicious bone
lesion.

Other: No fluid collection or hematoma
IMPRESSION: 1. Chronic appearing complete tear of the ATFL.
2. Markedly heterogeneous appearance of the deltoid ligament
suggesting sequela of prior trauma.
3. Short segment split tear of the infra-malleolar aspect of the
peroneus brevis tendon, which also appears chronic.
4. Slight pes planovalgus alignment.

## 2022-06-04 ENCOUNTER — Other Ambulatory Visit: Payer: Self-pay | Admitting: Family

## 2022-07-02 ENCOUNTER — Other Ambulatory Visit: Payer: Self-pay | Admitting: Internal Medicine

## 2022-07-02 ENCOUNTER — Ambulatory Visit
Admission: RE | Admit: 2022-07-02 | Discharge: 2022-07-02 | Disposition: A | Discharge status: Home / Self Care | Payer: BC Managed Care – PPO | Source: Ambulatory Visit | Attending: Internal Medicine | Admitting: Internal Medicine

## 2022-07-02 DIAGNOSIS — Z1231 Encounter for screening mammogram for malignant neoplasm of breast: Secondary | ICD-10-CM

## 2022-12-05 ENCOUNTER — Other Ambulatory Visit (HOSPITAL_BASED_OUTPATIENT_CLINIC_OR_DEPARTMENT_OTHER): Payer: Self-pay

## 2023-02-03 ENCOUNTER — Other Ambulatory Visit (HOSPITAL_COMMUNITY): Payer: Self-pay | Admitting: Orthopedic Surgery

## 2023-02-25 ENCOUNTER — Encounter (HOSPITAL_BASED_OUTPATIENT_CLINIC_OR_DEPARTMENT_OTHER): Payer: Self-pay | Admitting: Orthopedic Surgery

## 2023-02-25 ENCOUNTER — Other Ambulatory Visit: Payer: Self-pay

## 2023-03-02 ENCOUNTER — Encounter (HOSPITAL_BASED_OUTPATIENT_CLINIC_OR_DEPARTMENT_OTHER)
Admission: RE | Admit: 2023-03-02 | Discharge: 2023-03-02 | Disposition: A | Discharge status: Home / Self Care | Payer: BC Managed Care – PPO | Source: Ambulatory Visit | Attending: Orthopedic Surgery | Admitting: Orthopedic Surgery

## 2023-03-02 DIAGNOSIS — Z01812 Encounter for preprocedural laboratory examination: Secondary | ICD-10-CM | POA: Insufficient documentation

## 2023-03-02 DIAGNOSIS — X58XXXD Exposure to other specified factors, subsequent encounter: Secondary | ICD-10-CM | POA: Diagnosis not present

## 2023-03-02 DIAGNOSIS — Z6841 Body Mass Index (BMI) 40.0 and over, adult: Secondary | ICD-10-CM | POA: Diagnosis not present

## 2023-03-02 DIAGNOSIS — S8261XK Displaced fracture of lateral malleolus of right fibula, subsequent encounter for closed fracture with nonunion: Secondary | ICD-10-CM | POA: Diagnosis not present

## 2023-03-02 DIAGNOSIS — M25371 Other instability, right ankle: Secondary | ICD-10-CM | POA: Diagnosis present

## 2023-03-02 DIAGNOSIS — M25471 Effusion, right ankle: Secondary | ICD-10-CM | POA: Diagnosis not present

## 2023-03-02 LAB — BASIC METABOLIC PANEL
Anion gap: 7 (ref 5–15)
BUN: 11 mg/dL (ref 6–20)
CO2: 25 mmol/L (ref 22–32)
Calcium: 9.2 mg/dL (ref 8.9–10.3)
Chloride: 104 mmol/L (ref 98–111)
Creatinine, Ser: 0.73 mg/dL (ref 0.44–1.00)
GFR, Estimated: >60 mL/min (ref >60)
Glucose, Bld: 98 mg/dL (ref 70–99)
Potassium: 3 mmol/L — ABNORMAL LOW (ref 3.5–5.1)
Sodium: 136 mmol/L (ref 135–145)
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
## 2023-03-05 ENCOUNTER — Other Ambulatory Visit: Payer: Self-pay

## 2023-03-05 ENCOUNTER — Ambulatory Visit (HOSPITAL_BASED_OUTPATIENT_CLINIC_OR_DEPARTMENT_OTHER): Payer: BC Managed Care – PPO | Admitting: Certified Registered Nurse Anesthetist

## 2023-03-05 ENCOUNTER — Ambulatory Visit (HOSPITAL_BASED_OUTPATIENT_CLINIC_OR_DEPARTMENT_OTHER): Payer: BC Managed Care – PPO

## 2023-03-05 ENCOUNTER — Encounter (HOSPITAL_BASED_OUTPATIENT_CLINIC_OR_DEPARTMENT_OTHER): Payer: Self-pay | Admitting: Orthopedic Surgery

## 2023-03-05 ENCOUNTER — Encounter (HOSPITAL_BASED_OUTPATIENT_CLINIC_OR_DEPARTMENT_OTHER)
Admission: RE | Disposition: A | Discharge status: Home / Self Care | Payer: Self-pay | Source: Home / Self Care | Attending: Orthopedic Surgery

## 2023-03-05 ENCOUNTER — Ambulatory Visit (HOSPITAL_BASED_OUTPATIENT_CLINIC_OR_DEPARTMENT_OTHER)
Admission: RE | Admit: 2023-03-05 | Discharge: 2023-03-05 | Disposition: A | Discharge status: Home / Self Care | Payer: BC Managed Care – PPO | Attending: Orthopedic Surgery | Admitting: Orthopedic Surgery

## 2023-03-05 DIAGNOSIS — M25371 Other instability, right ankle: Secondary | ICD-10-CM | POA: Insufficient documentation

## 2023-03-05 DIAGNOSIS — Z01818 Encounter for other preprocedural examination: Secondary | ICD-10-CM

## 2023-03-05 DIAGNOSIS — Z6841 Body Mass Index (BMI) 40.0 and over, adult: Secondary | ICD-10-CM | POA: Insufficient documentation

## 2023-03-05 DIAGNOSIS — X58XXXD Exposure to other specified factors, subsequent encounter: Secondary | ICD-10-CM | POA: Insufficient documentation

## 2023-03-05 DIAGNOSIS — S8261XK Displaced fracture of lateral malleolus of right fibula, subsequent encounter for closed fracture with nonunion: Secondary | ICD-10-CM | POA: Insufficient documentation

## 2023-03-05 DIAGNOSIS — M25471 Effusion, right ankle: Secondary | ICD-10-CM | POA: Insufficient documentation

## 2023-03-05 HISTORY — PX: LIGAMENT REPAIR: SHX5444

## 2023-03-05 HISTORY — PX: BONE EXOSTOSIS EXCISION: SHX1249

## 2023-03-05 HISTORY — PX: SYNOVECTOMY: SHX5180

## 2023-03-05 HISTORY — DX: Prediabetes: R73.03

## 2023-03-05 HISTORY — PX: ANKLE ARTHROSCOPY: SHX545

## 2023-03-05 LAB — GLUCOSE, CAPILLARY
Glucose-Capillary: 78 mg/dL (ref 70–99)
Glucose-Capillary: 88 mg/dL (ref 70–99)

## 2023-03-05 LAB — POCT PREGNANCY, URINE: Preg Test, Ur: NEGATIVE

## 2023-03-05 SURGERY — ARTHROSCOPY, ANKLE
Anesthesia: General | Site: Ankle | Laterality: Right

## 2023-03-05 MED ORDER — MIDAZOLAM HCL 2 MG/2ML IJ SOLN
1.0000 mg | Freq: Once | INTRAMUSCULAR | Status: AC
  Administered 2023-03-05: 1 mg via INTRAVENOUS

## 2023-03-05 MED ORDER — BUPIVACAINE-EPINEPHRINE (PF) 0.25% -1:200000 IJ SOLN
INTRAMUSCULAR | Status: AC
  Filled 2023-03-05: qty 30

## 2023-03-05 MED ORDER — DEXAMETHASONE SODIUM PHOSPHATE 10 MG/ML IJ SOLN
INTRAMUSCULAR | Status: DC | PRN
  Administered 2023-03-05: 4 mg via INTRAVENOUS

## 2023-03-05 MED ORDER — PROPOFOL 10 MG/ML IV BOLUS
INTRAVENOUS | Status: DC | PRN
  Administered 2023-03-05: 200 mg via INTRAVENOUS

## 2023-03-05 MED ORDER — LIDOCAINE HCL (CARDIAC) PF 100 MG/5ML IV SOSY
PREFILLED_SYRINGE | INTRAVENOUS | Status: DC | PRN
  Administered 2023-03-05: 30 mg via INTRAVENOUS

## 2023-03-05 MED ORDER — SODIUM CHLORIDE 0.9 % IR SOLN
Status: DC | PRN
  Administered 2023-03-05: 1000 mL

## 2023-03-05 MED ORDER — FENTANYL CITRATE (PF) 100 MCG/2ML IJ SOLN: 25.0000 ug | INTRAMUSCULAR | Status: DC | PRN

## 2023-03-05 MED ORDER — DOCUSATE SODIUM 100 MG PO CAPS: 100.0000 mg | ORAL_CAPSULE | Freq: Two times a day (BID) | ORAL | 0 refills | Status: AC

## 2023-03-05 MED ORDER — LACTATED RINGERS IV SOLN: INTRAVENOUS | Status: DC

## 2023-03-05 MED ORDER — FENTANYL CITRATE (PF) 100 MCG/2ML IJ SOLN
INTRAMUSCULAR | Status: AC
  Filled 2023-03-05: qty 2

## 2023-03-05 MED ORDER — SENNA 8.6 MG PO TABS: 2.0000 | ORAL_TABLET | Freq: Two times a day (BID) | ORAL | 0 refills | Status: AC

## 2023-03-05 MED ORDER — LEVOFLOXACIN IN D5W 500 MG/100ML IV SOLN: 500.0000 mg | INTRAVENOUS | Status: DC

## 2023-03-05 MED ORDER — FENTANYL CITRATE (PF) 100 MCG/2ML IJ SOLN
50.0000 ug | Freq: Once | INTRAMUSCULAR | Status: AC
  Administered 2023-03-05: 50 ug via INTRAVENOUS

## 2023-03-05 MED ORDER — PROPOFOL 500 MG/50ML IV EMUL
INTRAVENOUS | Status: DC | PRN
  Administered 2023-03-05: 25 ug/kg/min via INTRAVENOUS

## 2023-03-05 MED ORDER — LEVOFLOXACIN IN D5W 500 MG/100ML IV SOLN
INTRAVENOUS | Status: AC
  Filled 2023-03-05: qty 100

## 2023-03-05 MED ORDER — RIVAROXABAN 10 MG PO TABS: 10.0000 mg | ORAL_TABLET | Freq: Every day | ORAL | 0 refills | Status: AC

## 2023-03-05 MED ORDER — SODIUM CHLORIDE 0.9 % IV SOLN: INTRAVENOUS | Status: DC

## 2023-03-05 MED ORDER — VANCOMYCIN HCL IN DEXTROSE 1-5 GM/200ML-% IV SOLN: 1000.0000 mg | INTRAVENOUS | Status: DC

## 2023-03-05 MED ORDER — ROPIVACAINE HCL 5 MG/ML IJ SOLN
INTRAMUSCULAR | Status: DC | PRN
  Administered 2023-03-05: 15 mL via PERINEURAL
  Administered 2023-03-05: 25 mL via PERINEURAL

## 2023-03-05 MED ORDER — ONDANSETRON HCL 4 MG/2ML IJ SOLN
INTRAMUSCULAR | Status: DC | PRN
  Administered 2023-03-05: 4 mg via INTRAVENOUS

## 2023-03-05 MED ORDER — ONDANSETRON HCL 4 MG PO TABS: 4.0000 mg | ORAL_TABLET | Freq: Three times a day (TID) | ORAL | 0 refills | Status: AC | PRN

## 2023-03-05 MED ORDER — MIDAZOLAM HCL 2 MG/2ML IJ SOLN
INTRAMUSCULAR | Status: AC
  Filled 2023-03-05: qty 2

## 2023-03-05 MED ORDER — VANCOMYCIN HCL IN DEXTROSE 1-5 GM/200ML-% IV SOLN
INTRAVENOUS | Status: AC
  Filled 2023-03-05: qty 200

## 2023-03-05 MED ORDER — OXYCODONE HCL 5 MG PO TABS: 5.0000 mg | ORAL_TABLET | ORAL | 0 refills | Status: AC | PRN

## 2023-03-05 MED ORDER — ONDANSETRON HCL 4 MG/2ML IJ SOLN: 4.0000 mg | Freq: Four times a day (QID) | INTRAMUSCULAR | Status: DC | PRN

## 2023-03-05 MED ORDER — VANCOMYCIN HCL 500 MG IV SOLR
INTRAVENOUS | Status: AC
  Filled 2023-03-05: qty 10

## 2023-03-05 MED ORDER — VANCOMYCIN HCL 500 MG IV SOLR
INTRAVENOUS | Status: DC | PRN
  Administered 2023-03-05: 500 mg via TOPICAL

## 2023-03-05 SURGICAL SUPPLY — 102 items
ANCH SUT 2 SHRT 1.45 DRLBT (Anchor) ×2 IMPLANT
ANCHOR JUGGERKNOT W/DRL 2/1.45 (Anchor) IMPLANT
APL PRP STRL LF DISP 70% ISPRP (MISCELLANEOUS) ×1
BANDAGE ESMARK 6X9 LF (GAUZE/BANDAGES/DRESSINGS) IMPLANT
BLADE AVERAGE 25X9 (BLADE) IMPLANT
BLADE MINI RND TIP GREEN BEAV (BLADE) IMPLANT
BLADE OSC/SAG .038X5.5 CUT EDG (BLADE) IMPLANT
BLADE SURG 15 STRL LF DISP TIS (BLADE) ×4 IMPLANT
BLADE SURG 15 STRL SS (BLADE) ×3
BNDG CMPR 5X4 KNIT ELC UNQ LF (GAUZE/BANDAGES/DRESSINGS) ×1
BNDG CMPR 5X62 HK CLSR LF (GAUZE/BANDAGES/DRESSINGS) ×1
BNDG CMPR 6"X 5 YARDS HK CLSR (GAUZE/BANDAGES/DRESSINGS) ×1
BNDG CMPR 9X4 STRL LF SNTH (GAUZE/BANDAGES/DRESSINGS)
BNDG CMPR 9X6 STRL LF SNTH (GAUZE/BANDAGES/DRESSINGS)
BNDG ELASTIC 4INX 5YD STR LF (GAUZE/BANDAGES/DRESSINGS) ×2 IMPLANT
BNDG ELASTIC 6INX 5YD STR LF (GAUZE/BANDAGES/DRESSINGS) IMPLANT
BNDG ESMARK 4X9 LF (GAUZE/BANDAGES/DRESSINGS) IMPLANT
BNDG ESMARK 6X9 LF (GAUZE/BANDAGES/DRESSINGS)
BNDG GAUZE DERMACEA FLUFF 4 (GAUZE/BANDAGES/DRESSINGS) ×2 IMPLANT
BNDG GZE DERMACEA 4 6PLY (GAUZE/BANDAGES/DRESSINGS) ×1
BOOT STEPPER DURA LG (SOFTGOODS) IMPLANT
BOOT STEPPER DURA MED (SOFTGOODS) IMPLANT
BOOT STEPPER DURA SM (SOFTGOODS) IMPLANT
BURR OVAL 8 FLU 4.0X13 (MISCELLANEOUS) IMPLANT
CANISTER SUCT 1200ML W/VALVE (MISCELLANEOUS) ×2 IMPLANT
CHLORAPREP W/TINT 26 (MISCELLANEOUS) ×2 IMPLANT
COVER BACK TABLE 60X90IN (DRAPES) ×2 IMPLANT
CUFF TOURN SGL QUICK 24 (TOURNIQUET CUFF)
CUFF TOURN SGL QUICK 34 (TOURNIQUET CUFF) ×1
CUFF TRNQT CYL 24X4X16.5-23 (TOURNIQUET CUFF) ×2 IMPLANT
CUFF TRNQT CYL 34X4.125X (TOURNIQUET CUFF) IMPLANT
DISSECTOR 3.8MM X 13CM (MISCELLANEOUS) IMPLANT
DRAPE EXTREMITY T 121X128X90 (DISPOSABLE) ×2 IMPLANT
DRAPE OEC MINIVIEW 54X84 (DRAPES) ×2 IMPLANT
DRAPE SURG 17X23 STRL (DRAPES) IMPLANT
DRAPE U-SHAPE 47X51 STRL (DRAPES) ×2 IMPLANT
DRSG MEPITEL 4X7.2 (GAUZE/BANDAGES/DRESSINGS) ×2 IMPLANT
ELECT REM PT RETURN 9FT ADLT (ELECTROSURGICAL) ×1
ELECTRODE REM PT RTRN 9FT ADLT (ELECTROSURGICAL) ×2 IMPLANT
EXCALIBUR 3.8MM X 13CM (MISCELLANEOUS) IMPLANT
GAUZE PAD ABD 8X10 STRL (GAUZE/BANDAGES/DRESSINGS) ×2 IMPLANT
GAUZE SPONGE 4X4 12PLY STRL (GAUZE/BANDAGES/DRESSINGS) ×2 IMPLANT
GLOVE BIO SURGEON STRL SZ 6.5 (GLOVE) IMPLANT
GLOVE BIO SURGEON STRL SZ8 (GLOVE) ×2 IMPLANT
GLOVE BIOGEL PI IND STRL 7.0 (GLOVE) IMPLANT
GLOVE BIOGEL PI IND STRL 8 (GLOVE) ×4 IMPLANT
GLOVE ECLIPSE 8.0 STRL XLNG CF (GLOVE) ×2 IMPLANT
GOWN STRL REUS W/ TWL LRG LVL3 (GOWN DISPOSABLE) ×2 IMPLANT
GOWN STRL REUS W/ TWL XL LVL3 (GOWN DISPOSABLE) ×4 IMPLANT
GOWN STRL REUS W/TWL LRG LVL3 (GOWN DISPOSABLE) ×1
GOWN STRL REUS W/TWL XL LVL3 (GOWN DISPOSABLE) ×2
K-WIRE DBL .054X9 NSTRL (WIRE)
K-WIRE DBL .062X4 NSTRL (WIRE)
KWIRE DBL .054X9 NSTRL (WIRE) IMPLANT
KWIRE DBL .062X4 NSTRL (WIRE) IMPLANT
MANIFOLD NEPTUNE II (INSTRUMENTS) ×2 IMPLANT
NDL HYPO 22X1.5 SAFETY MO (MISCELLANEOUS) ×2 IMPLANT
NDL HYPO 25X1 1.5 SAFETY (NEEDLE) IMPLANT
NDL SUT 6 .5 CRC .975X.05 MAYO (NEEDLE) IMPLANT
NEEDLE HYPO 22X1.5 SAFETY MO (MISCELLANEOUS) ×1 IMPLANT
NEEDLE HYPO 25X1 1.5 SAFETY (NEEDLE) IMPLANT
NEEDLE MAYO TAPER (NEEDLE)
NS IRRIG 1000ML POUR BTL (IV SOLUTION) ×2 IMPLANT
PACK ARTHROSCOPY DSU (CUSTOM PROCEDURE TRAY) ×2 IMPLANT
PACK BASIN DAY SURGERY FS (CUSTOM PROCEDURE TRAY) ×2 IMPLANT
PAD CAST 4YDX4 CTTN HI CHSV (CAST SUPPLIES) ×2 IMPLANT
PADDING CAST ABS COTTON 4X4 ST (CAST SUPPLIES) IMPLANT
PADDING CAST COTTON 4X4 STRL (CAST SUPPLIES) ×1
PADDING CAST COTTON 6X4 STRL (CAST SUPPLIES) IMPLANT
PASSER SUT SWANSON 36MM LOOP (INSTRUMENTS) IMPLANT
PENCIL SMOKE EVACUATOR (MISCELLANEOUS) ×2 IMPLANT
PORT APPOLLO RF 90DEGREE MULTI (SURGICAL WAND) IMPLANT
RETRIEVER SUT HEWSON (MISCELLANEOUS) IMPLANT
SANITIZER HAND PURELL FF 515ML (MISCELLANEOUS) ×2 IMPLANT
SHAVER DISSECTOR 3.0 (BURR) IMPLANT
SHAVER SABRE 2.0 (BURR) IMPLANT
SHEET MEDIUM DRAPE 40X70 STRL (DRAPES) ×2 IMPLANT
SLEEVE SCD COMPRESS KNEE MED (STOCKING) ×2 IMPLANT
SPIKE FLUID TRANSFER (MISCELLANEOUS) IMPLANT
SPLINT PLASTER CAST FAST 5X30 (CAST SUPPLIES) IMPLANT
SPONGE SURGIFOAM ABS GEL 12-7 (HEMOSTASIS) IMPLANT
SPONGE T-LAP 18X18 ~~LOC~~+RFID (SPONGE) ×2 IMPLANT
STOCKINETTE 6 STRL (DRAPES) ×2 IMPLANT
STRAP ANKLE FOOT DISTRACTOR (ORTHOPEDIC SUPPLIES) ×2 IMPLANT
SUCTION TUBE FRAZIER 10FR DISP (SUCTIONS) ×2 IMPLANT
SUT ETHIBOND 0 MO6 C/R (SUTURE) IMPLANT
SUT ETHIBOND 2 OS 4 DA (SUTURE) IMPLANT
SUT ETHILON 3 0 PS 1 (SUTURE) ×2 IMPLANT
SUT FIBERWIRE 2-0 18 17.9 3/8 (SUTURE)
SUT MNCRL AB 3-0 PS2 18 (SUTURE) ×2 IMPLANT
SUT VIC AB 2-0 SH 27 (SUTURE)
SUT VIC AB 2-0 SH 27XBRD (SUTURE) IMPLANT
SUT VICRYL 0 SH 27 (SUTURE) IMPLANT
SUT VICRYL 0 UR6 27IN ABS (SUTURE) IMPLANT
SUTURE FIBERWR 2-0 18 17.9 3/8 (SUTURE) IMPLANT
SYR BULB EAR ULCER 3OZ GRN STR (SYRINGE) ×2 IMPLANT
SYR CONTROL 10ML LL (SYRINGE) IMPLANT
TOWEL GREEN STERILE FF (TOWEL DISPOSABLE) ×4 IMPLANT
TUBE CONNECTING 20X1/4 (TUBING) ×2 IMPLANT
TUBING ARTHROSCOPY IRRIG 16FT (MISCELLANEOUS) ×2 IMPLANT
UNDERPAD 30X36 HEAVY ABSORB (UNDERPADS AND DIAPERS) ×2 IMPLANT
YANKAUER SUCT BULB TIP NO VENT (SUCTIONS) IMPLANT

## 2023-03-05 NOTE — Anesthesia Procedure Notes (Addendum)
Anesthesia Regional Block: Popliteal block   Pre-Anesthetic Checklist: , timeout performed,  Correct Patient, Correct Site, Correct Laterality,  Correct Procedure, Correct Position, site marked,  Risks and benefits discussed,  Surgical consent,  Pre-op evaluation,  At surgeon's request and post-op pain management  Laterality: Right  Prep: chloraprep       Needles:  Injection technique: Single-shot  Needle Type: Echogenic Stimulator Needle          Additional Needles:   Procedures:, nerve stimulator,,,,,     Nerve Stimulator or Paresthesia:  Response: plantar flexion of foot, 0.45 mA  Additional Responses:   Narrative:  Start time: 03/05/2023 7:03 AM End time: 03/05/2023 7:10 AM Injection made incrementally with aspirations every 5 mL.  Performed by: Personally  Anesthesiologist: Achille Rich, MD  Additional Notes: Functioning IV was confirmed and monitors were applied.  A 90mm 21ga Arrow echogenic stimulator needle was used. Sterile prep and drape,hand hygiene and sterile gloves were used.  Negative aspiration and negative test dose prior to incremental administration of local anesthetic. The patient tolerated the procedure well.  Ultrasound guidance: relevent anatomy identified, needle position confirmed, local anesthetic spread visualized around nerve(s), vascular puncture avoided.  Image printed for medical record.

## 2023-03-05 NOTE — Anesthesia Preprocedure Evaluation (Signed)
Anesthesia Evaluation  Patient identified by MRN, date of birth, ID band Patient awake    Reviewed: Allergy & Precautions, H&P , NPO status , Patient's Chart, lab work & pertinent test results  Airway Mallampati: II   Neck ROM: full    Dental   Pulmonary neg pulmonary ROS   breath sounds clear to auscultation       Cardiovascular hypertension,  Rhythm:regular Rate:Normal     Neuro/Psych  PSYCHIATRIC DISORDERS Anxiety Depression       GI/Hepatic   Endo/Other    Morbid obesity  Renal/GU      Musculoskeletal   Abdominal   Peds  Hematology   Anesthesia Other Findings   Reproductive/Obstetrics                             Anesthesia Physical Anesthesia Plan  ASA: 2  Anesthesia Plan: General   Post-op Pain Management: Regional block*   Induction: Intravenous  PONV Risk Score and Plan: 3 and Ondansetron, Dexamethasone, Midazolam and Treatment may vary due to age or medical condition  Airway Management Planned: LMA  Additional Equipment:   Intra-op Plan:   Post-operative Plan: Extubation in OR  Informed Consent: I have reviewed the patients History and Physical, chart, labs and discussed the procedure including the risks, benefits and alternatives for the proposed anesthesia with the patient or authorized representative who has indicated his/her understanding and acceptance.     Dental advisory given  Plan Discussed with: CRNA, Anesthesiologist and Surgeon  Anesthesia Plan Comments:        Anesthesia Quick Evaluation

## 2023-03-05 NOTE — Transfer of Care (Signed)
Immediate Anesthesia Transfer of Care Note  Patient: Jenny Parsons  Procedure(s) Performed: ANKLE ARTHROSCOPY WITH EXTENSIVE DEBRIDEMENT (Right: Ankle) EXOSTECTOMY OF LATERAL MALLEOLUS (Right: Ankle) SYNOVECTOMY OF SUBLATALAR JOINT (Right: Ankle) LATERAL LIGAMENT RECONSTRUCTION WITH PERONEAL TENDON TENOLYSIS (Right: Ankle)  Patient Location: PACU  Anesthesia Type:GA combined with regional for post-op pain  Level of Consciousness: drowsy and patient cooperative  Airway & Oxygen Therapy: Patient Spontanous Breathing and Patient connected to face mask oxygen  Post-op Assessment: Report given to RN and Post -op Vital signs reviewed and stable  Post vital signs: Reviewed and stable  Last Vitals:  Vitals Value Taken Time  BP    Temp    Pulse 65 03/05/23 0909  Resp 13 03/05/23 0909  SpO2 98 % 03/05/23 0909  Vitals shown include unvalidated device data.  Last Pain:  Vitals:   03/05/23 0629  TempSrc: Oral  PainSc: 0-No pain         Complications: No notable events documented.

## 2023-03-05 NOTE — Anesthesia Procedure Notes (Signed)
Procedure Name: LMA Insertion Date/Time: 03/05/2023 7:37 AM  Performed by: Merina Behrendt, Jewel Baize, CRNAPre-anesthesia Checklist: Patient identified, Emergency Drugs available, Suction available and Patient being monitored Patient Re-evaluated:Patient Re-evaluated prior to induction Oxygen Delivery Method: Circle system utilized Preoxygenation: Pre-oxygenation with 100% oxygen Induction Type: IV induction Ventilation: Mask ventilation without difficulty LMA: LMA inserted LMA Size: 4.0 Number of attempts: 1 Airway Equipment and Method: Bite block Placement Confirmation: positive ETCO2 Tube secured with: Tape Dental Injury: Teeth and Oropharynx as per pre-operative assessment

## 2023-03-05 NOTE — Op Note (Signed)
03/05/2023  9:05 AM  PATIENT:  Jenny Parsons  47 y.o. female  PRE-OPERATIVE DIAGNOSIS: 1.  Right ankle synovitis and effusion      2.  Right peroneus brevis and longus tenosynovitis      3.  Right ankle chronic lateral instability      4.  Right subtalar joint synovitis      5.  Right fibula Weber A fracture nonunion  POST-OPERATIVE DIAGNOSIS:  same  Procedure(s): Right ANKLE ARTHROSCOPY WITH EXTENSIVE DEBRIDEMENT Right peroneus brevis and longus tenolysis Right fibula exostectomy 4.  Right Subtalar joint arthrotomy and synovectomy 5.  Right ankle LATERAL LIGAMENT RECONSTRUCTION  SURGEON:  Toni Arthurs, MD  ASSISTANT: Alfredo Martinez, PA-C  ANESTHESIA:   General, regional  EBL:  minimal   TOURNIQUET:   Total Tourniquet Time Documented: Thigh (Right) - 71 minutes Total: Thigh (Right) - 71 minutes  COMPLICATIONS:  None apparent  DISPOSITION:  Extubated, awake and stable to recovery.  INDICATION FOR PROCEDURE: 47 year old female with past medical history significant for right ankle injury and was treated with laxatives couple years ago.  She has previously undergone peroneal tendon repair and lateral ligament repair by a podiatrist.  She has developed chronic instability of ankle and has a nonunion of a previous Weber a fracture of the lateral malleolus.  She also has ankle synovitis with effusion.  She has signs and symptoms of peroneal tendon tenosynovitis and tingling into the consult room subtalar joint synovitis and effusion.  She has failed nonoperative treatment to date and presents for surgery.  The risks and benefits of the alternative treatment options have been discussed in detail.  The patient wishes to proceed with surgery and specifically understands risks of bleeding, infection, nerve damage, blood clots, need for additional surgery, amputation and death.   PROCEDURE IN DETAIL:  After pre operative consent was obtained, and the correct operative site was  identified, the patient was brought to the operating room and placed supine on the OR table.  Anesthesia was administered.  Pre-operative antibiotics were administered.  A surgical timeout was taken.  The right lower extremity was prepped and draped in standard sterile fashion with a tourniquet.  The extremity was elevated, and the tourniquet was inflated to 300 mmHg.  An anteromedial arthroscopy portal was established using the nick and spread technique.  The arthroscope was inserted into the ankle joint.  An anterolateral portal was established in the same fashion.  The shaver was inserted.  The ankle was noted to have significant synovitis at the anterior gutter.  This was resected with a shaver allowing excellent visualization of the joint.  The medial gutter was also noted to have some synovitis.  Otherwise no loose body.  The talar dome medial ankle is healthy.  Laterally there was grade 1and 2 chondromalacia at the anterior talar dome consistent with lateral ligament instability.  No evidence of osteochondral lesion of the talar dome medially or laterally.  The syndesmosis appeared stable.  The lateral gutter had some synovitis was which was resected with a shaver.  Posterior gutter appeared healthy with no loose body.  The tibial plafond had generally healthy cartilage.  The arthroscope was then placed in the lateral portal.  The shaver was placed in the medial portal and was used to resect the medial gutter synovitis.  Arthroscopic instruments were removed.  Attention was turned to the lateral ankle where the previous incision was identified.  It was opened again sharply and dissection carried down through the subcutaneous  tissues.  The peroneal tendons were identified.  Both peroneus brevis and peroneus longus were noted to have significant tenosynovitis.  This was sharply excised with scissors.  Ethibond suture was removed from the peroneus brevis.  Degeneration at the previous tendon repair was  debrided with scissors.  Otherwise there was no tear evident.  The subtalar joint was carried through the sinus Tarsi.  There was no significant degenerative change of the articular surfaces.  Synovitis was resected from the lateral portion of the joint.  The calcaneofibular ligament appeared scarred but intact.  Distal fibula was identified.  Subperiosteal dissection was carried around the distal fibula at the level of the fracture nonunion.  The nonunited fragments were removed in their entirety.  A separate fragment was removed from the ATFL ligament.  The distal end of the fibula was then decorticated.  2 short rigid juggernaut suture anchors were inserted.  Sutures were passed down through the ATFL and CFL.  Both were repaired to the fibula.  Sutures were then passed through the retinaculum which was advanced onto the fibula.  Sutures were then passed through the periosteum of the distal fibula and tied.  The ankle now had a grade 0 anterior drawer neutral and plantarflexion.  Wound was irrigated copiously and sprinkled with vancomycin powder.  Subcutaneous tissues were approximated with 2-0 Vicryl.  The incision was closed with horizontal mattress sutures of 3-0 nylon.  Sterile dressings were applied followed by well-padded short leg splint.  Tourniquet was released after application of the dressings.  The patient was awakened from anesthesia and transported to the recovery room in stable condition.   FOLLOW UP PLAN: Nonweightbearing on the right lower extremity.  Follow-up in 2 weeks for suture removal and conversion to a short leg cast.  Plan weightbearing in the short leg cast for a month before transitioning to a cam boot for gentle plantarflexion and dorsiflexion range of motion.  Xarelto for DVT prophylaxis.    Alfredo Martinez PA-C was present and scrubbed for the duration of the operative case. His assistance was essential in positioning the patient, prepping and draping, gaining and maintaining  exposure, performing the operation, closing and dressing the wounds and applying the splint.

## 2023-03-05 NOTE — Progress Notes (Addendum)
Assisted Dr. Chaney Malling with right, ultrasound guided adductor canal, popliteal block. Side rails up, monitors on throughout procedure. See vital signs in flow sheet. Tolerated Procedure well.

## 2023-03-05 NOTE — Anesthesia Postprocedure Evaluation (Signed)
Anesthesia Post Note  Patient: Jenny Parsons  Procedure(s) Performed: ANKLE ARTHROSCOPY WITH EXTENSIVE DEBRIDEMENT (Right: Ankle) EXOSTECTOMY OF LATERAL MALLEOLUS (Right: Ankle) SYNOVECTOMY OF SUBLATALAR JOINT (Right: Ankle) LATERAL LIGAMENT RECONSTRUCTION WITH PERONEAL TENDON TENOLYSIS (Right: Ankle)     Patient location during evaluation: PACU Anesthesia Type: General and Regional Level of consciousness: awake and alert Pain management: pain level controlled Vital Signs Assessment: post-procedure vital signs reviewed and stable Respiratory status: spontaneous breathing, nonlabored ventilation, respiratory function stable and patient connected to nasal cannula oxygen Cardiovascular status: blood pressure returned to baseline and stable Postop Assessment: no apparent nausea or vomiting Anesthetic complications: no   No notable events documented.  Last Vitals:  Vitals:   03/05/23 0949 03/05/23 0953  BP:  (!) 145/78  Pulse:  72  Resp:  14  Temp:  (!) 36.2 C  SpO2: 99% 99%    Last Pain:  Vitals:   03/05/23 0953  TempSrc: Temporal  PainSc: 0-No pain                 Kaylen Motl S

## 2023-03-05 NOTE — Discharge Instructions (Addendum)
Post Anesthesia Home Care Instructions  Activity: Get plenty of rest for the remainder of the day. A responsible individual must stay with you for 24 hours following the procedure.  For the next 24 hours, DO NOT: -Drive a car -Advertising copywriter -Drink alcoholic beverages -Take any medication unless instructed by your physician -Make any legal decisions or sign important papers.  Meals: Start with liquid foods such as gelatin or soup. Progress to regular foods as tolerated. Avoid greasy, spicy, heavy foods. If nausea and/or vomiting occur, drink only clear liquids until the nausea and/or vomiting subsides. Call your physician if vomiting continues.  Special Instructions/Symptoms: Your throat may feel dry or sore from the anesthesia or the breathing tube placed in your throat during surgery. If this causes discomfort, gargle with warm salt water. The discomfort should disappear within 24 hours.  If you had a scopolamine patch placed behind your ear for the management of post- operative nausea and/or vomiting:  1. The medication in the patch is effective for 72 hours, after which it should be removed.  Wrap patch in a tissue and discard in the trash. Wash hands thoroughly with soap and water. 2. You may remove the patch earlier than 72 hours if you experience unpleasant side effects which may include dry mouth, dizziness or visual disturbances. 3. Avoid touching the patch. Wash your hands with soap and water after contact with the patch.       Regional Anesthesia Blocks  1. Numbness or the inability to move the "blocked" extremity may last from 3-48 hours after placement. The length of time depends on the medication injected and your individual response to the medication. If the numbness is not going away after 48 hours, call your surgeon.  2. The extremity that is blocked will need to be protected until the numbness is gone and the  Strength has returned. Because you cannot feel it, you  will need to take extra care to avoid injury. Because it may be weak, you may have difficulty moving it or using it. You may not know what position it is in without looking at it while the block is in effect.  3. For blocks in the legs and feet, returning to weight bearing and walking needs to be done carefully. You will need to wait until the numbness is entirely gone and the strength has returned. You should be able to move your leg and foot normally before you try and bear weight or walk. You will need someone to be with you when you first try to ensure you do not fall and possibly risk injury.  4. Bruising and tenderness at the needle site are common side effects and will resolve in a few days.  5. Persistent numbness or new problems with movement should be communicated to the surgeon or the [facility] [phone] [facility] [phone]).    Jenny Arthurs, MD [facility]  Please read the following information regarding your care after surgery.  Medications  You only need a prescription for the narcotic pain medicine (ex. oxycodone, Percocet, Norco).  All of the other medicines listed below are available over the counter. ? Aleve 2 pills twice a day for the first 3 days after surgery. ? acetominophen (Tylenol) 650 mg every 4-6 hours as you need for minor to moderate pain ? oxycodone as prescribed for severe pain  Narcotic pain medicine (ex. oxycodone, Percocet, Vicodin) will cause constipation.  To prevent this problem, take the following medicines while  you are taking any pain medicine. ? docusate sodium (Colace) 100 mg twice a day ? senna (Senokot) 2 tablets twice a day  ? To help prevent blood clots, take Xarelto as prescribed for two weeks after surgery.  You should also get up every hour while you are awake to move around.    Weight Bearing ? Do not bear any weight on the operated leg or foot.  Cast / Splint / Dressing ? Keep your splint, cast or  dressing clean and dry.  Don't put anything (coat hanger, pencil, etc) down inside of it.  If it gets damp, use a hair dryer on the cool setting to dry it.  If it gets soaked, call the office to schedule an appointment for a cast change.   After your dressing, cast or splint is removed; you may shower, but do not soak or scrub the wound.  Allow the water to run over it, and then gently pat it dry.  Swelling It is normal for you to have swelling where you had surgery.  To reduce swelling and pain, keep your toes above your nose for at least 3 days after surgery.  It may be necessary to keep your foot or leg elevated for several weeks.  If it hurts, it should be elevated.  Follow Up Call my office at [phone] when you are discharged from the hospital or surgery center to schedule an appointment to be seen two weeks after surgery.  Call my office at [phone] if you develop a fever >101.5 F, nausea, vomiting, bleeding from the surgical site or severe pain.

## 2023-03-05 NOTE — Anesthesia Procedure Notes (Addendum)
Anesthesia Regional Block: Adductor canal block   Pre-Anesthetic Checklist: , timeout performed,  Correct Patient, Correct Site, Correct Laterality,  Correct Procedure, Correct Position, site marked,  Risks and benefits discussed,  Surgical consent,  Pre-op evaluation,  At surgeon's request and post-op pain management  Laterality: Right  Prep: chloraprep       Needles:  Injection technique: Single-shot  Needle Type: Echogenic Needle     Needle Length: 9cm  Needle Gauge: 21     Additional Needles:   Narrative:  Start time: 03/05/2023 7:10 AM End time: 03/05/2023 7:15 AM Injection made incrementally with aspirations every 5 mL.  Performed by: Personally  Anesthesiologist: Achille Rich, MD  Additional Notes: Pt tolerated the procedure well.

## 2023-03-06 ENCOUNTER — Encounter (HOSPITAL_BASED_OUTPATIENT_CLINIC_OR_DEPARTMENT_OTHER): Payer: Self-pay | Admitting: Orthopedic Surgery

## 2023-06-05 ENCOUNTER — Other Ambulatory Visit: Payer: Self-pay | Admitting: Specialist

## 2023-06-05 DIAGNOSIS — Z1231 Encounter for screening mammogram for malignant neoplasm of breast: Secondary | ICD-10-CM

## 2023-07-13 ENCOUNTER — Ambulatory Visit
Admission: RE | Admit: 2023-07-13 | Discharge: 2023-07-13 | Disposition: A | Discharge status: Home / Self Care | Payer: BC Managed Care – PPO | Source: Ambulatory Visit | Attending: Specialist | Admitting: Specialist

## 2023-07-13 DIAGNOSIS — Z1231 Encounter for screening mammogram for malignant neoplasm of breast: Secondary | ICD-10-CM

## 2023-07-16 ENCOUNTER — Other Ambulatory Visit: Payer: Self-pay

## 2023-07-16 ENCOUNTER — Other Ambulatory Visit: Payer: Self-pay | Admitting: Internal Medicine

## 2023-07-16 DIAGNOSIS — R928 Other abnormal and inconclusive findings on diagnostic imaging of breast: Secondary | ICD-10-CM

## 2023-07-20 ENCOUNTER — Ambulatory Visit: Payer: BC Managed Care – PPO

## 2023-07-20 ENCOUNTER — Ambulatory Visit
Admission: RE | Admit: 2023-07-20 | Discharge: 2023-07-20 | Disposition: A | Discharge status: Home / Self Care | Payer: BC Managed Care – PPO | Source: Ambulatory Visit | Attending: Internal Medicine | Admitting: Internal Medicine

## 2023-07-20 DIAGNOSIS — R928 Other abnormal and inconclusive findings on diagnostic imaging of breast: Secondary | ICD-10-CM

## 2023-07-25 ENCOUNTER — Other Ambulatory Visit: Payer: BC Managed Care – PPO

## 2024-04-15 ENCOUNTER — Other Ambulatory Visit: Payer: Self-pay | Admitting: Family Medicine

## 2024-04-15 DIAGNOSIS — Z1231 Encounter for screening mammogram for malignant neoplasm of breast: Secondary | ICD-10-CM

## 2024-07-13 ENCOUNTER — Ambulatory Visit
Admission: RE | Admit: 2024-07-13 | Discharge: 2024-07-13 | Disposition: A | Payer: Self-pay | Source: Ambulatory Visit | Attending: Family Medicine | Admitting: Family Medicine

## 2024-07-13 DIAGNOSIS — Z1231 Encounter for screening mammogram for malignant neoplasm of breast: Secondary | ICD-10-CM

## 2024-07-19 ENCOUNTER — Other Ambulatory Visit: Payer: Self-pay | Admitting: Family Medicine

## 2024-07-19 DIAGNOSIS — R928 Other abnormal and inconclusive findings on diagnostic imaging of breast: Secondary | ICD-10-CM

## 2024-07-28 ENCOUNTER — Ambulatory Visit
Admission: RE | Admit: 2024-07-28 | Discharge: 2024-07-28 | Disposition: A | Discharge status: Home / Self Care | Source: Ambulatory Visit | Attending: Family Medicine | Admitting: Family Medicine

## 2024-07-28 ENCOUNTER — Ambulatory Visit
Admission: RE | Admit: 2024-07-28 | Discharge: 2024-07-28 | Disposition: A | Source: Ambulatory Visit | Attending: Family Medicine | Admitting: Family Medicine

## 2024-07-28 DIAGNOSIS — R928 Other abnormal and inconclusive findings on diagnostic imaging of breast: Secondary | ICD-10-CM
